# Patient Record
Sex: Male | Born: 1975 | Race: White | Marital: Married | Smoking: Never smoker
Health system: Southern US, Community
[De-identification: ages and names within clinical notes are randomized; demographics above are authoritative.]

## PROBLEM LIST (undated history)

## (undated) DIAGNOSIS — G4730 Sleep apnea, unspecified: Secondary | ICD-10-CM

## (undated) DIAGNOSIS — F458 Other somatoform disorders: Secondary | ICD-10-CM

## (undated) DIAGNOSIS — G4733 Obstructive sleep apnea (adult) (pediatric): Secondary | ICD-10-CM

## (undated) DIAGNOSIS — I10 Essential (primary) hypertension: Secondary | ICD-10-CM

## (undated) DIAGNOSIS — E291 Testicular hypofunction: Secondary | ICD-10-CM

## (undated) DIAGNOSIS — Z87442 Personal history of urinary calculi: Secondary | ICD-10-CM

## (undated) DIAGNOSIS — M722 Plantar fascial fibromatosis: Secondary | ICD-10-CM

## (undated) DIAGNOSIS — K5090 Crohn's disease, unspecified, without complications: Secondary | ICD-10-CM

## (undated) DIAGNOSIS — Z973 Presence of spectacles and contact lenses: Secondary | ICD-10-CM

## (undated) DIAGNOSIS — N201 Calculus of ureter: Secondary | ICD-10-CM

## (undated) DIAGNOSIS — Z8709 Personal history of other diseases of the respiratory system: Secondary | ICD-10-CM

## (undated) DIAGNOSIS — N200 Calculus of kidney: Secondary | ICD-10-CM

## (undated) DIAGNOSIS — J309 Allergic rhinitis, unspecified: Secondary | ICD-10-CM

## (undated) HISTORY — DX: Plantar fascial fibromatosis: M72.2

## (undated) HISTORY — PX: HAND SURGERY: SHX662

## (undated) HISTORY — DX: Other somatoform disorders: F45.8

## (undated) HISTORY — DX: Personal history of urinary calculi: Z87.442

## (undated) HISTORY — DX: Crohn's disease, unspecified, without complications: K50.90

## (undated) HISTORY — PX: TYMPANOSTOMY TUBE PLACEMENT: SHX32

## (undated) HISTORY — PX: NASAL SINUS SURGERY: SHX719

## (undated) HISTORY — DX: Essential (primary) hypertension: I10

## (undated) HISTORY — DX: Sleep apnea, unspecified: G47.30

## (undated) HISTORY — PX: ADENOIDECTOMY: SUR15

---

## 1999-08-28 ENCOUNTER — Encounter: Payer: Self-pay | Admitting: Emergency Medicine

## 1999-08-28 ENCOUNTER — Emergency Department (HOSPITAL_COMMUNITY): Admission: EM | Admit: 1999-08-28 | Discharge: 1999-08-28 | Payer: Self-pay | Admitting: Emergency Medicine

## 1999-09-18 ENCOUNTER — Ambulatory Visit (HOSPITAL_COMMUNITY): Admission: RE | Admit: 1999-09-18 | Discharge: 1999-09-18 | Payer: Self-pay | Admitting: Orthopedic Surgery

## 1999-09-19 ENCOUNTER — Encounter: Payer: Self-pay | Admitting: Orthopedic Surgery

## 1999-09-23 ENCOUNTER — Ambulatory Visit (HOSPITAL_COMMUNITY): Admission: RE | Admit: 1999-09-23 | Discharge: 1999-09-23 | Payer: Self-pay | Admitting: Orthopedic Surgery

## 1999-09-23 ENCOUNTER — Encounter: Payer: Self-pay | Admitting: Orthopedic Surgery

## 2000-06-23 ENCOUNTER — Emergency Department (HOSPITAL_COMMUNITY): Admission: EM | Admit: 2000-06-23 | Discharge: 2000-06-23 | Payer: Self-pay | Admitting: Emergency Medicine

## 2000-06-23 ENCOUNTER — Encounter: Payer: Self-pay | Admitting: Emergency Medicine

## 2001-10-04 ENCOUNTER — Emergency Department (HOSPITAL_COMMUNITY): Admission: EM | Admit: 2001-10-04 | Discharge: 2001-10-04 | Payer: Self-pay | Admitting: Emergency Medicine

## 2001-10-04 ENCOUNTER — Encounter: Payer: Self-pay | Admitting: Emergency Medicine

## 2006-06-04 ENCOUNTER — Ambulatory Visit: Payer: Self-pay | Admitting: Internal Medicine

## 2006-06-04 LAB — CONVERTED CEMR LAB
ALT: 29 units/L (ref 0–40)
AST: 25 units/L (ref 0–37)
Albumin: 3.6 g/dL (ref 3.5–5.2)
Alkaline Phosphatase: 85 units/L (ref 39–117)
BUN: 10 mg/dL (ref 6–23)
Basophils Absolute: 0 10*3/uL (ref 0.0–0.1)
Basophils Relative: 0.3 % (ref 0.0–1.0)
Bilirubin Urine: NEGATIVE
Bilirubin, Direct: 0.1 mg/dL (ref 0.0–0.3)
CO2: 30 meq/L (ref 19–32)
Calcium: 9.3 mg/dL (ref 8.4–10.5)
Chloride: 108 meq/L (ref 96–112)
Cholesterol: 261 mg/dL (ref 0–200)
Creatinine, Ser: 1 mg/dL (ref 0.4–1.5)
Direct LDL: 207.7 mg/dL
Eosinophils Absolute: 0.2 10*3/uL (ref 0.0–0.6)
Eosinophils Relative: 2.5 % (ref 0.0–5.0)
GFR calc Af Amer: 111 mL/min
GFR calc non Af Amer: 91 mL/min
Glucose, Bld: 98 mg/dL (ref 70–99)
HCT: 43.9 % (ref 39.0–52.0)
HDL: 37.7 mg/dL — ABNORMAL LOW (ref >39.0)
Hemoglobin, Urine: NEGATIVE
Hemoglobin: 15.5 g/dL (ref 13.0–17.0)
Ketones, ur: NEGATIVE mg/dL
Leukocytes, UA: NEGATIVE
Lymphocytes Relative: 36.1 % (ref 12.0–46.0)
MCHC: 35.2 g/dL (ref 30.0–36.0)
MCV: 82.3 fL (ref 78.0–100.0)
Monocytes Absolute: 0.3 10*3/uL (ref 0.2–0.7)
Monocytes Relative: 4.6 % (ref 3.0–11.0)
Neutro Abs: 3.7 10*3/uL (ref 1.4–7.7)
Neutrophils Relative %: 56.5 % (ref 43.0–77.0)
Nitrite: NEGATIVE
Platelets: 383 10*3/uL (ref 150–400)
Potassium: 4.5 meq/L (ref 3.5–5.1)
RBC: 5.33 M/uL (ref 4.22–5.81)
RDW: 12.1 % (ref 11.5–14.6)
Sodium: 141 meq/L (ref 135–145)
Specific Gravity, Urine: 1.025 (ref 1.000–1.03)
TSH: 1.53 microintl units/mL (ref 0.35–5.50)
Total Bilirubin: 0.8 mg/dL (ref 0.3–1.2)
Total CHOL/HDL Ratio: 6.9
Total Protein, Urine: NEGATIVE mg/dL
Total Protein: 7.5 g/dL (ref 6.0–8.3)
Triglycerides: 101 mg/dL (ref 0–149)
Urine Glucose: NEGATIVE mg/dL
Urobilinogen, UA: 0.2 (ref 0.0–1.0)
VLDL: 20 mg/dL (ref 0–40)
WBC: 6.6 10*3/uL (ref 4.5–10.5)
pH: 6.5 (ref 5.0–8.0)

## 2006-09-03 ENCOUNTER — Ambulatory Visit: Payer: Self-pay | Admitting: Internal Medicine

## 2007-01-07 ENCOUNTER — Ambulatory Visit: Payer: Self-pay | Admitting: Internal Medicine

## 2007-08-13 ENCOUNTER — Emergency Department (HOSPITAL_COMMUNITY): Admission: EM | Admit: 2007-08-13 | Discharge: 2007-08-14 | Payer: Self-pay | Admitting: Emergency Medicine

## 2007-10-26 ENCOUNTER — Encounter: Payer: Self-pay | Admitting: Internal Medicine

## 2007-10-26 ENCOUNTER — Telehealth: Payer: Self-pay | Admitting: Internal Medicine

## 2007-10-26 DIAGNOSIS — J309 Allergic rhinitis, unspecified: Secondary | ICD-10-CM | POA: Insufficient documentation

## 2007-10-26 DIAGNOSIS — I10 Essential (primary) hypertension: Secondary | ICD-10-CM | POA: Insufficient documentation

## 2007-12-08 ENCOUNTER — Ambulatory Visit: Payer: Self-pay | Admitting: Internal Medicine

## 2007-12-08 DIAGNOSIS — R51 Headache: Secondary | ICD-10-CM | POA: Insufficient documentation

## 2007-12-08 DIAGNOSIS — R519 Headache, unspecified: Secondary | ICD-10-CM | POA: Insufficient documentation

## 2007-12-08 DIAGNOSIS — D485 Neoplasm of uncertain behavior of skin: Secondary | ICD-10-CM | POA: Insufficient documentation

## 2007-12-10 LAB — CONVERTED CEMR LAB
ALT: 25 units/L (ref 0–53)
AST: 26 units/L (ref 0–37)
Albumin: 3.9 g/dL (ref 3.5–5.2)
Alkaline Phosphatase: 88 units/L (ref 39–117)
BUN: 14 mg/dL (ref 6–23)
Basophils Absolute: 0 10*3/uL (ref 0.0–0.1)
Basophils Relative: 0.7 % (ref 0.0–3.0)
Bilirubin Urine: NEGATIVE
Bilirubin, Direct: 0.1 mg/dL (ref 0.0–0.3)
CO2: 28 meq/L (ref 19–32)
Calcium: 9.4 mg/dL (ref 8.4–10.5)
Chloride: 107 meq/L (ref 96–112)
Cholesterol: 244 mg/dL (ref 0–200)
Creatinine, Ser: 1.1 mg/dL (ref 0.4–1.5)
Direct LDL: 196 mg/dL
Eosinophils Absolute: 0.1 10*3/uL (ref 0.0–0.7)
Eosinophils Relative: 1.8 % (ref 0.0–5.0)
GFR calc Af Amer: 99 mL/min
GFR calc non Af Amer: 81 mL/min
Glucose, Bld: 105 mg/dL — ABNORMAL HIGH (ref 70–99)
HCT: 42.6 % (ref 39.0–52.0)
HDL: 31.8 mg/dL — ABNORMAL LOW (ref >39.0)
Hemoglobin, Urine: NEGATIVE
Hemoglobin: 14.7 g/dL (ref 13.0–17.0)
Ketones, ur: NEGATIVE mg/dL
Leukocytes, UA: NEGATIVE
Lymphocytes Relative: 36.4 % (ref 12.0–46.0)
MCHC: 34.5 g/dL (ref 30.0–36.0)
MCV: 82.6 fL (ref 78.0–100.0)
Monocytes Absolute: 0.3 10*3/uL (ref 0.1–1.0)
Monocytes Relative: 5.2 % (ref 3.0–12.0)
Neutro Abs: 3.6 10*3/uL (ref 1.4–7.7)
Neutrophils Relative %: 55.9 % (ref 43.0–77.0)
Nitrite: NEGATIVE
Platelets: 387 10*3/uL (ref 150–400)
Potassium: 4 meq/L (ref 3.5–5.1)
RBC: 5.16 M/uL (ref 4.22–5.81)
RDW: 12.2 % (ref 11.5–14.6)
Sodium: 143 meq/L (ref 135–145)
Specific Gravity, Urine: >=1.03 (ref 1.000–1.03)
TSH: 1.57 microintl units/mL (ref 0.35–5.50)
Total Bilirubin: 0.9 mg/dL (ref 0.3–1.2)
Total CHOL/HDL Ratio: 7.7
Total Protein, Urine: NEGATIVE mg/dL
Total Protein: 7.9 g/dL (ref 6.0–8.3)
Triglycerides: 90 mg/dL (ref 0–149)
Urine Glucose: NEGATIVE mg/dL
Urobilinogen, UA: 0.2 (ref 0.0–1.0)
VLDL: 18 mg/dL (ref 0–40)
WBC: 6.3 10*3/uL (ref 4.5–10.5)
pH: 5 (ref 5.0–8.0)

## 2008-01-25 ENCOUNTER — Telehealth: Payer: Self-pay | Admitting: Internal Medicine

## 2008-04-29 DIAGNOSIS — M722 Plantar fascial fibromatosis: Secondary | ICD-10-CM

## 2008-04-29 HISTORY — DX: Plantar fascial fibromatosis: M72.2

## 2008-05-30 ENCOUNTER — Ambulatory Visit: Payer: Self-pay | Admitting: Internal Medicine

## 2008-08-16 ENCOUNTER — Encounter: Payer: Self-pay | Admitting: Internal Medicine

## 2008-12-06 ENCOUNTER — Ambulatory Visit: Payer: Self-pay | Admitting: Internal Medicine

## 2008-12-06 DIAGNOSIS — M722 Plantar fascial fibromatosis: Secondary | ICD-10-CM | POA: Insufficient documentation

## 2008-12-06 DIAGNOSIS — H679 Otitis media in diseases classified elsewhere, unspecified ear: Secondary | ICD-10-CM | POA: Insufficient documentation

## 2008-12-08 ENCOUNTER — Telehealth: Payer: Self-pay | Admitting: Family Medicine

## 2008-12-08 ENCOUNTER — Telehealth: Payer: Self-pay | Admitting: Internal Medicine

## 2008-12-19 ENCOUNTER — Telehealth: Payer: Self-pay | Admitting: Internal Medicine

## 2008-12-20 ENCOUNTER — Telehealth: Payer: Self-pay | Admitting: Internal Medicine

## 2009-01-12 ENCOUNTER — Telehealth: Payer: Self-pay | Admitting: Internal Medicine

## 2009-06-05 ENCOUNTER — Telehealth: Payer: Self-pay | Admitting: Internal Medicine

## 2009-07-17 ENCOUNTER — Ambulatory Visit (HOSPITAL_BASED_OUTPATIENT_CLINIC_OR_DEPARTMENT_OTHER): Admission: RE | Admit: 2009-07-17 | Discharge: 2009-07-17 | Payer: Self-pay | Admitting: Otolaryngology

## 2009-07-17 HISTORY — PX: ETHMOIDECTOMY: SHX5197

## 2009-09-04 ENCOUNTER — Ambulatory Visit: Payer: Self-pay | Admitting: Internal Medicine

## 2009-09-04 DIAGNOSIS — B079 Viral wart, unspecified: Secondary | ICD-10-CM | POA: Insufficient documentation

## 2009-09-04 DIAGNOSIS — R5383 Other fatigue: Secondary | ICD-10-CM | POA: Insufficient documentation

## 2009-09-04 DIAGNOSIS — R635 Abnormal weight gain: Secondary | ICD-10-CM | POA: Insufficient documentation

## 2009-09-04 DIAGNOSIS — R002 Palpitations: Secondary | ICD-10-CM | POA: Insufficient documentation

## 2009-09-04 DIAGNOSIS — R5381 Other malaise: Secondary | ICD-10-CM | POA: Insufficient documentation

## 2009-09-04 DIAGNOSIS — A630 Anogenital (venereal) warts: Secondary | ICD-10-CM | POA: Insufficient documentation

## 2009-09-04 LAB — CONVERTED CEMR LAB
ALT: 25 units/L (ref 0–53)
AST: 22 units/L (ref 0–37)
Albumin: 3.7 g/dL (ref 3.5–5.2)
Alkaline Phosphatase: 77 units/L (ref 39–117)
BUN: 16 mg/dL (ref 6–23)
Basophils Absolute: 0 10*3/uL (ref 0.0–0.1)
Basophils Relative: 0.6 % (ref 0.0–3.0)
Chloride: 107 meq/L (ref 96–112)
Eosinophils Relative: 3.1 % (ref 0.0–5.0)
GFR calc non Af Amer: 132.76 mL/min (ref >60)
HCT: 42.4 % (ref 39.0–52.0)
Hemoglobin, Urine: NEGATIVE
Hemoglobin: 14.5 g/dL (ref 13.0–17.0)
Ketones, ur: NEGATIVE mg/dL
Lymphocytes Relative: 34.2 % (ref 12.0–46.0)
Lymphs Abs: 2.4 10*3/uL (ref 0.7–4.0)
Monocytes Relative: 7.1 % (ref 3.0–12.0)
Neutro Abs: 3.8 10*3/uL (ref 1.4–7.7)
Potassium: 4.3 meq/L (ref 3.5–5.1)
RBC: 5.05 M/uL (ref 4.22–5.81)
RDW: 13.4 % (ref 11.5–14.6)
Sed Rate: 10 mm/hr (ref 0–22)
Sodium: 142 meq/L (ref 135–145)
Total Protein, Urine: NEGATIVE mg/dL
Total Protein: 7.3 g/dL (ref 6.0–8.3)
Urine Glucose: NEGATIVE mg/dL
Urobilinogen, UA: 0.2 (ref 0.0–1.0)
Vitamin B-12: 443 pg/mL (ref 211–911)
WBC: 6.9 10*3/uL (ref 4.5–10.5)

## 2009-09-05 LAB — CONVERTED CEMR LAB: Vit D, 25-Hydroxy: 21 ng/mL — ABNORMAL LOW (ref 30–89)

## 2009-09-06 ENCOUNTER — Ambulatory Visit: Payer: Self-pay | Admitting: Pulmonary Disease

## 2009-10-02 ENCOUNTER — Ambulatory Visit: Payer: Self-pay | Admitting: Psychology

## 2009-10-04 ENCOUNTER — Encounter: Payer: Self-pay | Admitting: Pulmonary Disease

## 2009-10-04 ENCOUNTER — Ambulatory Visit (HOSPITAL_BASED_OUTPATIENT_CLINIC_OR_DEPARTMENT_OTHER): Admission: RE | Admit: 2009-10-04 | Discharge: 2009-10-04 | Payer: Self-pay | Admitting: Pulmonary Disease

## 2009-10-05 ENCOUNTER — Ambulatory Visit: Payer: Self-pay | Admitting: Pulmonary Disease

## 2009-10-16 ENCOUNTER — Ambulatory Visit: Payer: Self-pay | Admitting: Pulmonary Disease

## 2009-10-16 DIAGNOSIS — G4733 Obstructive sleep apnea (adult) (pediatric): Secondary | ICD-10-CM | POA: Insufficient documentation

## 2009-10-16 DIAGNOSIS — F458 Other somatoform disorders: Secondary | ICD-10-CM | POA: Insufficient documentation

## 2009-11-13 ENCOUNTER — Ambulatory Visit: Payer: Self-pay | Admitting: Internal Medicine

## 2009-11-13 DIAGNOSIS — L219 Seborrheic dermatitis, unspecified: Secondary | ICD-10-CM | POA: Insufficient documentation

## 2009-12-05 ENCOUNTER — Encounter: Payer: Self-pay | Admitting: Pulmonary Disease

## 2009-12-26 ENCOUNTER — Telehealth: Payer: Self-pay | Admitting: Pulmonary Disease

## 2010-01-09 ENCOUNTER — Telehealth: Payer: Self-pay | Admitting: Internal Medicine

## 2010-01-11 ENCOUNTER — Encounter: Payer: Self-pay | Admitting: Pulmonary Disease

## 2010-01-16 ENCOUNTER — Encounter (INDEPENDENT_AMBULATORY_CARE_PROVIDER_SITE_OTHER): Payer: Self-pay | Admitting: *Deleted

## 2010-01-16 ENCOUNTER — Telehealth (INDEPENDENT_AMBULATORY_CARE_PROVIDER_SITE_OTHER): Payer: Self-pay | Admitting: *Deleted

## 2010-02-13 ENCOUNTER — Telehealth: Payer: Self-pay | Admitting: Pulmonary Disease

## 2010-02-20 ENCOUNTER — Telehealth: Payer: Self-pay | Admitting: Internal Medicine

## 2010-02-21 ENCOUNTER — Ambulatory Visit: Payer: Self-pay | Admitting: Psychology

## 2010-02-22 ENCOUNTER — Telehealth: Payer: Self-pay | Admitting: Pulmonary Disease

## 2010-03-05 ENCOUNTER — Ambulatory Visit: Payer: Self-pay | Admitting: Psychology

## 2010-04-10 ENCOUNTER — Telehealth: Payer: Self-pay | Admitting: Internal Medicine

## 2010-04-16 ENCOUNTER — Encounter: Payer: Self-pay | Admitting: Sports Medicine

## 2010-04-16 ENCOUNTER — Ambulatory Visit: Payer: Self-pay | Admitting: Internal Medicine

## 2010-04-20 ENCOUNTER — Encounter (INDEPENDENT_AMBULATORY_CARE_PROVIDER_SITE_OTHER): Payer: Self-pay | Admitting: *Deleted

## 2010-04-29 HISTORY — PX: COLONOSCOPY: SHX174

## 2010-05-08 ENCOUNTER — Ambulatory Visit: Admit: 2010-05-08 | Payer: Self-pay | Admitting: Sports Medicine

## 2010-05-29 NOTE — Progress Notes (Signed)
----   Converted from flag ---- ---- 01/16/2010 7:38 AM, Evie Lacks Plotnikov MD wrote: ok Thx  ---- 01/12/2010 11:01 AM, Ophelia Charter wrote: Dr Wilhemina Bonito is scheduling into nov, can pt wait until then?  ---- 01/11/2010 12:44 PM, Evie Lacks Plotnikov MD wrote: The following orders have been entered for this patient and placed on Admin Hold:  Type:     Referral       Code:   Derma Description:   Dermatology Referral Order Date:   01/09/2010   Authorized By:   Cassandria Anger MD Order #:   [id number] Clinical Notes:   Dr Wilhemina Bonito Dx rash ------------------------------

## 2010-05-29 NOTE — Progress Notes (Signed)
Summary: REFERRAL  Phone Note Other Incoming   Caller: Pt Summary of Call: Pt is requesting ref to a dermatologist, Called pt to see what he needs ref for. lmovm for pt to call back Initial call taken by: Ami Bullins CMA,  January 09, 2010 8:54 AM  Follow-up for Phone Call        Left vm for pt to call office back w/details of referral Follow-up by: Charlsie Quest, CMA,  January 09, 2010 6:18 PM  Additional Follow-up for Phone Call Additional follow up Details #1::        Patient is requesting a referal to the "Best dermatologist in [place]" He has been to a dermatologist many years ago for frequent "skin issues" Last office visit he was given rx for cream for dermatitis but wants dermatologist. Please advise.  Additional Follow-up by: Charlsie Quest, CMA,  January 10, 2010 6:55 PM    Additional Follow-up for Phone Call Additional follow up Details #2::    ok Follow-up by: Cassandria Anger MD,  January 11, 2010 12:43 PM

## 2010-05-29 NOTE — Progress Notes (Signed)
Summary: nos appt  Phone Note Call from Patient   Caller: [email or web address]  Call For: sood Summary of Call: Rsc nos from 8/29 to 10/17 @ 3:45p. Initial call taken by: Netta Neat,  December 26, 2009 10:24 AM

## 2010-05-29 NOTE — Progress Notes (Signed)
Summary: nos appt  Phone Note Call from Patient   Caller: [email or web address]  Call For: sood Summary of Call: Rsc nos from 10/17 to 10/26. Initial call taken by: Netta Neat,  February 13, 2010 9:57 AM

## 2010-05-29 NOTE — Progress Notes (Signed)
Summary: MICARDIS  Phone Note Call from Patient Call back at Home Phone [phone]   Caller: Patient Call For: Cassandria Anger MD Summary of Call: Pt states he went to his pharmacy [facility] on [address] does NOT have Micardis ready, pt is requesting this be re-sent as it is not ready. Initial call taken by: Denice Paradise,  June 05, 2009 1:06 PM  Follow-up for Phone Call        Called pt and pharm, pharm will fill rx Follow-up by: Charlsie Quest, [place],  June 05, 2009 2:10 PM

## 2010-05-29 NOTE — Assessment & Plan Note (Signed)
Summary: WAKING UP AT NIGHT-HEADACHE FATIGUE-STC   Vital Signs:  Patient profile:   35 year old male Height:      73 inches Weight:      276.50 pounds BMI:     36.61 O2 Sat:      98 % on Room air Temp:     97.6 degrees F oral Pulse rate:   77 / minute BP sitting:   138 / 92  (right arm) Cuff size:   large  Vitals Entered By: Ernestene Mention (Sep 04, 2009 8:16 AM)  O2 Flow:  Room air CC: C/O waking up 8-10x per night. Pt states he will awaken with HA and later go back to sleep./kb Is Patient Diabetic? No Pain Assessment Patient in pain? no        Primary Care Provider:  Evie Lacks Plotnikov MD  CC:  C/O waking up 8-10x per night. Pt states he will awaken with HA and later go back to sleep./kb.  History of Present Illness: C/o fatigued, snoring F had PTSD - [facility] C/o penile wart  Current Medications (verified): 1)  Micardis Hct 40-12.5 Mg Tabs (Telmisartan-Hctz) .[name].. 1 By Mouth Qd 2)  Condylox 0.5 % Gel (Podofilox) .... Apply To Skin Lesion Once Daily For 14 Days 3)  Augmentin 500-125 Mg Tabs (Amoxicillin-Pot Clavulanate) .... One By Mouth Three Times A Day For 10 Days 4)  Auralgan  Soln (Antipyrine-Benzocaine-Polycos) .... 2 Drops Into Left Ear Three Times A Day As Needed For Pain 5)  Lomotil 2.5-0.025 Mg Tabs (Diphenoxylate-Atropine) .[name].. 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    Physical Exam  General:  alert,  well-developed, well-nourished, well-hydrated, and good hygiene.   Nose:  nasal dischargemucosal pallor.   Mouth:  Oral mucosa and oropharynx without lesions or exudates.  Teeth in good repair. Neck:  No deformities, masses, or tenderness noted. Lungs:  Normal respiratory effort, chest expands symmetrically. Lungs are clear to auscultation, no crackles or wheezes. Heart:  Normal rate and regular rhythm. S1 and S2 normal without gallop, murmur, click, rub or other extra sounds. Abdomen:  Bowel sounds positive,abdomen soft and non-tender without masses, organomegaly or hernias noted. Genitalia:  penile wart Msk:  No deformity or scoliosis noted of thoracic or lumbar spine.   Extremities:  No clubbing, cyanosis, edema, or deformity noted with normal full range of motion of all joints.   Skin:  he has mild patches of sclaing and hypopigmentation on his face. Psych:  Cognition and judgment appear intact. Alert and cooperative with normal attention span and concentration. No apparent delusions, illusions, hallucinations   Impression & Recommendations:  Problem # 1:  FATIGUE (ICD-780.79) Assessment New R/o OSA Orders: Sleep Disorder Referral (Sleep Disorder)  Problem # 2:  SNORING (ICD-786.09) Assessment: Deteriorated  The following medications were removed from the medication list:    Micardis Hct 40-12.5 Mg Tabs (Telmisartan-hctz) .[name]... 1 by mouth qd His updated medication list for this problem includes:    Losartan Potassium-hctz 100-25 Mg Tabs (Losartan potassium-hctz) .Marland KitchenMarland KitchenMarland KitchenMarland Kitchen  1 by mouth once daily for blood pressure  Orders: TLB-BMP (Basic Metabolic Panel-BMET) (83818-MCRFVOH) TLB-B12, Serum-Total ONLY (60677-C34) TLB-CBC Platelet - w/Differential (85025-CBCD) TLB-Hepatic/Liver Function Pnl (80076-HEPATIC) TLB-Sedimentation Rate (ESR) (85652-ESR) TLB-TSH (Thyroid Stimulating Hormone) (84443-TSH) T-Vitamin D (25-Hydroxy) (03524-81859) TLB-Udip ONLY (81003-UDIP) Sleep Disorder  Referral (Sleep Disorder)  Problem # 3:  PALPITATIONS (ICD-785.1) Assessment: Comment Only  Orders: TLB-BMP (Basic Metabolic Panel-BMET) (09311-[id number]) TLB-B12, Serum-Total ONLY (95072-U57) TLB-CBC Platelet - w/Differential (85025-CBCD) TLB-Hepatic/Liver Function Pnl (80076-HEPATIC) TLB-Sedimentation Rate (ESR) (85652-ESR) TLB-TSH (Thyroid Stimulating Hormone) (84443-TSH) T-Vitamin D (25-Hydroxy) (50518-33582) TLB-Udip ONLY (81003-UDIP) EKG w/ Interpretation (93000)  Problem # 4:  WEIGHT GAIN, ABNORMAL (ICD-783.1) Assessment: Deteriorated See "Patient Instructions".  Orders: TLB-BMP (Basic Metabolic Panel-BMET) ([id number]) TLB-B12, Serum-Total ONLY (81188-Q77) TLB-CBC Platelet - w/Differential (85025-CBCD) TLB-Hepatic/Liver Function Pnl (80076-HEPATIC) TLB-Sedimentation Rate (ESR) (85652-ESR) TLB-TSH (Thyroid Stimulating Hormone) (84443-TSH) T-Vitamin D (25-Hydroxy) (37366-81594) TLB-Udip ONLY (81003-UDIP)  Problem # 5:  WART, VIRAL (ICD-078.10) penile Assessment: New  Orders: Wart Destruct <14 (17110) Procedure: cryo Indication: wart(s) x 1 6x4 mm on distal R penile shaft skin Risks incl. scar(s), incomplete removal, ect.  and benefits discussed     1  lesion(s) on the above area was/were treated with liqid N2 in usual fasion.  Tolerated well. Compl. none. Wound care instructions given.  Problem # 6:  STRESS DISORDER (ICD-V62.89) Assessment: Comment Only  Orders: Psychology Referral (Psychology) [facility]  Complete Medication List: 1)  Losartan Potassium-hctz 100-25 Mg Tabs (Losartan potassium-hctz) .[name].. 1 by mouth once daily for blood pressure 2)  Vitamin D 1000 Unit Tabs (Cholecalciferol) .[name].. 1 by mouth qd 3)  Ketoconazole 2 % Crea (Ketoconazole) .... Use bid  Patient Instructions: 1)  Please schedule a follow-up appointment in 2 months. 2)  Try to eat more raw plant food, fresh and dry fruit, raw almonds, leafy vegetables, whole foods and less red  meat, less animal fat. Poultry and fish is better for you than pork and beef. Avoid processed foods (canned soups, hot dogs, sausage, bacon , frozen dinners). Avoid corn syrup, high fructose syrup or aspartam  Make your own  dressing with olive oil, wine vinegar, lemon juce, garlic etc. for your salads.  3)  Loose 10 lbs Prescriptions: KETOCONAZOLE 2 % CREA (KETOCONAZOLE) use bid  #90 g x 3   Entered and Authorized by:   Cassandria Anger MD   Signed by:   Cassandria Anger MD on 09/04/2009   Method used:   Print then Give to Patient   RxID:   [id number] VITAMIN D (ERGOCALCIFEROL) 50000 UNIT CAPS (ERGOCALCIFEROL) 1 by mouth q 1 week x 6 weeks then start Vit D 1000 international units qd  #6 x 0   Entered and Authorized by:   Cassandria Anger MD   Signed by:   Cassandria Anger MD on 09/04/2009   Method used:   Print then Give to Patient   RxID:   [id number] LOSARTAN POTASSIUM-HCTZ 100-25 MG TABS (LOSARTAN POTASSIUM-HCTZ) 1 by mouth once daily for blood pressure  #90 x 3   Entered and Authorized by:   Cassandria Anger MD   Signed by:   Cassandria Anger MD on 09/04/2009   Method used:   Print then Give to Patient   RxID:   [id number]

## 2010-05-29 NOTE — Progress Notes (Signed)
Summary: RX?  Phone Note Call from Patient Call back at [facility] Phone [phone]   Summary of Call: Pt feels that he has acid reflux and wants suggestion from MD Initial call taken by: Charlsie Quest, [place],  February 20, 2010 2:12 PM  Follow-up for Phone Call        OTC Prilosec 1 a day Avoid greasy food OV if problems Follow-up by: Cassandria Anger MD,  February 20, 2010 5:53 PM  Additional Follow-up for Phone Call Additional follow up Details #1::        Pt informed  Additional Follow-up by: Charlsie Quest, [place],  February 21, 2010 11:08 AM

## 2010-05-29 NOTE — Assessment & Plan Note (Signed)
Summary: 2 MO ROV/ NWS   Vital Signs:  Patient profile:   35 year old male Height:      73 inches (185.42 cm) Weight:      273 pounds (124.09 kg) BMI:     36.15 O2 Sat:      97 % on Room air Temp:     98.2 degrees F (36.78 degrees C) oral Pulse rate:   96 / minute Pulse rhythm:   regular Resp:     16 per minute BP sitting:   122 / 82  (left arm) Cuff size:   large  Vitals Entered By: Jonathon Resides, CMA(AAMA) (November 13, 2009 10:43 AM)  O2 Flow:  Room air CC: 2 mo f/u Is Patient Diabetic? No   Primary Care Provider:  Cassandria Anger MD  CC:  2 mo f/u.  History of Present Illness: F/u OSA, penile wart, HTN C/o scaly rash on face off and on x years  Current Medications (verified): 1)  Losartan Potassium-Hctz 100-25 Mg Tabs (Losartan Potassium-Hctz) .[name].. 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    Physical Exam  General:  alert, well-developed, well-nourished, well-hydrated, and good hygiene.   Ears:  R ear normal, L TM erythema, and L TM bulging.   Mouth:  Oral mucosa and oropharynx without lesions or exudates.  Teeth in good repair. Neck:  No deformities, masses, or tenderness noted. Lungs:  Normal respiratory effort, chest expands symmetrically. Lungs are clear to auscultation, no crackles or wheezes. Heart:  Normal rate and regular rhythm. S1 and S2 normal without gallop, murmur, click, rub or other extra sounds. Abdomen:  Bowel sounds positive,abdomen soft and non-tender without  masses, organomegaly or hernias noted. Msk:  No deformity or scoliosis noted of thoracic or lumbar spine.   Extremities:  No clubbing, cyanosis, edema, or deformity noted with normal full range of motion of all joints.   Skin:  he has mild patches of sclaing and hypopigmentation on his face. Psych:  Cognition and judgment appear intact. Alert and cooperative with normal attention span and concentration. No apparent delusions, illusions, hallucinations   Impression & Recommendations:  Problem # 1:  SEBORRHEIC DERMATITIS (ICD-690.10) Assessment New Lotrisone cream  Problem # 2:  FATIGUE (ICD-780.79) Assessment: Improved  Problem # 3:  HYPERTENSION (ICD-401.9) Assessment: New  The following medications were removed from the medication list:    Losartan Potassium-hctz 100-25 Mg Tabs (Losartan potassium-hctz) .[name]... 1 by mouth once daily for blood pressure His updated medication list for this problem includes:    Cozaar 100 Mg Tabs (Losartan potassium) .[name]... 1 by mouth qd  Problem # 4:  OBSTRUCTIVE SLEEP APNEA (ICD-327.23) Assessment: Improved CPAP  Problem # 5:  WART, VIRAL (ICD-078.10) Assessment: Improved  Complete Medication List: 1)  Cozaar 100 Mg Tabs (Losartan potassium) .[name].. 1 by mouth qd 2)  Vitamin D (ergocalciferol) 50000 Unit Caps (Ergocalciferol) .[name].. 1 by mouth q 1 week x 6 weeks then start vit d 1000 international units qd 3)  Vitamin D 1000 Unit Tabs (Cholecalciferol) .[name].. 1 by mouth  qd 4)  Cialis 20 Mg Tabs (Tadalafil) .... 1/2 or 1 by mouth q 1-3 d prn 5)  Lotrisone 1-0.05 % Crea (Clotrimazole-betamethasone) .... Use bid  Patient Instructions: 1)  Please schedule a follow-up appointment in 4 months. Prescriptions: LOTRISONE 1-0.05 % CREA (CLOTRIMAZOLE-BETAMETHASONE) use bid  #90 g x 1   Entered and Authorized by:   Cassandria Anger MD   Signed by:   Cassandria Anger MD on 11/13/2009   Method used:   Print then Give to Patient   RxID:    [id number] COZAAR 100 MG TABS (LOSARTAN POTASSIUM) 1 by mouth qd  #90 x 3   Entered and Authorized by:   Cassandria Anger MD   Signed by:   Cassandria Anger MD on 11/13/2009   Method used:   Print then Give to Patient   RxID:   [id number] CIALIS 20 MG TABS (TADALAFIL) 1/2 or 1 by mouth q 1-3 d prn  #6 x 12   Entered and Authorized by:   Cassandria Anger MD   Signed by:   Cassandria Anger MD on 11/13/2009   Method used:   Print then Give to Patient   RxID:   [id number] VITAMIN D (ERGOCALCIFEROL) 50000 UNIT CAPS (ERGOCALCIFEROL) 1 by mouth q 1 week x 6 weeks then start Vit D 1000 international units qd  #6 x 0   Entered and Authorized by:   Cassandria Anger MD   Signed by:   Cassandria Anger MD on 11/13/2009   Method used:   Print then Give to Patient   RxID:   [id number]

## 2010-05-29 NOTE — Assessment & Plan Note (Signed)
Summary: SNORING ///kp   Primary Provider/Referring Provider:  Cassandria Anger MD  CC:  Sleep consult, pt states his wife says he snores extremely bad and he stops breathing during the night, and pt state he wakes up with severe heacdaches in the AM.  History of Present Illness: 35 yo male for sleep evaluation  He has always had problems with his sleep.  His wife has told him he snores, and stops breathing while asleep.  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  He goes to bed at 11pm and wakes up at 630 am.  He wakes up frequently with a start, and has heard himself snore.  He feels tired in the morning, and gets headaches in the morning.  He falls asleep easily when sitting quiet.    He has tried using hydrocodone to help him sleep.  He does not drink much alcohol, and does not smoke.  He drinks lots of soda during the day, and uses energy drinks also.  He has gained 25 lbs over the past year.  He denies sleep talking, sleep walking, or bruxism.  There is no history of restless legs.  He denies sleep hallucinations, sleep paralysis, or cataplexy.  His father had PTSD, and apparently there was some conflict in Mr. Zupko's relation with his father.  He gets frequent nigthmares about these interactions.  He has been scheduled to see a psychologist.   Epworth score is 20 out of 24.  Current Medications (verified): 1)  Losartan Potassium-Hctz 100-25 Mg Tabs (Losartan Potassium-Hctz) .[name].. 1 By Mouth Once Daily For Blood Pressure 2)  Vitamin D 1000 Unit Tabs (Cholecalciferol) .[name].. 1 By Mouth Qd 3)  Ketoconazole 2 % Crea (Ketoconazole) .... Use Bid  Allergies (verified): No Known Drug Allergies  Past History:  Past Medical History: Reviewed history from 10/26/2007 and no changes required. Allergic rhinitis Hypertension  Past Surgical History: Bilateral ethmoidectomy, Rt nasal polypectomy, Lt maxillary antrostomy  March 2011, Dr. Izora Gala Adenoidectomy  Family History: Reviewed history from 09/04/2009 and no changes required. Allergies Seasonal--Mother, Son, Daughter Breast Cancer--Mother Lung Cancer--father Asthma--Son  Social History: Reviewed history from 09/04/2009 and no changes required. 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  The patient denies shortness of breath with activity, shortness of breath at rest, productive cough, non-productive cough, coughing up blood, acid heartburn, indigestion, loss of appetite, abdominal pain, difficulty swallowing, sore throat, tooth/dental problems, nasal congestion/difficulty breathing through nose, sneezing, itching, ear ache, anxiety, depression, hand/feet swelling, joint stiffness or pain, rash, change in color of mucus, and fever.    Vital Signs:  Patient profile:   35 year old male Height:      73 inches (185.42 cm) Weight:      277 pounds (125.91 kg) BMI:     36.68 O2 Sat:      96 % on Room air Temp:     97.7 degrees F (36.50 degrees C) oral Pulse rate:   77 / minute BP sitting:   118 / 80  (right arm) Cuff size:   large  Vitals Entered By: Francesca Jewett CMA (Sep 06, 2009 9:05 AM)  O2 Flow:  Room air CC: Sleep consult, pt states his wife says he snores extremely  bad and he stops breathing during the night, pt state he wakes up with severe heacdaches in the AM Comments Meds and allergies updated [place]  Sep 06, 2009 9:05 AM    Physical Exam  General:  normal appearance, healthy appearing, and obese.   Eyes:  PERRLA and EOMI.   Nose:  no deformity, discharge, inflammation, or lesions Mouth:  MP 3, no exudate Neck:  no JVD.   Chest Wall:  no deformities noted Lungs:  clear bilaterally to auscultation and  percussion Heart:  regular rate and rhythm, S1, S2 without murmurs, rubs, gallops, or clicks Abdomen:  bowel sounds positive; abdomen soft and non-tender without masses, or organomegaly Msk:  no deformity or scoliosis noted with normal posture Pulses:  pulses normal Extremities:  no clubbing, cyanosis, edema, or deformity noted Neurologic:  CN II-XII grossly intact with normal reflexes, coordination, muscle strength and tone Cervical Nodes:  no significant adenopathy Psych:  depressed affect.     Impression & Recommendations:  Problem # 1:  SNORING (ICD-786.09) He has symptoms as well as physical findings suggestive of sleep apnea.  I explained the diagnosis of sleep apnea, and how this can affect his health.  Driving precautions were reviewed.  Discussed the importance of weight reduction.  Will schedule sleep study to further evaluate.  Problem # 2:  STRESS DISORDER (ICD-V62.89) I have advised him to keep his appointment with behavioral health.  Problem # 3:  PALPITATIONS (ICD-785.1) I have advised him to f/u with primary care to determine if he needs further cardiac evaluation.  Complete Medication List: 1)  Losartan Potassium-hctz 100-25 Mg Tabs (Losartan potassium-hctz) .[name].. 1 by mouth once daily for blood pressure 2)  Vitamin D 1000 Unit Tabs (Cholecalciferol) .[name].. 1 by mouth qd 3)  Ketoconazole 2 % Crea (Ketoconazole) .... Use bid  Other Orders: Consultation Level IV (49702) Sleep Disorder Referral (Sleep Disorder)  Patient Instructions: 1)  Will schedule sleep test 2)  Will call to schedule follow up after sleep test reviewed

## 2010-05-29 NOTE — Assessment & Plan Note (Signed)
Summary: discuss sleep results/LC   Visit Type:  Follow-up Primary Provider/Referring Provider:  Cassandria Anger MD  CC:  Patient here to follow-up on sleep results..  History of Present Illness: 35 yo male to review sleep test.  Sleep test from October 04, 2009: AHI 0.6, RDI 6.  REM effect.  Several episodes of bruxism.  He continues to have trouble with his sleep, and feeling sleepy during the day.    Current Medications (verified): 1)  Losartan Potassium-Hctz 100-25 Mg Tabs (Losartan Potassium-Hctz) .[name].. 1 By Mouth Once Daily For Blood Pressure  Allergies (verified): No Known Drug Allergies  Past History:  Past Medical History: Allergic rhinitis Hypertension Mild OSA      - RDI 6 from [facility] 10/04/09 Bruxism  Past Surgical History: Reviewed history from 09/06/2009 and no changes required. Bilateral ethmoidectomy, Rt nasal polypectomy, Lt maxillary antrostomy March 2011, Dr. Izora Gala Adenoidectomy  Vital Signs:  Patient profile:   35 year old male Height:      73 inches (185.42 cm) Weight:      275 pounds (125.00 kg) BMI:     36.41 O2 Sat:      93 % on Room air Temp:     98.6 degrees F (37.00 degrees C) oral Pulse rate:   89 / minute BP sitting:   114 / 70  (left arm) Cuff size:   large  Vitals Entered By: Francesca Jewett CMA (October 16, 2009 11:06 AM)  O2 Sat at Rest %:  93 O2 Flow:  Room air CC: Patient here to follow-up on sleep results. Comments Medications reviewed. Daytime phone verified. Francesca Jewett CMA  October 16, 2009 11:15 AM   Physical Exam  General:  normal appearance, healthy appearing, and obese.   Nose:  no deformity, discharge, inflammation, or lesions Mouth:  MP 3, no exudate Neck:  no JVD.   Lungs:  clear bilaterally to auscultation and percussion Heart:  regular rate and rhythm, S1, S2 without murmurs, rubs, gallops, or clicks Extremities:  no clubbing, cyanosis, edema, or deformity noted Cervical Nodes:  no significant  adenopathy   Impression & Recommendations:  Problem # 1:  OBSTRUCTIVE SLEEP APNEA (ICD-327.23) He has mild sleep apnea.  He has a history of hypertension.  I have discussed his results.  Explained how this can affect his health.  Driving precautions were reviewed.  Discussed treatment options.  Will arrange for auto CPAP at home.  If this is unsuccessful, then he will need an in-lab titration.  He is to check with his insurance company about whether he has coverage for an oral appliance.  I have also advised him to try positional therapy.  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  Problem # 3:  STRESS DISORDER (ICD-V62.89) He is to follow up with behavioral health for further evaluation for possible PTSD and anxiety.  Problem # 4:  WEIGHT GAIN, ABNORMAL (ICD-783.1) Explained to him how his weight is affecting his sleep and his health.  Complete Medication List: 1)  Losartan Potassium-hctz 100-25 Mg Tabs (Losartan potassium-hctz) .[name].. 1 by mouth once daily for blood pressure  Other Orders: Est. Patient Level III (93570) DME Referral (DME)  Patient Instructions: 1)  You have mild sleep apnea and bruxism 2)  Will set up CPAP machine at home 3)  You can check with your insurance company about whether they will cover oral appliance for sleep apnea 4)  Let your dentist know that you have bruxism (grinding your teeth while asleep) 5)  Follow up in 6 to 8 weeks

## 2010-05-29 NOTE — Letter (Signed)
Summary: [facility] Consult Scheduled Letter  [facility]   [place], [place] [address]   Phone: [phone]  Fax: [phone]      01/16/2010 MRN: [id number]  DUSTY RACZKOWSKI [address] [place], [place]  [address]    Dear Mr. POSADAS,      We have scheduled an appointment for you.  At the recommendation of Dr.Plotnikov, we have scheduled you a consult with Dr Wilhemina Bonito on 03/26/10 at 9:00am.  Their phone number is [phone].  If this appointment day and time is not convenient for you, please feel free to call the office of the doctor you are being referred to at the number listed above and reschedule the appointment.    [facility] [address], [place] [address]    Thank you,  Patient Care Coordinator [facility]

## 2010-05-29 NOTE — Progress Notes (Signed)
Summary: nos appt  Phone Note Call from Patient   Caller: [email or web address]  Call For: Kaitlynne Wenz Summary of Call: LMTCB x2 to rsc nos from 10/26. Initial call taken by: Netta Neat,  February 22, 2010 3:33 PM

## 2010-05-29 NOTE — Miscellaneous (Signed)
Summary: Polysomnogram report  Clinical Lists Changes AHI 0.6, RDI 6.  REM effect.  Several episodes of bruxism.  Will have my nurse call to schedule next available ROV to discuss results.  Appended Document: Polysomnogram report Patient is sch to see Dr. Halford Chessman on Monday, 10/16/2009 @ 9am to discuss sleep study results.

## 2010-05-31 NOTE — Letter (Signed)
Summary: [facility] Consult Scheduled Letter  [facility]   [place], [place] [address]   Phone: [phone]  Fax: [phone]      04/20/2010 MRN: [id number]  Casey Kelly [address] [place], [place]  [address]    Dear [name],      We have scheduled an appointment for you.  At the recommendation of Dr.Plotnikov, we have scheduled you a consult with Dr Oneida Alar on 05/08/10 at 10:30am.  Their phone number is [phone].  If this appointment day and time is not convenient for you, please feel free to call the office of the doctor you are being referred to at the number listed above and reschedule the appointment.    [facility] [place], [place] [address]   Thank you,  Patient Care Coordinator  [facility]

## 2010-05-31 NOTE — Assessment & Plan Note (Signed)
Summary: FU PER TRIAGE/NWS   #   Vital Signs:  Patient profile:   35 year old male Height:      73 inches Weight:      282 pounds BMI:     37.34 Temp:     98.2 degrees F oral Pulse rate:   84 / minute Pulse rhythm:   regular Resp:     16 per minute BP sitting:   146 / 84  (left arm) Cuff size:   large  Vitals Entered By: Jonathon Resides, Gabrielle Dare) (April 16, 2010 8:33 AM) CC: f/u Is Patient Diabetic? No   CC:  f/u.  Current Medications (verified): 1)  Cozaar 100 Mg Tabs (Losartan Potassium) .[name].. 1 By Mouth Qd 2)  Vitamin D (Ergocalciferol) 50000 Unit Caps (Ergocalciferol) .[name].. 1 By Mouth Q 1 Week X 6 Weeks Then Start Vit D 1000 International Units Qd 3)  Vitamin D 1000 Unit Tabs (Cholecalciferol) .[name].. 1 By Mouth Qd 4)  Cialis 20 Mg Tabs (Tadalafil) .... 1/2 or 1 By Mouth Q 1-3 D Prn  Allergies (verified): 1)  Hydrochlorothiazide  Past History:  Past Medical History: Allergic rhinitis Hypertension Mild OSA      - RDI 6 from PSG 10/04/09 Bruxism Plantar fasciitis 2010  Physical Exam  General:  alert, well-developed, well-nourished, well-hydrated, and good hygiene.   Nose:  nasal dischargemucosal pallor.   Mouth:  Oral mucosa and oropharynx without lesions or exudates.  Teeth in good repair. Neck:  No deformities, masses, or tenderness noted. Lungs:  Normal respiratory effort, chest expands symmetrically. Lungs are clear to auscultation, no crackles or wheezes. Heart:  Normal rate and regular rhythm. S1 and S2 normal without gallop, murmur, click, rub or other extra sounds. Abdomen:  Bowel sounds positive,abdomen soft and non-tender without masses, organomegaly or hernias noted. Genitalia:  A cluster of 3 penile warts on R shaft Msk:  No deformity or scoliosis noted of thoracic or lumbar spine.  B heels are tender Extremities:  No clubbing, cyanosis, edema, or deformity noted with normal full range of motion of all joints.   Skin:  he has mild patches of sclaing  and hypopigmentation on his face. scaly patches on B feet    Impression & Recommendations:  Problem # 1:  WEIGHT GAIN, ABNORMAL (ICD-783.1) Assessment New  Problem # 2:  PLANTAR FASCIITIS (ICD-728.71) Assessment: Unchanged  Sports med clinic consult Dr Oneida Alar His updated medication list for this problem includes:    Ibuprofen 600 Mg Tabs (Ibuprofen) .[name]... 1 by mouth bid  pc x 1 wk then as needed for  pain  Orders: Sports Medicine (Sports Med)  Problem # 3:  HYPERTENSION (ICD-401.9)  His updated medication list for this problem includes:    Cozaar 100 Mg Tabs (Losartan potassium) .[name]... 1 by mouth qd    Amlodipine Besylate 5 Mg Tabs (Amlodipine besylate) .[name]... 1 by mouth qd  Problem # 4:  WART, VIRAL (ICD-078.10) Assessment: New  Procedure: cryo Indication: gen wart(s) x 3 Risks incl. scar(s), incomplete removal, ect.  and benefits discussed    3  lesion(s) on R penile shaft was/were treated with liqid N2 in usual fasion.  Tolerated well. Compl. none. Wound care instructions given.   Orders: Wart Destruct <14 ([address]110)  Complete Medication List: 1)  Cozaar 100 Mg Tabs (Losartan potassium) .[name].. 1 by mouth qd 2)  Vitamin D (ergocalciferol) 50000 Unit Caps (Ergocalciferol) .[name].. 1 by mouth q 1 week x 6 weeks then start vit d 1000 international  units qd 3)  Vitamin D 1000 Unit Tabs (Cholecalciferol) .[name].. 1 by mouth qd 4)  Cialis 20 Mg Tabs (Tadalafil) .... 1/2 or 1 by mouth q 1-3 d prn 5)  Amlodipine Besylate 5 Mg Tabs (Amlodipine besylate) .[name].. 1 by mouth qd 6)  Ibuprofen 600 Mg Tabs (Ibuprofen) .[name].. 1 by mouth bid  pc x 1 wk then as needed for  pain 7)  Tramadol Hcl 50 Mg Tabs (Tramadol hcl) .[name].. 1-2 tabs by mouth two times a day as needed pain 8)  Lotrisone 1-0.05 % Crea (Clotrimazole-betamethasone) .... Use bid 9)  Phentermine Hcl 37.5 Mg Tabs (Phentermine hcl) .[name].. 1 by mouth qam  Patient Instructions: 1)  Normal BP 130/85 2)  Please schedule a follow-up appointment in  3 months. 3)  Cut back on sodas and juces - drink water! Prescriptions: PHENTERMINE HCL 37.5 MG TABS (PHENTERMINE HCL) 1 by mouth qam  #30 x 2   Entered and Authorized by:   Cassandria Anger MD   Signed by:   Cassandria Anger MD on 04/16/2010   Method used:   Print then Give to Patient   RxID:   [id number] LOTRISONE 1-0.05 % CREA (CLOTRIMAZOLE-BETAMETHASONE) use bid  #90 g x 1   Entered and Authorized by:   Cassandria Anger MD   Signed by:   Cassandria Anger MD on 04/16/2010   Method used:   Print then Give to Patient   RxID:   [id number] TRAMADOL HCL 50 MG TABS (TRAMADOL HCL) 1-2 tabs by mouth two times a day as needed pain  #100 x 1   Entered and Authorized by:   Cassandria Anger MD   Signed by:   Cassandria Anger MD on 04/16/2010   Method used:   Print then Give to Patient   RxID:   [id number] IBUPROFEN 600 MG TABS (IBUPROFEN) 1 by mouth bid  pc x 1 wk then as needed for  pain  #60 x 3   Entered and Authorized by:   Cassandria Anger MD   Signed by:   Cassandria Anger MD on 04/16/2010   Method used:   Print then Give to Patient   RxID:   [id number] AMLODIPINE BESYLATE 5 MG TABS (AMLODIPINE BESYLATE) 1 by mouth qd  #90 x 3   Entered and Authorized by:   Cassandria Anger MD   Signed by:   Cassandria Anger MD on 04/16/2010   Method used:   Print then Give to Patient   RxID:   [id number] AMLODIPINE BESYLATE 5 MG TABS (AMLODIPINE BESYLATE) 1 by mouth qd  #30 x 12   Entered and Authorized by:   Cassandria Anger MD   Signed by:   Cassandria Anger MD on 04/16/2010   Method used:   Print then Give to Patient   RxID:   [id number]    Orders Added: 1)  Sports Medicine [Sports Med] 2)  Est. Patient Level IV [35597] 3)  Wart Destruct <14 [[address]110]  Appended Document: FU PER TRIAGE/NWS   # Wt gain. Phentermine short term to help plantar fasciitis. Risks vs benefits and controversies of a long/short term wt loss meds  use were discussed.

## 2010-05-31 NOTE — Progress Notes (Signed)
Summary: RF  Phone Note Call from Patient Call back at Home Phone [phone]   Summary of Call: Patient is requesting a call back regarding BP med, needs refill.  Initial call taken by: Charlsie Quest, [place],  April 10, 2010 11:31 AM  Follow-up for Phone Call        Pt informed, also advised he is due for f/u w/Plot, transferred to schedule office visit  Follow-up by: Charlsie Quest, CMA,  April 10, 2010 3:38 PM    Prescriptions: COZAAR 100 MG TABS (LOSARTAN POTASSIUM) 1 by mouth qd  #90 x 0   Entered by:   Charlsie Quest, CMA   Authorized by:   Cassandria Anger MD   Signed by:   Charlsie Quest, CMA on 04/10/2010   Method used:   Electronically to        [facility] (retail)       [place], [place]  [id number]       Ph: [phone]       Fax: [phone]   RxID:   [id number]

## 2010-05-31 NOTE — Letter (Signed)
Summary: [facility]  referral  [facility]  referral   Imported By: Tobin Chad 04/16/2010 11:11:28  _____________________________________________________________________  External Attachment:    Type:   Image     Comment:   External Document

## 2010-06-01 ENCOUNTER — Telehealth: Payer: Self-pay | Admitting: Internal Medicine

## 2010-06-03 ENCOUNTER — Emergency Department (HOSPITAL_COMMUNITY)
Admission: EM | Admit: 2010-06-03 | Discharge: 2010-06-03 | Disposition: A | Discharge status: Home / Self Care | Payer: 59 | Attending: Emergency Medicine | Admitting: Emergency Medicine

## 2010-06-03 DIAGNOSIS — H9209 Otalgia, unspecified ear: Secondary | ICD-10-CM | POA: Insufficient documentation

## 2010-06-03 DIAGNOSIS — I10 Essential (primary) hypertension: Secondary | ICD-10-CM | POA: Insufficient documentation

## 2010-06-03 DIAGNOSIS — H60399 Other infective otitis externa, unspecified ear: Secondary | ICD-10-CM | POA: Insufficient documentation

## 2010-06-06 NOTE — Progress Notes (Signed)
Summary: REQ FOR RX   Phone Note Call from Patient   Summary of Call: Patient is requesting ammox rx for ear infection - c/o ear pain and pressure.  Initial call taken by: Charlsie Quest, [place],  June 01, 2010 9:35 AM  Follow-up for Phone Call        needs ov - it could be something else Follow-up by: Cassandria Anger MD,  June 01, 2010 9:50 AM  Additional Follow-up for Phone Call Additional follow up Details #1::        Spoke w/pt - he is unable to come in today due to wk . Call was cut off - returned call to pt and left vm to call back and schedule w/Sat clinic. Additional Follow-up by: Charlsie Quest, [place],  June 01, 2010 11:24 AM

## 2010-07-09 ENCOUNTER — Encounter: Payer: Self-pay | Admitting: Internal Medicine

## 2010-07-09 ENCOUNTER — Ambulatory Visit (INDEPENDENT_AMBULATORY_CARE_PROVIDER_SITE_OTHER): Payer: 59 | Admitting: Internal Medicine

## 2010-07-09 DIAGNOSIS — N529 Male erectile dysfunction, unspecified: Secondary | ICD-10-CM

## 2010-07-09 DIAGNOSIS — B079 Viral wart, unspecified: Secondary | ICD-10-CM

## 2010-07-09 DIAGNOSIS — I10 Essential (primary) hypertension: Secondary | ICD-10-CM

## 2010-07-17 NOTE — Assessment & Plan Note (Signed)
Summary: 3 MO FU  /CD  / NWS #   Vital Signs:  Patient profile:   35 year old male Height:      73 inches (185.42 cm) Weight:      280.25 pounds (127.39 kg) BMI:     37.11 O2 Sat:      96 % on Room air Temp:     98.3 degrees F (36.83 degrees C) oral Pulse rate:   87 / minute Resp:     16 per minute BP sitting:   138 / 82  (left arm) Cuff size:   large  Vitals Entered By: Mackey Birchwood CMA(AAMA) (July 09, 2010 2:50 PM)  O2 Flow:  Room air CC: 20-monthF/U.Pt wants to discuss alternative BP medicines/sls,cma Is Patient Diabetic? No Comments Pt states he never started using Phentermine.   Primary Care Provider:  ACassandria AngerMD  CC:  478-month/U.Pt wants to discuss alternative BP medicines/sls and cma.  History of Present Illness: F/u HTN C/o ED C/o warts  Current Medications (verified): 1)  Cozaar 100 Mg Tabs (Losartan Potassium) ...[name] 1 By Mouth Qd 2)  Vitamin D (Ergocalciferol) 50000 Unit Caps (Ergocalciferol) ...[name] 1 By Mouth Q 1 Week X 6 Weeks Then Start Vit D 1000 International Units Qd 3)  Vitamin D 1000 Unit Tabs (Cholecalciferol) ...[name] 1 By Mouth Qd 4)  Cialis 20 Mg Tabs (Tadalafil) .... 1/2 or 1 By Mouth Q 1-3 D Prn 5)  Amlodipine Besylate 5 Mg Tabs (Amlodipine Besylate) ...[name] 1 By Mouth Qd 6)  Ibuprofen 600 Mg Tabs (Ibuprofen) ...[name] 1 By Mouth Bid  Pc X 1 Wk Then As Needed For  Pain 7)  Tramadol Hcl 50 Mg Tabs (Tramadol Hcl) ...[name] 1-2 Tabs By Mouth Two Times A Day As Needed Pain 8)  Phentermine Hcl 37.5 Mg Tabs (Phentermine Hcl) ...[name] 1 By Mouth Qam  Allergies (verified): 1)  Hydrochlorothiazide  Past History:  Past Medical History: Allergic rhinitis Hypertension Mild OSA      - RDI 6 from [facility] 10/04/09 Bruxism Plantar fasciitis 2010Gen warts  Past Surgical History: Reviewed history from 09/06/2009 and no changes required. Bilateral ethmoidectomy, Rt nasal polypectomy, Lt maxillary antrostomy March 2011, Dr. 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         ED stressed out  Physical Exam  General:  alert, well-developed, well-nourished, well-hydrated, and good hygiene.   Nose:  nasal dischargemucosal pallor.   Mouth:  Oral mucosa and oropharynx without lesions or exudates.  Teeth in good repair. Neck:  No deformities, masses, or tenderness noted. Lungs:  Normal respiratory effort, chest expands symmetrically. Lungs are clear to auscultation, no crackles or wheezes. Heart:  Normal rate and regular rhythm. S1 and S2 normal without gallop, murmur, click, rub or other extra sounds. Abdomen:  Bowel sounds positive,abdomen soft and non-tender without masses, organomegaly or hernias noted. Genitalia:  A cluster of 2 penile warts on R shaft - smaller Msk:  No deformity or scoliosis noted of thoracic or lumbar spine.  B heels are tender Skin:  he has mild patches of sclaing and hypopigmentation on his face - better.   Psych:  Cognition and judgment appear intact. Alert and cooperative with normal attention span and concentration. No apparent  delusions, illusions, hallucinations   Impression & Recommendations:  Problem # 1:  HYPERTENSION (ICD-401.9) Assessment Unchanged  His updated medication list for this problem includes:    Cozaar 100 Mg Tabs (Losartan potassium) .[name]... 1 by mouth qd    Amlodipine Besylate 5 Mg Tabs (Amlodipine besylate) .[name]... 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 The following medications were removed from the medication list:    Cialis 20 Mg Tabs (Tadalafil) .[name]... 1/2 or 1 by mouth q 1-3 d prn His updated medication list for this problem includes:    Staxyn 10 Mg  Tbdp (Vardenafil hcl) .[name]... 1 by mouth once daily prn Testost is ordered  Problem # 3:  SEBORRHEIC DERMATITIS (ICD-690.10) Assessment: Improved  Problem # 4:  STRESS DISORDER (ICD-V62.89) Assessment: Unchanged reduce your hrs!  Problem # 5:  WART, VIRAL (ICD-078.10) Assessment: Unchanged  Orders: Wart Destruct <14 (71696) Procedure: cryo Indication: warts(s) R penile shaft skin Risks incl. scar(s), incomplete removal, ect.  and benefits discussed     2  lesion(s) on his  penis was/were treated with liqid N2 in usual fasion.  Tolerated well. Compl. none. Wound care instructions given.  Complete Medication List: 1)  Cozaar 100 Mg Tabs (Losartan potassium) .[name].. 1 by mouth qd 2)  Vitamin D (ergocalciferol) 50000 Unit Caps (Ergocalciferol) .[name].. 1 by mouth q 1 week x 6 weeks then start vit d 1000 international units qd 3)  Vitamin D 1000 Unit Tabs (Cholecalciferol) .[name].. 1 by mouth qd 4)  Amlodipine Besylate 5 Mg Tabs (Amlodipine besylate) .[name].. 1 by mouth qd 5)  Ibuprofen 600 Mg Tabs (Ibuprofen) .[name].. 1 by mouth bid  pc x 1 wk then as needed for  pain 6)  Tramadol Hcl 50 Mg Tabs (Tramadol hcl) .[name].. 1-2 tabs by mouth two times a day as needed pain 7)  Phentermine Hcl 37.5 Mg Tabs (Phentermine hcl) .[name].. 1 by mouth qam 8)  Staxyn 10 Mg Tbdp (Vardenafil hcl) .[name].. 1 by mouth once daily prn  Patient Instructions: 1)  Hold Amlodipine x 1 or 2 wks to see if better 2)  Please schedule a follow-up appointment in 3 months. 3)  BMP prior to visit, ICD-9: 4)  TSH prior to visit, ICD-9: 5)  Testosterone 401.1  995.20 Prescriptions: STAXYN 10 MG TBDP (VARDENAFIL HCL) 1 by mouth once daily prn  #12 x 12   Entered and Authorized by:   Cassandria Anger MD   Signed by:   Cassandria Anger MD on 07/09/2010   Method used:   Print then Give to Patient   RxID:   [id number] AMLODIPINE BESYLATE 5 MG TABS (AMLODIPINE BESYLATE) 1 by mouth qd  #90 x 3   Entered and Authorized by:   Cassandria Anger MD   Signed by:   Cassandria Anger MD on 07/09/2010   Method used:   Electronically to        [facility] (retail)       [place], [place]  [id number]       Ph: [phone]       Fax: [phone]   RxID:   [id number] COZAAR 100 MG TABS (LOSARTAN POTASSIUM) 1 by mouth qd  #90 x 0   Entered and Authorized by:   Cassandria Anger MD   Signed by:   Cassandria Anger MD on 07/09/2010   Method  used:   Electronically to        [facility] (retail)       [place], [place]  [id number]       Ph: [phone]       Fax: [phone]   RxID:   [id number]    Orders Added: 1)  Est. Patient Level IV [49494] 2)  Wart Destruct <14 [47395]

## 2010-09-14 NOTE — Assessment & Plan Note (Signed)
[facility]                           PRIMARY CARE OFFICE NOTE   CARLYLE, MCELRATH                       MRN:          [id number]  DATE:06/04/2006                            DOB:          [birth year]    The patient is a 35 year old male, new patient, who presents to  establish primary with me and to have a wellness exam.  He also has a  number of concerns including loud snoring, elevated blood pressure,  previously he was noted to have systolic in the range of 810 to 175 and  diastolic in the 10C.  He also has a problem with dry skin, frequent  bowel movements, a lot of gas, and an occasional rash.   PAST MEDICAL HISTORY:  Unremarkable.  Injury to the right hand.   ALLERGIES:  None.   MEDICATIONS:  None.   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  He is married  with 2 kids.  Does smoke with drink.  Tries to exercise.   FAMILY HISTORY:  Both parents have hypertension.  No diabetes.  Family  history was also positive for breast cancer.  Grandfather with colon  cancer.   REVIEW OF SYSTEMS:  No chest pain.  No shortness of breath.  No syncope.  No neurologic complaints.  No blood in the stool.  The rest is negative.   PHYSICAL:  Looks well.  Blood pressure 163/90.  Pulse 77.  Temperature  98.4.  Weight 265 pounds.  HEENT:  Moist mucosa.  Swollen nasal mucosa.  No enlarged tonsils or  adenoids noted.  NECK:  Supple.  No thyromegaly or bruits.  LUNGS:  Clear to auscultation and percussion.  No wheezes or rales.  HEART:  S1, S2.  No murmur.  No gallop.  ABDOMEN:  Soft, nontender.  No organomegaly or mass felt.  Lower extremities without edema.  He is alert and cooperative. Denies being depressed.  Genital self-exam normal.  SKIN:  Dry with slight patches of erythema.   ASSESSMENT AND PLAN:  1. Normal wellness examination.  Age/health-related issues discussed.      Healthy lifestyle discussed.  Obtain laboratory appropriate for     age.  [place] diet.  Exercise, weight loss.  2. Elevated blood pressure, likely hypertension, start giving advice      on diet and exercise if continues to have elevated blood pressure.      Given prescription for Norvasc 5 mg to start with 1/2 daily for a      week and then 1 daily.  I will see him back in 3 months.  3. Snoring.  Likely aggravated by allergic rhinitis.  Given Flonase.      Two sprays each nostril 1 daily.  4. Allegra 180 mg daily.  Can take a decongestant p.r.n. if needed.  5. Frequent bowel movements with flatulence.  Given Flagyl 500 p.o.      t.i.d. for 10 days, pro-biotics for 1 month.  No alcohol while on      Flagyl.  6. Rash/dry skin.  Given triamcinolone 0.5% cream to use  b.i.d. p.r.n.      He will use lotion prior to shower.     Evie Lacks. Plotnikov, MD  Electronically Signed    AVP/MedQ  DD: 06/05/2006  DT: 06/05/2006  Job #: [id number]

## 2010-10-03 ENCOUNTER — Other Ambulatory Visit: Payer: Self-pay | Admitting: Internal Medicine

## 2010-10-03 DIAGNOSIS — T887XXA Unspecified adverse effect of drug or medicament, initial encounter: Secondary | ICD-10-CM

## 2010-10-03 DIAGNOSIS — I10 Essential (primary) hypertension: Secondary | ICD-10-CM

## 2010-10-10 ENCOUNTER — Encounter: Payer: Self-pay | Admitting: Internal Medicine

## 2010-10-10 ENCOUNTER — Other Ambulatory Visit (INDEPENDENT_AMBULATORY_CARE_PROVIDER_SITE_OTHER): Payer: 59

## 2010-10-10 ENCOUNTER — Other Ambulatory Visit: Payer: 59

## 2010-10-10 ENCOUNTER — Ambulatory Visit (INDEPENDENT_AMBULATORY_CARE_PROVIDER_SITE_OTHER): Payer: 59 | Admitting: Internal Medicine

## 2010-10-10 ENCOUNTER — Encounter: Payer: Self-pay | Admitting: Gastroenterology

## 2010-10-10 DIAGNOSIS — K921 Melena: Secondary | ICD-10-CM

## 2010-10-10 DIAGNOSIS — T887XXA Unspecified adverse effect of drug or medicament, initial encounter: Secondary | ICD-10-CM

## 2010-10-10 DIAGNOSIS — I10 Essential (primary) hypertension: Secondary | ICD-10-CM

## 2010-10-10 DIAGNOSIS — R109 Unspecified abdominal pain: Secondary | ICD-10-CM

## 2010-10-10 LAB — CBC WITH DIFFERENTIAL/PLATELET
Basophils Absolute: 0.1 10*3/uL (ref 0.0–0.1)
HCT: 43.5 % (ref 39.0–52.0)
Hemoglobin: 14.8 g/dL (ref 13.0–17.0)
Lymphs Abs: 2.3 10*3/uL (ref 0.7–4.0)
MCHC: 34.1 g/dL (ref 30.0–36.0)
MCV: 83.5 fl (ref 78.0–100.0)
Monocytes Absolute: 0.5 10*3/uL (ref 0.1–1.0)
Neutro Abs: 3.5 10*3/uL (ref 1.4–7.7)
Platelets: 362 10*3/uL (ref 150.0–400.0)
RDW: 13.1 % (ref 11.5–14.6)

## 2010-10-10 LAB — BASIC METABOLIC PANEL
BUN: 11 mg/dL (ref 6–23)
Creatinine, Ser: 0.9 mg/dL (ref 0.4–1.5)
GFR: 115.9 mL/min (ref >60.00)
Potassium: 4 mEq/L (ref 3.5–5.1)

## 2010-10-10 LAB — TSH: TSH: 1.59 u[IU]/mL (ref 0.35–5.50)

## 2010-10-10 LAB — TESTOSTERONE: Testosterone: 321.83 ng/dL — ABNORMAL LOW (ref 350.00–890.00)

## 2010-10-10 LAB — PROTIME-INR: Prothrombin Time: 10.5 s (ref 10.2–12.4)

## 2010-10-10 MED ORDER — HYDROCORTISONE ACETATE 25 MG RE SUPP: 25.0000 mg | Freq: Two times a day (BID) | RECTAL | Status: DC

## 2010-10-10 MED ORDER — AMLODIPINE BESYLATE 5 MG PO TABS: 5.0000 mg | ORAL_TABLET | Freq: Every day | ORAL | Status: DC

## 2010-10-10 MED ORDER — CIPROFLOXACIN HCL 500 MG PO TABS: 500.0000 mg | ORAL_TABLET | Freq: Two times a day (BID) | ORAL | Status: DC

## 2010-10-10 MED ORDER — LOSARTAN POTASSIUM 100 MG PO TABS: 100.0000 mg | ORAL_TABLET | Freq: Every day | ORAL | Status: DC

## 2010-10-10 NOTE — Assessment & Plan Note (Signed)
Cont Rx

## 2010-10-10 NOTE — Patient Instructions (Signed)
[email or web address]

## 2010-10-10 NOTE — Assessment & Plan Note (Signed)
Poss colitis Cipro

## 2010-10-10 NOTE — Assessment & Plan Note (Signed)
Colitis vs other Cipro Anusol hc supp GI cons Dr Ardis Hughs Pt refused anoscopy

## 2010-10-10 NOTE — Progress Notes (Signed)
  Subjective:    Patient ID: Casey Kelly, male    DOB: 1975-08-10, 35 y.o.   MRN: [id number]  HPI The patient presents for a follow-up of  chronic hypertension, ED controlled with medicines C/o blood in stool x 4-5 d on top of BM. No rectal pain. C/o lower abd pain    Review of Systems  Constitutional: Negative for appetite change, fatigue and unexpected weight change.  HENT: Negative for nosebleeds, congestion, sore throat, sneezing, trouble swallowing and neck pain.   Eyes: Negative for itching and visual disturbance.  Respiratory: Negative for cough.   Cardiovascular: Negative for chest pain, palpitations and leg swelling.  Gastrointestinal: Negative for nausea, diarrhea, blood in stool and abdominal distention.  Genitourinary: Negative for frequency and hematuria.  Musculoskeletal: Negative for back pain, joint swelling and gait problem.  Skin: Negative for rash.  Neurological: Negative for dizziness, tremors, speech difficulty and weakness.  Psychiatric/Behavioral: Negative for sleep disturbance, dysphoric mood and agitation. The patient is not nervous/anxious.        Objective:   Physical Exam  Constitutional: He is oriented to person, place, and time. He appears well-developed.  HENT:  Mouth/Throat: Oropharynx is clear and moist.  Eyes: Conjunctivae are normal. Pupils are equal, round, and reactive to light.  Neck: Normal range of motion. No JVD present. No thyromegaly present.  Cardiovascular: Normal rate, regular rhythm, normal heart sounds and intact distal pulses.  Exam reveals no gallop and no friction rub.   No murmur heard. Pulmonary/Chest: Effort normal and breath sounds normal. No respiratory distress. He has no wheezes. He has no rales. He exhibits no tenderness.  Abdominal: Soft. Bowel sounds are normal. He exhibits no distension and no mass. There is no tenderness. There is no rebound and no guarding.  Genitourinary: Rectum normal and prostate normal. Guaiac  negative stool.  Musculoskeletal: Normal range of motion. He exhibits no edema and no tenderness.  Lymphadenopathy:    He has no cervical adenopathy.  Neurological: He is alert and oriented to person, place, and time. He has normal reflexes. No cranial nerve deficit. He exhibits normal muscle tone. Coordination normal.  Skin: Skin is warm and dry. No rash noted.  Psychiatric: He has a normal mood and affect. His behavior is normal. Judgment and thought content normal.          Assessment & Plan:

## 2010-10-11 ENCOUNTER — Telehealth: Payer: Self-pay | Admitting: *Deleted

## 2010-10-11 NOTE — Telephone Encounter (Signed)
Patient requesting results of labs.

## 2010-10-12 NOTE — Telephone Encounter (Signed)
Decreased Testosterone. Loose 10 lbs. RTC 6 wks with testost, LH, FSH prior Thx

## 2010-10-12 NOTE — Telephone Encounter (Signed)
Pt informed. He will call back Monday to sched 6 wk appt.

## 2010-11-21 ENCOUNTER — Ambulatory Visit: Payer: 59 | Admitting: Gastroenterology

## 2010-12-05 ENCOUNTER — Encounter: Payer: Self-pay | Admitting: Internal Medicine

## 2010-12-05 ENCOUNTER — Ambulatory Visit (INDEPENDENT_AMBULATORY_CARE_PROVIDER_SITE_OTHER): Payer: 59 | Admitting: Internal Medicine

## 2010-12-05 DIAGNOSIS — R739 Hyperglycemia, unspecified: Secondary | ICD-10-CM | POA: Insufficient documentation

## 2010-12-05 DIAGNOSIS — E291 Testicular hypofunction: Secondary | ICD-10-CM | POA: Insufficient documentation

## 2010-12-05 DIAGNOSIS — R635 Abnormal weight gain: Secondary | ICD-10-CM

## 2010-12-05 DIAGNOSIS — I10 Essential (primary) hypertension: Secondary | ICD-10-CM

## 2010-12-05 DIAGNOSIS — K921 Melena: Secondary | ICD-10-CM

## 2010-12-05 DIAGNOSIS — R7309 Other abnormal glucose: Secondary | ICD-10-CM

## 2010-12-05 MED ORDER — LOSARTAN POTASSIUM 100 MG PO TABS: 100.0000 mg | ORAL_TABLET | Freq: Every day | ORAL | Status: DC

## 2010-12-05 MED ORDER — AMLODIPINE BESYLATE 5 MG PO TABS: 5.0000 mg | ORAL_TABLET | Freq: Every day | ORAL | Status: DC

## 2010-12-05 NOTE — Assessment & Plan Note (Signed)
Better -- GI appt is pending

## 2010-12-05 NOTE — Progress Notes (Signed)
  Subjective:    Patient ID: Casey Kelly, male    DOB: 04/10/1976, 35 y.o.   MRN: [id number]  HPI  F/u colitis and HTN. Better 70 %. F/u labs  Review of Systems  Gastrointestinal: Positive for blood in stool. Negative for abdominal pain and diarrhea.   BP Readings from Last 3 Encounters:  12/05/10 130/80  10/10/10 150/92  07/09/10 138/82   Wt Readings from Last 3 Encounters:  12/05/10 268 lb (121.564 kg)  10/10/10 276 lb (125.193 kg)  07/09/10 280 lb 4 oz (127.121 kg)        Objective:   Physical Exam  Constitutional: He is oriented to person, place, and time. He appears well-developed.  HENT:  Mouth/Throat: Oropharynx is clear and moist.  Eyes: Conjunctivae are normal. Pupils are equal, round, and reactive to light.  Neck: Normal range of motion. No JVD present. No thyromegaly present.  Cardiovascular: Normal rate, regular rhythm, normal heart sounds and intact distal pulses.  Exam reveals no gallop and no friction rub.   No murmur heard. Pulmonary/Chest: Effort normal and breath sounds normal. No respiratory distress. He has no wheezes. He has no rales. He exhibits no tenderness.  Abdominal: Soft. Bowel sounds are normal. He exhibits no distension and no mass. There is no tenderness. There is no rebound and no guarding.  Musculoskeletal: Normal range of motion. He exhibits no edema and no tenderness.  Lymphadenopathy:    He has no cervical adenopathy.  Neurological: He is alert and oriented to person, place, and time. He has normal reflexes. No cranial nerve deficit. He exhibits normal muscle tone. Coordination normal.  Skin: Skin is warm and dry. No rash noted.  Psychiatric: He has a normal mood and affect. His behavior is normal. Judgment and thought content normal.          Assessment & Plan:

## 2010-12-05 NOTE — Assessment & Plan Note (Signed)
Better. Cont Rx

## 2010-12-05 NOTE — Assessment & Plan Note (Signed)
Loose wt Recheck labs in 3 mo

## 2010-12-05 NOTE — Assessment & Plan Note (Signed)
Loose wt Exercise

## 2010-12-05 NOTE — Assessment & Plan Note (Signed)
Better w/diet change

## 2010-12-05 NOTE — Patient Instructions (Signed)
Wt Readings from Last 3 Encounters:  12/05/10 268 lb (121.564 kg)  10/10/10 276 lb (125.193 kg)  07/09/10 280 lb 4 oz (127.121 kg)

## 2010-12-21 ENCOUNTER — Encounter: Payer: Self-pay | Admitting: Gastroenterology

## 2010-12-21 ENCOUNTER — Ambulatory Visit (INDEPENDENT_AMBULATORY_CARE_PROVIDER_SITE_OTHER): Payer: 59 | Admitting: Gastroenterology

## 2010-12-21 VITALS — BP 132/80 | HR 74 | Ht 75.0 in | Wt 274.0 lb

## 2010-12-21 DIAGNOSIS — R634 Abnormal weight loss: Secondary | ICD-10-CM

## 2010-12-21 DIAGNOSIS — K625 Hemorrhage of anus and rectum: Secondary | ICD-10-CM

## 2010-12-21 MED ORDER — PEG-KCL-NACL-NASULF-NA ASC-C 100 G PO SOLR: 1.0000 | ORAL | Status: DC

## 2010-12-21 NOTE — Patient Instructions (Signed)
You will be set up for a colonoscopy at [facility] for minor rectal bleeding. A copy of this information will be made available to Dr. Alain Marion.

## 2010-12-21 NOTE — Progress Notes (Signed)
HPI: This is a  very pleasant 35 year old man  Noticed B RB per rectum, mucousy.  EVery time with BM.  Can be small amount on tissue paper.  No anal pains.  He used to having anal itching.   Very rarely constipation. Has 4-5 bms a day, soft, not watery.  Over has lost 10-15 pounds lately.  No colon cancer in family.  Not anemic on recent CBC.    Also interittent right flank pain.  Not a big bother to him.  Reminds him of previous kidney stone.  Never had colonoscopy.  Clinical lactose intolerance.    Review of systems: Pertinent positive and negative review of systems were noted in the above HPI section.  All other review of systems was otherwise negative.   Past Medical History  Diagnosis Date  . Allergic rhinitis   . Hypertension     mild  . OSA (obstructive sleep apnea)   . Bruxism   . Plantar fasciitis 2010  . History of kidney stones     Past Surgical History  Procedure Date  . Ethmoidectomy     bilateral  . Nasal polyp surgery     Right  . Maxillary antrostomy 06/2009    Left  . Adenoidectomy      reports that he has never smoked. He has never used smokeless tobacco. He reports that he does not drink alcohol or use illicit drugs.  family history includes Allergic rhinitis in his daughter, mother, and son; Asthma in his son; Breast cancer in his mother; Colon polyps in his father; Lung cancer in his father; and Prostate cancer in his father.  There is no history of Colon cancer.    Current Medications, Allergies were all reviewed with the patient via [facility] record system.    Physical Exam: BP 132/80  Pulse 74  Ht 6' 3"  (1.905 m)  Wt 274 lb (124.286 kg)  BMI 34.25 kg/m2 Constitutional: generally well-appearing Psychiatric: alert and oriented x3 Eyes: extraocular movements intact Mouth: oral pharynx moist, no lesions Neck: supple no lymphadenopathy Cardiovascular: heart regular rate and rhythm Lungs: clear to auscultation  bilaterally Abdomen: soft, nontender, nondistended, no obvious ascites, no peritoneal signs, normal bowel sounds Extremities: no lower extremity edema bilaterally Skin: no lesions on visible extremities    Assessment and plan: 35 y.o. male with intermittent, mild rectal bleeding, recent weight loss intentionally  He is not anemic or microcytic. He has lost some weight and has seen some minor rectal bleeding. This sounds anorectal in origin, likely hemorrhoid or small anal fissure. A rectal exam was deferred for upcoming colonoscopy. We will schedule that at his soonest convenience.

## 2011-01-07 ENCOUNTER — Ambulatory Visit: Payer: 59 | Admitting: Gastroenterology

## 2011-01-09 ENCOUNTER — Ambulatory Visit (AMBULATORY_SURGERY_CENTER): Payer: 59 | Admitting: Gastroenterology

## 2011-01-09 ENCOUNTER — Encounter: Payer: Self-pay | Admitting: Gastroenterology

## 2011-01-09 DIAGNOSIS — K625 Hemorrhage of anus and rectum: Secondary | ICD-10-CM

## 2011-01-09 DIAGNOSIS — R634 Abnormal weight loss: Secondary | ICD-10-CM

## 2011-01-09 DIAGNOSIS — R933 Abnormal findings on diagnostic imaging of other parts of digestive tract: Secondary | ICD-10-CM

## 2011-01-09 DIAGNOSIS — K5289 Other specified noninfective gastroenteritis and colitis: Secondary | ICD-10-CM

## 2011-01-09 MED ORDER — SODIUM CHLORIDE 0.9 % IV SOLN: 500.0000 mL | INTRAVENOUS | Status: DC

## 2011-01-10 ENCOUNTER — Telehealth: Payer: Self-pay | Admitting: Gastroenterology

## 2011-01-10 ENCOUNTER — Telehealth: Payer: Self-pay | Admitting: *Deleted

## 2011-01-10 NOTE — Telephone Encounter (Signed)
Pt asking what was found if anything yest with his colonoscopy. I asked pt did he have his discharge papers and report from yesterday, he said they were in his car and he hasnt looked at them and his wife couldn't remember what was told to her. Told pt that he had an area of abnormal mucosa that was biopsied and no polyps or cancers seen. Encouraged pt to get his report and review the pictures and instructed him that Dr Ardis Hughs findings are listed at the end of this report as well. Told he should also have discharge papers from recovery yest with the picture report.  Pt states that he feels better and will get all of the papers and review them. Encouraged pt to call with further questions. Pt verbalized understanding of all instructions given. EWM

## 2011-01-10 NOTE — Telephone Encounter (Signed)
Called pt back to answer questions he has and there was no answer.

## 2011-01-10 NOTE — Telephone Encounter (Signed)
Tried to call pt again and still no answer.

## 2011-01-10 NOTE — Telephone Encounter (Signed)
Cell and home number the same and no message, just automated message. No message left due to no ID. [email or web address]

## 2011-01-17 ENCOUNTER — Telehealth: Payer: Self-pay | Admitting: Gastroenterology

## 2011-01-17 DIAGNOSIS — K5090 Crohn's disease, unspecified, without complications: Secondary | ICD-10-CM

## 2011-01-17 MED ORDER — PREDNISONE 10 MG PO TABS: 10.0000 mg | ORAL_TABLET | Freq: Two times a day (BID) | ORAL | Status: AC

## 2011-01-17 NOTE — Telephone Encounter (Signed)
SBFT 01/21/11 9 am 845 arrival [facility] radiology nothing to eat or drink after midnight Prometheus test in [facility]   Pt needs to sign waiver pt aware and will sign waiver before the test. ROV scheduled 02/06/11 1015 am  Prednisone sent to the pharmacy   Pt aware of all instructions and will call with any questions

## 2011-01-17 NOTE — Telephone Encounter (Signed)
Patty, Please call him. The biopsies suggest he has chronic inflammation in colon and small bowel (Crohn's disease).  I would like to get him started on prednisone 49m pills, take 2 pills, twice daily.  Disp 120 with 2 refills.  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  Mesalamine is another option but it would have no effect for small bowel inflammation.  HE also needs a SBFT test done, to check for further small bowel disease.  Also ROV with me in 3-4 weeks, double book if needed.  Natalie, no result note is needed.

## 2011-01-21 ENCOUNTER — Ambulatory Visit (HOSPITAL_COMMUNITY)
Admission: RE | Admit: 2011-01-21 | Discharge: 2011-01-21 | Disposition: A | Discharge status: Home / Self Care | Payer: 59 | Source: Ambulatory Visit | Attending: Gastroenterology | Admitting: Gastroenterology

## 2011-01-21 ENCOUNTER — Other Ambulatory Visit: Payer: 59

## 2011-01-21 DIAGNOSIS — K5090 Crohn's disease, unspecified, without complications: Secondary | ICD-10-CM | POA: Insufficient documentation

## 2011-01-21 DIAGNOSIS — R109 Unspecified abdominal pain: Secondary | ICD-10-CM | POA: Insufficient documentation

## 2011-02-03 ENCOUNTER — Telehealth: Payer: Self-pay | Admitting: Gastroenterology

## 2011-02-03 NOTE — Telephone Encounter (Signed)
Prometheus testing scanned in.  Normal tpmt activity.  Will discuss at rov this week

## 2011-02-05 ENCOUNTER — Encounter: Payer: Self-pay | Admitting: Gastroenterology

## 2011-02-06 ENCOUNTER — Telehealth: Payer: Self-pay | Admitting: *Deleted

## 2011-02-06 ENCOUNTER — Ambulatory Visit: Payer: 59 | Admitting: Gastroenterology

## 2011-02-06 NOTE — Telephone Encounter (Signed)
I do not have any recent tests Thx

## 2011-02-06 NOTE — Telephone Encounter (Signed)
Pt left vm requesting recent lab results. Please advise

## 2011-02-07 NOTE — Telephone Encounter (Signed)
Pt informed

## 2011-02-08 ENCOUNTER — Telehealth: Payer: Self-pay | Admitting: Gastroenterology

## 2011-02-08 NOTE — Telephone Encounter (Signed)
Message copied by Oliva Bustard on Fri Feb 08, 2011  3:49 PM ------      Message from: Barron Alvine      Created: Wed Feb 06, 2011 10:40 AM       Do not bill

## 2011-02-11 ENCOUNTER — Encounter: Payer: Self-pay | Admitting: Gastroenterology

## 2011-02-13 ENCOUNTER — Encounter: Payer: Self-pay | Admitting: Gastroenterology

## 2011-02-13 ENCOUNTER — Ambulatory Visit (INDEPENDENT_AMBULATORY_CARE_PROVIDER_SITE_OTHER): Payer: 59 | Admitting: Gastroenterology

## 2011-02-13 VITALS — BP 128/90 | HR 68 | Ht 75.5 in | Wt 274.0 lb

## 2011-02-13 DIAGNOSIS — K5090 Crohn's disease, unspecified, without complications: Secondary | ICD-10-CM

## 2011-02-13 MED ORDER — PREDNISONE 10 MG PO TABS: 10.0000 mg | ORAL_TABLET | Freq: Two times a day (BID) | ORAL | Status: AC

## 2011-02-13 MED ORDER — AZATHIOPRINE 100 MG PO TABS
200.0000 mg | ORAL_TABLET | Freq: Every day | ORAL | Status: AC
Start: 2011-02-13 — End: 2012-02-13

## 2011-02-13 NOTE — Patient Instructions (Addendum)
Get back on prednisone 52m twice daily, do not change dose. Start azathriaprine 2029monce daily. CBC, cmet in 2 weeks.  On or about 02/27/11 [place] basement lab 730 am -5 pm Return to see Dr. JaArdis Hughsn 6-8 weeks.

## 2011-02-13 NOTE — Progress Notes (Signed)
Review of pertinent gastrointestinal problems: 1. Crohn's disease, diagnosed September 2012 when he presented with intermittent rectal bleeding, loose stools. Colonoscopy September 2012 showed 20 cm segment in his left colon with chronic, also active colitis on biopsy.  Terminal ileum found active ileitis as well on biopsy. TPMT enzyme level was normal. Small bowel follow-through was also normal.  HPI: This is a  very pleasant 35 year old man whom I saw last at his colonoscopy. See those results summarized above.  He took prednisone 53m bid, for about 4 weeks.  He noticed that the bleeding completely stopped, within 1-2 days of starting the steroids.  Was having intermittent loose stools (felt it was diet related usually). 4 days out of the week he will have urgency.  The urgency improved on steroids.      Past Medical History  Diagnosis Date  . Allergic rhinitis   . Hypertension     mild  . OSA (obstructive sleep apnea)   . Bruxism   . Plantar fasciitis 2010  . History of kidney stones     Past Surgical History  Procedure Date  . Ethmoidectomy     bilateral  . Nasal polyp surgery     Right  . Maxillary antrostomy 06/2009    Left  . Adenoidectomy   . Hand surgery     Current Outpatient Prescriptions  Medication Sig Dispense Refill  . amLODipine (NORVASC) 5 MG tablet Take 1 tablet (5 mg total) by mouth daily.  90 tablet  3  . losartan (COZAAR) 100 MG tablet Take 1 tablet (100 mg total) by mouth daily.  90 tablet  3  . ciprofloxacin (CIPRO) 500 MG tablet Take 1 tablet (500 mg total) by mouth 2 (two) times daily.  14 tablet  0  . hydrocortisone (ANUSOL-HC) 25 MG suppository Place 1 suppository (25 mg total) rectally 2 (two) times daily.  20 suppository  1    Allergies as of 02/13/2011 - Review Complete 02/13/2011  Allergen Reaction Noted  . Hydrochlorothiazide  11/13/2009    Family History  Problem Relation Age of Onset  . Allergic rhinitis Mother   . Breast cancer  Mother   . Lung cancer Father   . Allergic rhinitis Daughter   . Allergic rhinitis Son   . Asthma Son   . Colon polyps Father   . Prostate cancer Father   . Colon cancer Neg Hx     History   Social History  . Marital Status: Married    Spouse Name: N/A    Number of Children: 3  . Years of Education: N/A   Occupational History  . BARBER   . [employer] Other   Social History Main Topics  . Smoking status: Never Smoker   . Smokeless tobacco: Never Used  . Alcohol Use: No  . Drug Use: No  . Sexually Active: Not on file   Other Topics Concern  . Not on file   Social History Narrative   Regular exercise- No1 Caffeine drink daily       Physical Exam: BP 128/90  Pulse 68  Ht 6' 3.5" (1.918 m)  Wt 274 lb (124.286 kg)  BMI 33.80 kg/m2 Constitutional: generally well-appearing Psychiatric: alert and oriented x3 Abdomen: soft, nontender, nondistended, no obvious ascites, no peritoneal signs, normal bowel sounds     Assessment and plan: 35y.o. male with Crohn's ileocolitis  His symptoms are not severe (but certainly very bothersome ) however he does have inflammation in his ileum and his  colon. I am going to start him on azathioprine 200 mg once daily. He will restart prednisone 10 mg twice daily. We will check his labs in 2 weeks and he'll return to see me in 6-8 weeks.

## 2011-02-25 ENCOUNTER — Ambulatory Visit: Payer: 59 | Admitting: Internal Medicine

## 2011-02-28 ENCOUNTER — Telehealth: Payer: Self-pay

## 2011-02-28 NOTE — Telephone Encounter (Signed)
Pt called and reminded to have labs

## 2011-02-28 NOTE — Telephone Encounter (Signed)
Message copied by Barron Alvine on Thu Feb 28, 2011  8:56 AM ------      Message from: Barron Alvine      Created: Wed Feb 13, 2011 10:18 AM       Pt to get labs

## 2011-03-12 ENCOUNTER — Telehealth: Payer: Self-pay | Admitting: *Deleted

## 2011-03-12 DIAGNOSIS — E291 Testicular hypofunction: Secondary | ICD-10-CM

## 2011-03-12 MED ORDER — TESTOSTERONE CYPIONATE 200 MG/ML IM SOLN: 400.0000 mg | INTRAMUSCULAR | Status: DC

## 2011-03-12 NOTE — Telephone Encounter (Signed)
Pt called left vm stating he is ready to start testosterone injection. Please advise.

## 2011-03-12 NOTE — Telephone Encounter (Signed)
OK. Repeat testosterone, LH, FSH. Fill Rx for inj testosterone. Come for an office visit to receive his first inj and instructions  Rx printed Thx

## 2011-03-13 ENCOUNTER — Telehealth: Payer: Self-pay | Admitting: *Deleted

## 2011-03-13 MED ORDER — TESTOSTERONE CYPIONATE 200 MG/ML IM SOLN: 400.0000 mg | INTRAMUSCULAR | Status: DC

## 2011-03-13 NOTE — Telephone Encounter (Signed)
Pt informed/ labs ordered. OV scheduled 03-25-11.

## 2011-03-13 NOTE — Telephone Encounter (Signed)
Testosterone Rx faxed to pharmacy.

## 2011-03-25 ENCOUNTER — Encounter: Payer: Self-pay | Admitting: Internal Medicine

## 2011-03-25 ENCOUNTER — Ambulatory Visit (INDEPENDENT_AMBULATORY_CARE_PROVIDER_SITE_OTHER): Payer: 59 | Admitting: Internal Medicine

## 2011-03-25 VITALS — BP 128/84 | HR 80 | Temp 98.1°F | Resp 16 | Wt 280.0 lb

## 2011-03-25 DIAGNOSIS — E291 Testicular hypofunction: Secondary | ICD-10-CM

## 2011-03-25 DIAGNOSIS — N529 Male erectile dysfunction, unspecified: Secondary | ICD-10-CM

## 2011-03-25 MED ORDER — "SYRINGE/NEEDLE (DISP) 23G X 1-1/2"" 3 ML MISC": 1.0000 [IU] | Status: DC

## 2011-03-25 MED ORDER — "NEEDLE (DISP) 18G X 1"" MISC": 1.0000 [IU] | Status: DC

## 2011-03-25 NOTE — Assessment & Plan Note (Signed)
Continue with current prescription therapy as reflected on the Med list.  

## 2011-03-25 NOTE — Assessment & Plan Note (Signed)
We will start shots - Demo (he hasn't started shots yet) RTC 3 mo

## 2011-03-25 NOTE — Progress Notes (Signed)
  Subjective:    Patient ID: Casey Kelly, male    DOB: 1975/09/09, 35 y.o.   MRN: [id number]  HPI The patient presents for a follow-up of  chronic ED and hypogonadism    Review of Systems  Constitutional: Positive for fatigue. Negative for appetite change and unexpected weight change.  HENT: Negative for nosebleeds, congestion, sore throat, sneezing, trouble swallowing and neck pain.   Eyes: Negative for itching and visual disturbance.  Respiratory: Negative for cough.   Cardiovascular: Negative for chest pain, palpitations and leg swelling.  Gastrointestinal: Negative for nausea, diarrhea, blood in stool and abdominal distention.  Genitourinary: Negative for frequency and hematuria.  Musculoskeletal: Negative for back pain, joint swelling and gait problem.  Skin: Negative for rash.  Neurological: Negative for dizziness, tremors, speech difficulty and weakness.  Psychiatric/Behavioral: Negative for sleep disturbance, dysphoric mood and agitation. The patient is not nervous/anxious.        Objective:   Physical Exam  Constitutional: He is oriented to person, place, and time. He appears well-developed.  HENT:  Mouth/Throat: Oropharynx is clear and moist.  Eyes: Conjunctivae are normal. Pupils are equal, round, and reactive to light.  Neck: Normal range of motion. No JVD present. No thyromegaly present.  Cardiovascular: Normal rate, regular rhythm, normal heart sounds and intact distal pulses.  Exam reveals no gallop and no friction rub.   No murmur heard. Pulmonary/Chest: Effort normal and breath sounds normal. No respiratory distress. He has no wheezes. He has no rales. He exhibits no tenderness.  Abdominal: Soft. Bowel sounds are normal. He exhibits no distension and no mass. There is no tenderness. There is no rebound and no guarding.  Musculoskeletal: Normal range of motion. He exhibits no edema and no tenderness.  Lymphadenopathy:    He has no cervical adenopathy.    Neurological: He is alert and oriented to person, place, and time. He has normal reflexes. No cranial nerve deficit. He exhibits normal muscle tone. Coordination normal.  Skin: Skin is warm and dry. No rash noted.  Psychiatric: He has a normal mood and affect. His behavior is normal. Judgment and thought content normal.     Labs reviewed     Assessment & Plan:

## 2011-03-26 ENCOUNTER — Telehealth: Payer: Self-pay | Admitting: *Deleted

## 2011-03-26 NOTE — Telephone Encounter (Signed)
Pt is req jury duty excuse letter due to his crohn's disease. Please advise.

## 2011-03-26 NOTE — Telephone Encounter (Signed)
Sorry, I can't do it. He can contact GI. Thx

## 2011-03-27 ENCOUNTER — Telehealth: Payer: Self-pay

## 2011-03-27 ENCOUNTER — Telehealth: Payer: Self-pay | Admitting: Gastroenterology

## 2011-03-27 MED ORDER — TESTOSTERONE CYPIONATE 200 MG/ML IM SOLN
400.0000 mg | Freq: Once | INTRAMUSCULAR | Status: AC
  Administered 2011-03-27: 400 mg via INTRAMUSCULAR

## 2011-03-27 NOTE — Progress Notes (Signed)
Addended by: Cresenciano Lick on: 03/27/2011 03:35 PM   Modules accepted: Orders

## 2011-03-27 NOTE — Telephone Encounter (Signed)
Pt will be in today for labs.

## 2011-03-27 NOTE — Telephone Encounter (Signed)
That is ok, but only AFTER he does the labs we've wanted him to do (cbc, cmet).

## 2011-03-27 NOTE — Telephone Encounter (Signed)
Pt would like a letter stating that he unable to be on jury duty because of his crohns is this ok?

## 2011-03-27 NOTE — Telephone Encounter (Signed)
Pt advised to take his documents down to the basement and the form will be filled out

## 2011-03-27 NOTE — Telephone Encounter (Signed)
Left message on machine to call back  

## 2011-03-27 NOTE — Telephone Encounter (Signed)
Pt has contacted GI regarding this request.

## 2011-04-07 ENCOUNTER — Encounter (HOSPITAL_COMMUNITY): Payer: Self-pay | Admitting: *Deleted

## 2011-04-07 ENCOUNTER — Emergency Department (INDEPENDENT_AMBULATORY_CARE_PROVIDER_SITE_OTHER)
Admission: EM | Admit: 2011-04-07 | Discharge: 2011-04-07 | Disposition: A | Discharge status: Home / Self Care | Payer: 59 | Source: Home / Self Care

## 2011-04-07 DIAGNOSIS — J039 Acute tonsillitis, unspecified: Secondary | ICD-10-CM

## 2011-04-07 MED ORDER — PENICILLIN V POTASSIUM 500 MG PO TABS: 500.0000 mg | ORAL_TABLET | Freq: Three times a day (TID) | ORAL | Status: AC

## 2011-04-07 MED ORDER — IBUPROFEN 800 MG PO TABS
800.0000 mg | ORAL_TABLET | Freq: Once | ORAL | Status: AC
  Administered 2011-04-07: 800 mg via ORAL

## 2011-04-07 MED ORDER — IBUPROFEN 800 MG PO TABS
ORAL_TABLET | ORAL | Status: AC
  Filled 2011-04-07: qty 1

## 2011-04-07 NOTE — ED Provider Notes (Signed)
History     CSN: [id number] Arrival date & time: 04/07/2011  7:04 PM   None     Chief Complaint  Patient presents with  . Fever    onset of symptoms thursday night  . Generalized Body Aches  . Chills  . Sore Throat    (Consider location/radiation/quality/duration/timing/severity/associated sxs/prior treatment) Patient is a 35 y.o. male presenting with fever and pharyngitis. The history is provided by the patient.  Fever Primary symptoms of the febrile illness include fever and myalgias. Primary symptoms do not include cough or wheezing. The current episode started 2 days ago. This is a new problem. The problem has been gradually worsening.  The fever began 2 days ago. The fever has been gradually worsening since its onset. The maximum temperature recorded prior to his arrival was unknown.  Myalgias began 2 days ago. The myalgias have been gradually worsening since their onset. The myalgias are generalized. The discomfort from the myalgias is mild.  Sore Throat This is a new problem. The current episode started 2 days ago. The problem occurs constantly. The problem has been gradually worsening. The symptoms are aggravated by swallowing. The symptoms are relieved by nothing. He has tried nothing for the symptoms.    Past Medical History  Diagnosis Date  . Allergic rhinitis   . Hypertension     mild  . OSA (obstructive sleep apnea)   . Bruxism   . Plantar fasciitis 2010  . History of kidney stones   . Crohn's disease     Past Surgical History  Procedure Date  . Ethmoidectomy     bilateral  . Nasal polyp surgery     Right  . Maxillary antrostomy 06/2009    Left  . Adenoidectomy   . Hand surgery     Family History  Problem Relation Age of Onset  . Allergic rhinitis Mother   . Breast cancer Mother   . Lung cancer Father   . Allergic rhinitis Daughter   . Allergic rhinitis Son   . Asthma Son   . Colon polyps Father   . Prostate cancer Father   . Colon cancer Neg  Hx     History  Substance Use Topics  . Smoking status: Never Smoker   . Smokeless tobacco: Never Used  . Alcohol Use: No      Review of Systems  Constitutional: Positive for fever and chills.  HENT: Positive for sore throat. Negative for ear pain, congestion, rhinorrhea, postnasal drip and sinus pressure.   Respiratory: Negative for cough and wheezing.   Musculoskeletal: Positive for myalgias.    Allergies  Hydrochlorothiazide  Home Medications   Current Outpatient Rx  Name Route Sig Dispense Refill  . AMLODIPINE BESYLATE 5 MG PO TABS Oral Take 1 tablet (5 mg total) by mouth daily. 90 tablet 3  . TYLENOL COLD MULTI-SYMPTOM DAY PO Oral Take by mouth.      [name] LOSARTAN POTASSIUM 100 MG PO TABS Oral Take 1 tablet (100 mg total) by mouth daily. 90 tablet 3  . TESTOSTERONE CYPIONATE 200 MG/ML IM OIL Intramuscular Inject 2 mLs (400 mg total) into the muscle every 14 (fourteen) days. 10 mL 5  . AZATHIOPRINE 100 MG PO TABS Oral Take 2 tablets (200 mg total) by mouth daily. 60 tablet 3  . CIPROFLOXACIN HCL 500 MG PO TABS Oral Take 1 tablet (500 mg total) by mouth 2 (two) times daily. 14 tablet 0  . HYDROCORTISONE ACETATE 25 MG RE SUPP Rectal Place 1  suppository (25 mg total) rectally 2 (two) times daily. 20 suppository 1  . NEEDLE (DISP) 18G X 1" MISC Does not apply 1 Units by Does not apply route every 14 (fourteen) days. To draw med out of the vial 25 each 3  . PENICILLIN V POTASSIUM 500 MG PO TABS Oral Take 1 tablet (500 mg total) by mouth 3 (three) times daily. 30 tablet 0  . SYRINGE/NEEDLE (DISP) 23G X 1-1/2" 3 ML MISC Does not apply 1 Units by Does not apply route every 14 (fourteen) days. fot testoterone inj IM q 14 d 100 each 3    BP 150/91  Pulse 112  Temp(Src) 101.6 F (38.7 C) (Oral)  Resp 18  SpO2 96%  Physical Exam  Constitutional: He appears well-developed and well-nourished. He appears ill.  HENT:  Right Ear: Tympanic membrane, external ear and ear canal normal.    Left Ear: Tympanic membrane, external ear and ear canal normal.  Nose: No mucosal edema or rhinorrhea.  Mouth/Throat: Mucous membranes are not dry. Oropharyngeal exudate, posterior oropharyngeal edema and posterior oropharyngeal erythema present.  Cardiovascular: Normal rate and regular rhythm.   Pulmonary/Chest: Effort normal and breath sounds normal.  Lymphadenopathy:       Head (right side): Submandibular adenopathy present.       Head (left side): Submandibular adenopathy present.    He has no cervical adenopathy.    ED Course  Procedures (including critical care time)   Labs Reviewed  POCT RAPID STREP A ([place])   No results found.   1. Tonsillitis       MDM          Carvel Getting, NP 04/07/11 2119

## 2011-04-07 NOTE — ED Notes (Signed)
Pt with c/o fever/bodyaches/chills/sorethroat onset Thursday night

## 2011-04-08 ENCOUNTER — Other Ambulatory Visit (INDEPENDENT_AMBULATORY_CARE_PROVIDER_SITE_OTHER): Payer: 59

## 2011-04-08 ENCOUNTER — Telehealth: Payer: Self-pay | Admitting: Internal Medicine

## 2011-04-08 ENCOUNTER — Ambulatory Visit (INDEPENDENT_AMBULATORY_CARE_PROVIDER_SITE_OTHER): Payer: 59 | Admitting: Internal Medicine

## 2011-04-08 ENCOUNTER — Encounter: Payer: Self-pay | Admitting: Internal Medicine

## 2011-04-08 VITALS — BP 130/88 | HR 84 | Temp 99.7°F | Resp 16 | Wt 274.0 lb

## 2011-04-08 DIAGNOSIS — J339 Nasal polyp, unspecified: Secondary | ICD-10-CM | POA: Insufficient documentation

## 2011-04-08 DIAGNOSIS — J069 Acute upper respiratory infection, unspecified: Secondary | ICD-10-CM

## 2011-04-08 LAB — CBC WITH DIFFERENTIAL/PLATELET
Basophils Absolute: 0 10*3/uL (ref 0.0–0.1)
Lymphocytes Relative: 14.3 % (ref 12.0–46.0)
Monocytes Relative: 16 % — ABNORMAL HIGH (ref 3.0–12.0)
Neutrophils Relative %: 68.3 % (ref 43.0–77.0)
Platelets: 345 10*3/uL (ref 150.0–400.0)
RDW: 13.4 % (ref 11.5–14.6)

## 2011-04-08 LAB — HEPATIC FUNCTION PANEL
AST: 16 U/L (ref 0–37)
Albumin: 3.9 g/dL (ref 3.5–5.2)
Alkaline Phosphatase: 81 U/L (ref 39–117)
Total Protein: 8.3 g/dL (ref 6.0–8.3)

## 2011-04-08 LAB — BASIC METABOLIC PANEL
CO2: 29 mEq/L (ref 19–32)
Calcium: 10 mg/dL (ref 8.4–10.5)
Chloride: 103 mEq/L (ref 96–112)
Sodium: 141 mEq/L (ref 135–145)

## 2011-04-08 MED ORDER — AZITHROMYCIN 250 MG PO TABS: ORAL_TABLET | ORAL | Status: AC

## 2011-04-08 NOTE — ED Provider Notes (Signed)
Medical screening examination/treatment/procedure(s) were performed by non-physician practitioner and as supervising physician I was immediately available for consultation/collaboration.  Burnett Kanaris, MD 04/08/11 1125

## 2011-04-08 NOTE — Progress Notes (Signed)
  Subjective:    Patient ID: Casey Kelly, male    DOB: [birth year], 35 y.o.   MRN: [id number]  HPI   HPI  C/o URI sx's x   days. C/o ST, chills, weakness. Not better with OTC medicines. Actually, the patient is getting worse. The patient did not sleep last night due to cough.  Review of Systems  Constitutional: Positive for fever, chills and fatigue.  HENT: Positive for congestion, rhinorrhea, sneezing and postnasal drip.   Eyes: Positive for photophobia and pain. Negative for discharge and visual disturbance.  Respiratory: Neg for cough and wheezing.  Neg for chest pain.  Gastrointestinal: Negative for vomiting, abdominal pain, diarrhea and abdominal distention.  Genitourinary: Negative for dysuria and difficulty urinating.  Skin: Negative for rash.  Neurological: Positive for dizziness, weakness and light-headedness.      Review of Systems     Objective:   Physical Exam  Constitutional: He is oriented to person, place, and time. He appears well-developed.  HENT:  Mouth/Throat: Oropharyngeal exudate present.       Swollen B tonsils  Eyes: Conjunctivae are normal. Pupils are equal, round, and reactive to light.  Neck: Normal range of motion. No JVD present. No thyromegaly present.  Cardiovascular: Normal rate, regular rhythm, normal heart sounds and intact distal pulses.  Exam reveals no gallop and no friction rub.   No murmur heard. Pulmonary/Chest: Effort normal and breath sounds normal. No respiratory distress. He has no wheezes. He has no rales. He exhibits no tenderness.  Abdominal: Soft. Bowel sounds are normal. He exhibits no distension and no mass. There is no tenderness. There is no rebound and no guarding.  Musculoskeletal: Normal range of motion. He exhibits no edema and no tenderness.  Lymphadenopathy:    He has no cervical adenopathy.  Neurological: He is alert and oriented to person, place, and time. He has normal reflexes. No cranial nerve deficit. He exhibits  normal muscle tone. Coordination normal.  Skin: Skin is warm. No rash noted. He is diaphoretic.  Psychiatric: He has a normal mood and affect. His behavior is normal. Judgment and thought content normal.          Assessment & Plan:

## 2011-04-08 NOTE — Telephone Encounter (Signed)
Casey Kelly, please, inform patient that all labs are ok - mno test was neg Thx

## 2011-04-08 NOTE — Assessment & Plan Note (Signed)
Cont PCN Zpac to add if worse Labs incl mono

## 2011-04-08 NOTE — Patient Instructions (Signed)
Use over-the-counter  "cold" medicines  such as "Tylenol cold" , "Advil cold",  "Mucinex" or" Mucinex D"  for cough and congestion. Avoid decongestants if you have high blood pressure and use "Afrin" nasal spray for nasal congestion as directed instead. Use" Delsym" or" Robitussin" cough syrup varietis for cough.  You can use plain "Tylenol" or "Advi"l for fever, chills and achyness.   "Common cold" symptoms are usually triggered by a virus. Please, make an appointment if you are not better or if you're worse.

## 2011-04-09 NOTE — Telephone Encounter (Signed)
Pt informed

## 2011-04-22 ENCOUNTER — Telehealth: Payer: Self-pay

## 2011-04-22 NOTE — Telephone Encounter (Signed)
[facility] Triage Call Report Triage Record Num: [id number] Operator: Nancie Neas Patient Name: Casey Kelly Call Date & Time: 04/19/2011 11:34:02PM Patient Phone: [phone] PCP: Walker Kehr Patient Gender: Male PCP Fax : [phone] Patient DOB: [birth year] Practice Name: [facility] Reason for Call: Caller: Kentrel/Patient; PCP: Walker Kehr; CB#: [phone]; Call Reason: ; Sx Onset: ; Sx Notes: ; Afebrile; Wt: ; Guideline Used: ; Disp:; Appt Scheduled?: Pt takes Testerone injections q 14 days and was due 04/18/2011. [facility] / [place] states the syringe/ needle that he uses are on back order and not be available until 04/24/2011. Pt wants to know if he can resuse his needle from 2 weeks ago. RN adv do not resuse syringe and to check to see if another [employer] or another pharmacy chain has the syringe/ needle in stock and he agreed. Protocol(s) Used: Office Note Recommended Outcome per Protocol: Information Noted and Sent to Office Reason for Outcome: Caller information to office Care Advice: ~ 04/19/2011 11:40:38PM Page 1 of 1 [email or web address]

## 2011-06-24 ENCOUNTER — Other Ambulatory Visit (INDEPENDENT_AMBULATORY_CARE_PROVIDER_SITE_OTHER): Payer: 59

## 2011-06-24 ENCOUNTER — Encounter: Payer: Self-pay | Admitting: Internal Medicine

## 2011-06-24 ENCOUNTER — Ambulatory Visit (INDEPENDENT_AMBULATORY_CARE_PROVIDER_SITE_OTHER): Payer: 59 | Admitting: Internal Medicine

## 2011-06-24 VITALS — BP 150/110 | HR 76 | Temp 98.1°F | Resp 16 | Wt 285.0 lb

## 2011-06-24 DIAGNOSIS — K5090 Crohn's disease, unspecified, without complications: Secondary | ICD-10-CM

## 2011-06-24 DIAGNOSIS — I10 Essential (primary) hypertension: Secondary | ICD-10-CM

## 2011-06-24 DIAGNOSIS — G4733 Obstructive sleep apnea (adult) (pediatric): Secondary | ICD-10-CM

## 2011-06-24 DIAGNOSIS — E291 Testicular hypofunction: Secondary | ICD-10-CM

## 2011-06-24 DIAGNOSIS — N529 Male erectile dysfunction, unspecified: Secondary | ICD-10-CM

## 2011-06-24 LAB — HEPATIC FUNCTION PANEL
Alkaline Phosphatase: 70 U/L (ref 39–117)
Bilirubin, Direct: 0.1 mg/dL (ref 0.0–0.3)
Total Protein: 7.3 g/dL (ref 6.0–8.3)

## 2011-06-24 LAB — BASIC METABOLIC PANEL
CO2: 30 mEq/L (ref 19–32)
GFR: 95.31 mL/min (ref >60.00)
Glucose, Bld: 89 mg/dL (ref 70–99)
Potassium: 4.3 mEq/L (ref 3.5–5.1)
Sodium: 140 mEq/L (ref 135–145)

## 2011-06-24 LAB — CBC WITH DIFFERENTIAL/PLATELET
Basophils Absolute: 0.1 10*3/uL (ref 0.0–0.1)
Eosinophils Absolute: 0.2 10*3/uL (ref 0.0–0.7)
Hemoglobin: 16.7 g/dL (ref 13.0–17.0)
Lymphocytes Relative: 33 % (ref 12.0–46.0)
Lymphs Abs: 2.6 10*3/uL (ref 0.7–4.0)
MCHC: 32.6 g/dL (ref 30.0–36.0)
MCV: 84.1 fl (ref 78.0–100.0)
Monocytes Absolute: 0.6 10*3/uL (ref 0.1–1.0)
Neutro Abs: 4.5 10*3/uL (ref 1.4–7.7)
RDW: 14.4 % (ref 11.5–14.6)

## 2011-06-24 NOTE — Assessment & Plan Note (Signed)
He thinks it stopped working - c/o tiredness, wt gain

## 2011-06-24 NOTE — Progress Notes (Signed)
Patient ID: Casey Kelly, male   DOB: [birth year], 36 y.o.   MRN: [id number]  Subjective:    Patient ID: Casey Kelly, male    DOB: 09/15/1975, 36 y.o.   MRN: [id number]  HPI The patient presents for a follow-up of  chronic ED and hypogonadism, HTN C/o fatigue and wt gain  Wt Readings from Last 3 Encounters:  06/24/11 285 lb (129.275 kg)  04/08/11 274 lb (124.286 kg)  03/25/11 280 lb (127.007 kg)    BP Readings from Last 3 Encounters:  06/24/11 150/110  04/08/11 130/88  04/07/11 150/91        Review of Systems  Constitutional: Positive for fatigue. Negative for appetite change and unexpected weight change.  HENT: Negative for nosebleeds, congestion, sore throat, sneezing, trouble swallowing and neck pain.   Eyes: Negative for itching and visual disturbance.  Respiratory: Negative for cough.   Cardiovascular: Negative for chest pain, palpitations and leg swelling.  Gastrointestinal: Negative for nausea, diarrhea, blood in stool and abdominal distention.  Genitourinary: Negative for frequency and hematuria.  Musculoskeletal: Negative for back pain, joint swelling and gait problem.  Skin: Negative for rash.  Neurological: Negative for dizziness, tremors, speech difficulty and weakness.  Psychiatric/Behavioral: Negative for sleep disturbance, dysphoric mood and agitation. The patient is not nervous/anxious.        Objective:   Physical Exam  Constitutional: He is oriented to person, place, and time. He appears well-developed.  HENT:  Mouth/Throat: Oropharynx is clear and moist.  Eyes: Conjunctivae are normal. Pupils are equal, round, and reactive to light.  Neck: Normal range of motion. No JVD present. No thyromegaly present.  Cardiovascular: Normal rate, regular rhythm, normal heart sounds and intact distal pulses.  Exam reveals no gallop and no friction rub.   No murmur heard. Pulmonary/Chest: Effort normal and breath sounds normal. No respiratory distress. He has no  wheezes. He has no rales. He exhibits no tenderness.  Abdominal: Soft. Bowel sounds are normal. He exhibits no distension and no mass. There is no tenderness. There is no rebound and no guarding.  Musculoskeletal: Normal range of motion. He exhibits no edema and no tenderness.  Lymphadenopathy:    He has no cervical adenopathy.  Neurological: He is alert and oriented to person, place, and time. He has normal reflexes. No cranial nerve deficit. He exhibits normal muscle tone. Coordination normal.  Skin: Skin is warm and dry. No rash noted.  Psychiatric: He has a normal mood and affect. His behavior is normal. Judgment and thought content normal.     Labs reviewed Lab Results  Component Value Date   WBC 8.8 04/08/2011   HGB 15.4 04/08/2011   HCT 45.7 04/08/2011   PLT 345.0 04/08/2011   GLUCOSE 84 04/08/2011   CHOL 244* 12/08/2007   TRIG 90 12/08/2007   HDL 31.8* 12/08/2007   LDLDIRECT 196.0 12/08/2007   ALT 19 04/08/2011   AST 16 04/08/2011   NA 141 04/08/2011   K 4.8 04/08/2011   CL 103 04/08/2011   CREATININE 1.4 04/08/2011   BUN 12 04/08/2011   CO2 29 04/08/2011   TSH 1.59 10/10/2010   INR 0.9 10/10/2010       Assessment & Plan:

## 2011-06-24 NOTE — Assessment & Plan Note (Signed)
Continue with current prescription therapy as reflected on the Med list.  

## 2011-06-24 NOTE — Assessment & Plan Note (Signed)
Chronic on CPAP

## 2011-06-24 NOTE — Assessment & Plan Note (Signed)
BP nl at home Risks associated with treatment noncompliance were discussed. Compliance was encouraged.

## 2011-06-24 NOTE — Patient Instructions (Signed)
Contour pillow

## 2011-06-25 ENCOUNTER — Telehealth: Payer: Self-pay | Admitting: Internal Medicine

## 2011-06-25 DIAGNOSIS — E291 Testicular hypofunction: Secondary | ICD-10-CM

## 2011-06-25 NOTE — Telephone Encounter (Signed)
Casey Kelly, please, inform patient that all labs are normal except for elev testosterone. How is he using it? Thx

## 2011-06-26 NOTE — Telephone Encounter (Signed)
Pt informed. He states he has been using 2 ml q 14 days. Please advise.

## 2011-06-26 NOTE — Telephone Encounter (Signed)
Noted. Ok to cont ROV in 3 mo w/testost - check 2-4 d prior to next inj Thx

## 2011-07-01 NOTE — Telephone Encounter (Signed)
Pt informed

## 2011-09-29 ENCOUNTER — Other Ambulatory Visit: Payer: Self-pay | Admitting: Internal Medicine

## 2011-09-30 ENCOUNTER — Telehealth: Payer: Self-pay | Admitting: *Deleted

## 2011-09-30 MED ORDER — TESTOSTERONE CYPIONATE 200 MG/ML IM SOLN: 400.0000 mg | INTRAMUSCULAR | Status: DC

## 2011-09-30 NOTE — Telephone Encounter (Signed)
OK to fill this prescription with additional refills x0 Thank you!  

## 2011-09-30 NOTE — Telephone Encounter (Signed)
Done

## 2011-09-30 NOTE — Telephone Encounter (Signed)
Requested Medications     testosterone cypionate (DEPOTESTOTERONE CYPIONATE) 200 MG/ML injection [Pharmacy Med Name: TESTOSTERONE CYP 200 MG/ML]   INJECT 2 MLS (400 MGS TOTAL) INTO THE MUSCLE EVERY 14 DAYS   Disp: 10 mL R: 5 Start: 09/29/2011  Class: Normal   Requested on: 03/13/2011   Originally ordered on: 03/12/2011  Last refill: 06/06/2011

## 2011-10-23 ENCOUNTER — Ambulatory Visit: Payer: 59 | Admitting: Internal Medicine

## 2011-10-23 DIAGNOSIS — Z0289 Encounter for other administrative examinations: Secondary | ICD-10-CM

## 2011-12-23 ENCOUNTER — Other Ambulatory Visit: Payer: Self-pay | Admitting: Internal Medicine

## 2012-03-03 ENCOUNTER — Ambulatory Visit: Payer: 59 | Admitting: Gastroenterology

## 2012-04-21 ENCOUNTER — Encounter: Payer: 59 | Admitting: Internal Medicine

## 2012-04-21 DIAGNOSIS — Z0289 Encounter for other administrative examinations: Secondary | ICD-10-CM

## 2012-05-06 ENCOUNTER — Other Ambulatory Visit: Payer: Self-pay | Admitting: Otolaryngology

## 2012-05-06 DIAGNOSIS — J331 Polypoid sinus degeneration: Secondary | ICD-10-CM

## 2012-05-06 DIAGNOSIS — J329 Chronic sinusitis, unspecified: Secondary | ICD-10-CM

## 2012-05-11 ENCOUNTER — Ambulatory Visit
Admission: RE | Admit: 2012-05-11 | Discharge: 2012-05-11 | Disposition: A | Discharge status: Home / Self Care | Payer: 59 | Source: Ambulatory Visit | Attending: Otolaryngology | Admitting: Otolaryngology

## 2012-05-11 DIAGNOSIS — J329 Chronic sinusitis, unspecified: Secondary | ICD-10-CM

## 2012-05-11 DIAGNOSIS — J331 Polypoid sinus degeneration: Secondary | ICD-10-CM

## 2012-05-17 ENCOUNTER — Encounter (HOSPITAL_COMMUNITY): Payer: Self-pay | Admitting: Family Medicine

## 2012-05-17 ENCOUNTER — Emergency Department (HOSPITAL_COMMUNITY)
Admission: EM | Admit: 2012-05-17 | Discharge: 2012-05-17 | Disposition: A | Discharge status: Home / Self Care | Payer: 59 | Attending: Emergency Medicine | Admitting: Emergency Medicine

## 2012-05-17 ENCOUNTER — Emergency Department (HOSPITAL_COMMUNITY): Payer: 59

## 2012-05-17 DIAGNOSIS — J3489 Other specified disorders of nose and nasal sinuses: Secondary | ICD-10-CM | POA: Insufficient documentation

## 2012-05-17 DIAGNOSIS — Z8659 Personal history of other mental and behavioral disorders: Secondary | ICD-10-CM | POA: Insufficient documentation

## 2012-05-17 DIAGNOSIS — I10 Essential (primary) hypertension: Secondary | ICD-10-CM | POA: Insufficient documentation

## 2012-05-17 DIAGNOSIS — Z8669 Personal history of other diseases of the nervous system and sense organs: Secondary | ICD-10-CM | POA: Insufficient documentation

## 2012-05-17 DIAGNOSIS — Z87442 Personal history of urinary calculi: Secondary | ICD-10-CM | POA: Insufficient documentation

## 2012-05-17 DIAGNOSIS — Z8739 Personal history of other diseases of the musculoskeletal system and connective tissue: Secondary | ICD-10-CM | POA: Insufficient documentation

## 2012-05-17 DIAGNOSIS — R079 Chest pain, unspecified: Secondary | ICD-10-CM

## 2012-05-17 DIAGNOSIS — Z79899 Other long term (current) drug therapy: Secondary | ICD-10-CM | POA: Insufficient documentation

## 2012-05-17 DIAGNOSIS — Z8719 Personal history of other diseases of the digestive system: Secondary | ICD-10-CM | POA: Insufficient documentation

## 2012-05-17 LAB — CBC WITH DIFFERENTIAL/PLATELET
Basophils Relative: 0 % (ref 0–1)
Hemoglobin: 14.2 g/dL (ref 13.0–17.0)
Lymphocytes Relative: 28 % (ref 12–46)
Lymphs Abs: 2.5 10*3/uL (ref 0.7–4.0)
Monocytes Relative: 9 % (ref 3–12)
Neutro Abs: 5.5 10*3/uL (ref 1.7–7.7)
Neutrophils Relative %: 61 % (ref 43–77)
RBC: 4.97 MIL/uL (ref 4.22–5.81)
WBC: 9 10*3/uL (ref 4.0–10.5)

## 2012-05-17 LAB — COMPREHENSIVE METABOLIC PANEL
Albumin: 3.4 g/dL — ABNORMAL LOW (ref 3.5–5.2)
Alkaline Phosphatase: 90 U/L (ref 39–117)
BUN: 12 mg/dL (ref 6–23)
CO2: 24 mEq/L (ref 19–32)
Chloride: 103 mEq/L (ref 96–112)
Glucose, Bld: 106 mg/dL — ABNORMAL HIGH (ref 70–99)
Potassium: 3.9 mEq/L (ref 3.5–5.1)
Total Bilirubin: 0.5 mg/dL (ref 0.3–1.2)

## 2012-05-17 LAB — POCT I-STAT TROPONIN I: Troponin i, poc: 0 ng/mL (ref 0.00–0.08)

## 2012-05-17 NOTE — ED Provider Notes (Signed)
History     CSN: [id number]  Arrival date & time 05/17/12  0802   First MD Initiated Contact with Patient 05/17/12 0935      Chief Complaint  Patient presents with  . Chest Pain    (Consider location/radiation/quality/duration/timing/severity/associated sxs/prior treatment) HPI 37 year old male with a past medical history significant for chronic sinus congestion, hypertension, obstructive sleep apnea presents to emergency department with chief complaint of chest pain.  Patient states that he has had sharp intermittent chest pain on the left side worse with breathing for the past 2 days.  He denies any associated nausea vomiting or diaphoresis.  He did not have any shortness of breath.  Patient states that he is also began working out with his son this week including bench pressing.  Risk factors for acute coronary syndrome include obesity and hypertension.  Denies diabetes, smoking, family history of stroke or heart attack. Patient has no other complaints at this time to  Past Medical History  Diagnosis Date  . Allergic rhinitis   . Hypertension     mild  . OSA (obstructive sleep apnea)   . Bruxism   . Plantar fasciitis 2010  . History of kidney stones   . Crohn's disease     Past Surgical History  Procedure Date  . Ethmoidectomy     bilateral  . Nasal polyp surgery     Right  . Maxillary antrostomy 06/2009    Left  . Adenoidectomy   . Hand surgery     Family History  Problem Relation Age of Onset  . Allergic rhinitis Mother   . Breast cancer Mother   . Lung cancer Father   . Colon polyps Father   . Prostate cancer Father   . Cancer Father 40    lung ca  . Allergic rhinitis Daughter   . Allergic rhinitis Son   . Asthma Son   . Colon cancer Neg Hx     History  Substance Use Topics  . Smoking status: Never Smoker   . Smokeless tobacco: Never Used  . Alcohol Use: No      Review of Systems Ten systems reviewed and are negative for acute change, except as  noted in the HPI.   Allergies  Hydrochlorothiazide  Home Medications   Current Outpatient Rx  Name  Route  Sig  Dispense  Refill  . AMLODIPINE BESYLATE 5 MG PO TABS   Oral   Take 5 mg by mouth daily.         [name] LOSARTAN POTASSIUM 100 MG PO TABS   Oral   Take 100 mg by mouth daily.         Melissa Montane NASAL SPRAY NA   Nasal   Place 1-2 sprays into the nose 2 (two) times daily as needed. For nasal congestion         . NEEDLE (DISP) 18G X 1" MISC   Does not apply   1 Units by Does not apply route every 14 (fourteen) days. To draw med out of the vial   25 each   3   . SYRINGE/NEEDLE (DISP) 23G X 1-1/2" 3 ML MISC   Does not apply   1 Units by Does not apply route every 14 (fourteen) days. fot testoterone inj IM q 14 d   100 each   3     BP 164/101  Pulse 85  Temp 98.5 F (36.9 C)  Resp 22  SpO2 100%  Physical Exam Physical Exam  Nursing note and vitals reviewed. Constitutional: He appears well-developed and well-nourished. No distress.  HENT:  Head: Normocephalic and atraumatic.  Eyes: Conjunctivae normal are normal. No scleral icterus.  Neck: Normal range of motion. Neck supple.  Cardiovascular: Normal rate, regular rhythm and normal heart sounds.   Pulmonary/Chest: Effort normal and breath sounds normal. No respiratory distress.  Abdominal: Soft. There is no tenderness.  Musculoskeletal: He exhibits no edema.  Neurological: He is alert.  Skin: Skin is warm and dry. He is not diaphoretic.  Psychiatric: His behavior is normal.    ED Course  Procedures (including critical care time)  Labs Reviewed  COMPREHENSIVE METABOLIC PANEL - Abnormal; Notable for the following:    Glucose, Bld 106 (*)     Albumin 3.4 (*)     All other components within normal limits  CBC WITH DIFFERENTIAL  POCT I-STAT TROPONIN I   Dg Chest 2 View  05/17/2012  *RADIOLOGY REPORT*  Clinical Data: Left-sided chest pain  CHEST - 2 VIEW  Comparison: None.  Findings: The heart and  mediastinal contours are within normal limits. The lungs are clear. No airspace disease, pleural effusion, or pneumothorax is identified. The visualized bony thorax is unremarkable.  IMPRESSION: No acute cardiopulmonary disease   Original Report Authenticated By: Curlene Dolphin, M.D.      1. Chest pain       MDM  11:18 AM Patient with atypical chest pain.  His pain is not reproducible on exam.  First set of troponin negative. He has some hypertension and did not take his bp meds today. OSA and not wearing his cpap a home.   Date: 05/17/2012  Rate: 91  Rhythm: normal sinus rhythm  QRS Axis: normal  Intervals: normal  ST/T Wave abnormalities: Nonspecific T wave abnormality  Conduction Disutrbances: none  Narrative Interpretation:   Old EKG Reviewed:  No significant changes noted EKG is unchanged from previous.  We'll obtain second set of markers.    12:52 PM Patient is to be discharged with recommendation to follow up with PCP/ cardiology in regards to today's hospital visit. Chest pain is not likely of cardiac or pulmonary etiology d/t presentation, perc negative, VSS, no tracheal deviation, no JVD or new murmur, RRR, breath sounds equal bilaterally, EKG without acute abnormalities, negative troponin, and negative CXR. Pt has been advised start a PPI and return to the ED is CP becomes exertional, associated with diaphoresis or nausea, radiates to left jaw/arm, worsens or becomes concerning in any way. Pt appears reliable for follow up and is agreeable to discharge.       Margarita Mail, PA-C 05/19/12 1921

## 2012-05-17 NOTE — ED Notes (Signed)
Per pt sts sharp chest pain when he breathes for 2 days. sts he has been working out. sts also some pain in upper back. Denies N,V,D. Denies SOB. sts hurts when he yawns and certain positions causes pain.

## 2012-05-17 NOTE — ED Notes (Signed)
Discharge and follow up instructions reviewed. Pt verbalized understanding.  

## 2012-05-19 ENCOUNTER — Ambulatory Visit (INDEPENDENT_AMBULATORY_CARE_PROVIDER_SITE_OTHER): Payer: 59 | Admitting: Internal Medicine

## 2012-05-19 ENCOUNTER — Encounter: Payer: Self-pay | Admitting: Internal Medicine

## 2012-05-19 VITALS — BP 150/92 | HR 80 | Temp 98.0°F | Resp 16 | Wt 286.0 lb

## 2012-05-19 DIAGNOSIS — R071 Chest pain on breathing: Secondary | ICD-10-CM

## 2012-05-19 DIAGNOSIS — R0789 Other chest pain: Secondary | ICD-10-CM | POA: Insufficient documentation

## 2012-05-19 DIAGNOSIS — I10 Essential (primary) hypertension: Secondary | ICD-10-CM

## 2012-05-19 MED ORDER — VITAMIN D 1000 UNITS PO TABS: 1000.0000 [IU] | ORAL_TABLET | Freq: Every day | ORAL | Status: DC

## 2012-05-19 MED ORDER — IBUPROFEN 600 MG PO TABS: 600.0000 mg | ORAL_TABLET | Freq: Three times a day (TID) | ORAL | Status: DC | PRN

## 2012-05-19 NOTE — Assessment & Plan Note (Signed)
1/14 [facility] strain Rx: Ibuprofen  600 mg bid - tid prn ER records, labs, tests reviewed

## 2012-05-19 NOTE — Assessment & Plan Note (Signed)
Continue with current prescription therapy as reflected on the Med list.  

## 2012-05-19 NOTE — Progress Notes (Signed)
Subjective:    Chest Pain  This is a new problem. The current episode started in the past 7 days. The onset quality is sudden. The problem occurs intermittently. The pain is at a severity of 8/10. The pain is severe. The quality of the pain is described as stabbing. The pain radiates to the mid back, precordial region and left shoulder. Associated symptoms include back pain. Pertinent negatives include no cough, dizziness, nausea, palpitations or weakness. The pain is aggravated by breathing and movement.  Back Pain Associated symptoms include chest pain. Pertinent negatives include no weakness.   37 year old male with a past medical history significant for chronic sinus congestion, hypertension, obstructive sleep apnea presents to the office with chief complaint of chest pain. Patient states that he has had sharp intermittent chest pain on the left side worse with breathing for the past 2 days. He denies any associated nausea vomiting or diaphoresis. He did not have any shortness of breath. Patient states that he is also began working out with his son this week including bench pressing. Risk factors for acute coronary syndrome include obesity and hypertension. Denies diabetes, smoking, family history of stroke or heart attack.  Patient has no other complaints  The patient presents for a follow-up of  chronic ED and hypogonadism, HTN C/o fatigue and wt gain  Wt Readings from Last 3 Encounters:  05/19/12 286 lb (129.729 kg)  06/24/11 285 lb (129.275 kg)  04/08/11 274 lb (124.286 kg)    BP Readings from Last 3 Encounters:  05/19/12 150/92  05/17/12 134/87  06/24/11 150/110        Review of Systems  Constitutional: Positive for fatigue. Negative for appetite change and unexpected weight change.  HENT: Negative for nosebleeds, congestion, sore throat, sneezing, trouble swallowing and neck pain.   Eyes: Negative for itching and visual disturbance.  Respiratory: Negative for cough.     Cardiovascular: Positive for chest pain. Negative for palpitations and leg swelling.  Gastrointestinal: Negative for nausea, diarrhea, blood in stool and abdominal distention.  Genitourinary: Negative for frequency and hematuria.  Musculoskeletal: Positive for back pain. Negative for joint swelling and gait problem.  Skin: Negative for rash.  Neurological: Negative for dizziness, tremors, speech difficulty and weakness.  Psychiatric/Behavioral: Negative for sleep disturbance, dysphoric mood and agitation. The patient is not nervous/anxious.        Objective:   Physical Exam  Constitutional: He is oriented to person, place, and time. He appears well-developed.  HENT:  Mouth/Throat: Oropharynx is clear and moist.  Eyes: Conjunctivae normal are normal. Pupils are equal, round, and reactive to light.  Neck: Normal range of motion. No JVD present. No thyromegaly present.  Cardiovascular: Normal rate, regular rhythm, normal heart sounds and intact distal pulses.  Exam reveals no gallop and no friction rub.   No murmur heard. Pulmonary/Chest: Effort normal and breath sounds normal. No respiratory distress. He has no wheezes. He has no rales. He exhibits no tenderness.  Abdominal: Soft. Bowel sounds are normal. He exhibits no distension and no mass. There is no tenderness. There is no rebound and no guarding.  Musculoskeletal: Normal range of motion. He exhibits no edema and no tenderness.  Lymphadenopathy:    He has no cervical adenopathy.  Neurological: He is alert and oriented to person, place, and time. He has normal reflexes. No cranial nerve deficit. He exhibits normal muscle tone. Coordination normal.  Skin: Skin is warm and dry. No rash noted.  Psychiatric: He has a normal mood  and affect. His behavior is normal. Judgment and thought content normal.  Chest wall NT   Labs reviewed Lab Results  Component Value Date   WBC 9.0 05/17/2012   HGB 14.2 05/17/2012   HCT 42.6 05/17/2012    PLT 303 05/17/2012   GLUCOSE 106* 05/17/2012   CHOL 244* 12/08/2007   TRIG 90 12/08/2007   HDL 31.8* 12/08/2007   LDLDIRECT 196.0 12/08/2007   ALT 19 05/17/2012   AST 19 05/17/2012   NA 137 05/17/2012   K 3.9 05/17/2012   CL 103 05/17/2012   CREATININE 0.92 05/17/2012   BUN 12 05/17/2012   CO2 24 05/17/2012   TSH 1.59 10/10/2010   INR 0.9 10/10/2010       Assessment & Plan:

## 2012-05-20 NOTE — ED Provider Notes (Signed)
Medical screening examination/treatment/procedure(s) were performed by non-physician practitioner and as supervising physician I was immediately available for consultation/collaboration.   Delora Fuel, MD 27/80/04 4715

## 2012-05-22 ENCOUNTER — Telehealth: Payer: Self-pay | Admitting: Gastroenterology

## 2012-05-22 NOTE — Telephone Encounter (Signed)
Message copied by Oliva Bustard on Fri May 22, 2012 10:31 AM ------      Message from: Barron Alvine      Created: Tue Mar 03, 2012  9:01 AM       DO NOT BILL

## 2012-06-04 ENCOUNTER — Encounter: Payer: 59 | Admitting: Cardiology

## 2012-06-04 NOTE — Progress Notes (Signed)
HPI: 37 year old male for evaluation of chest pain. Seen in the emergency room on January 19 with complaints of chest pain. Chest x-ray negative. Troponin negative. Hemoglobin and renal function normal. Liver functions normal. Patient developed to have musculoskeletal pain by primary care and started on ibuprofen.  Current Outpatient Prescriptions  Medication Sig Dispense Refill  . amLODipine (NORVASC) 5 MG tablet Take 5 mg by mouth daily.      . cholecalciferol (VITAMIN D) 1000 UNITS tablet Take 1 tablet (1,000 Units total) by mouth daily.  100 tablet  3  . ibuprofen (ADVIL,MOTRIN) 600 MG tablet Take 1 tablet (600 mg total) by mouth every 8 (eight) hours as needed for pain.  60 tablet  0  . losartan (COZAAR) 100 MG tablet Take 100 mg by mouth daily.      [name] NEEDLE, DISP, 18 G (B-D DISP NEEDLE TW 18GX1") 18G X 1" MISC 1 Units by Does not apply route every 14 (fourteen) days. To draw med out of the vial  25 each  3  . Oxymetazoline HCl (AFRIN NASAL SPRAY NA) Place 1-2 sprays into the nose 2 (two) times daily as needed. For nasal congestion      . SYRINGE-NEEDLE, DISP, 3 ML (B-D 3CC LUER-LOK SYR 23GX1-1/2) 23G X 1-1/2" 3 ML MISC 1 Units by Does not apply route every 14 (fourteen) days. fot testoterone inj IM q 14 d  100 each  3    Allergies  Allergen Reactions  . Hydrochlorothiazide Other (See Comments)    unknown    Past Medical History  Diagnosis Date  . Allergic rhinitis   . Hypertension     mild  . OSA (obstructive sleep apnea)   . Bruxism   . Plantar fasciitis 2010  . History of kidney stones   . Crohn's disease     Past Surgical History  Procedure Date  . Ethmoidectomy     bilateral  . Nasal polyp surgery     Right  . Maxillary antrostomy 06/2009    Left  . Adenoidectomy   . Hand surgery     History   Social History  . Marital Status: Married    Spouse Name: N/A    Number of Children: 3  . Years of Education: N/A   Occupational History  . BARBER   . [employer] Other   Social History Main Topics  . Smoking status: Never Smoker   . Smokeless tobacco: Never Used  . Alcohol Use: No  . Drug Use: No  . Sexually Active: Yes   Other Topics Concern  . Not on file   Social History Narrative   Regular exercise- No1 Caffeine drink daily     Family History  Problem Relation Age of Onset  . Allergic rhinitis Mother   . Breast cancer Mother   . Lung cancer Father   . Colon polyps Father   . Prostate cancer Father   . Cancer Father 41    lung ca  . Allergic rhinitis Daughter   . Allergic rhinitis Son   . Asthma Son   . Colon cancer Neg Hx     ROS: no fevers or chills, productive cough, hemoptysis, dysphasia, odynophagia, melena, hematochezia, dysuria, hematuria, rash, seizure activity, orthopnea, PND, pedal edema, claudication. Remaining systems are negative.  Physical Exam:   There were no vitals taken for this visit.  General:  Well developed/well nourished in NAD Skin warm/dry Patient not depressed No peripheral clubbing Back-normal HEENT-normal/normal eyelids Neck supple/normal carotid  upstroke bilaterally; no bruits; no JVD; no thyromegaly chest - CTA/ normal expansion CV - RRR/normal S1 and S2; no murmurs, rubs or gallops;  PMI nondisplaced Abdomen -NT/ND, no HSM, no mass, + bowel sounds, no bruit 2+ femoral pulses, no bruits Ext-no edema, chords, 2+ DP Neuro-grossly nonfocal  ECG 05/17/2012-sinus rhythm with nonspecific ST changes.   This encounter was created in error - please disregard. This encounter was created in error - please disregard.

## 2012-07-20 ENCOUNTER — Encounter (HOSPITAL_BASED_OUTPATIENT_CLINIC_OR_DEPARTMENT_OTHER): Payer: Self-pay | Admitting: *Deleted

## 2012-07-20 NOTE — Pre-Procedure Instructions (Signed)
To come for BMET

## 2012-07-20 NOTE — Progress Notes (Signed)
This pt. has screened at an elevated risk for obstructive sleep apnea using the STOP-Bang tool during a pre-surgical visit.  A score of 4 or greater is an elevated risk.  He already has a CPAP machine at home, but rarely uses it, states maybe twice a month.  07/20/12 1657  OBSTRUCTIVE SLEEP APNEA  Have you ever been diagnosed with sleep apnea through a sleep study? Yes  If yes, do you have and use a CPAP or BPAP machine every night? 0 (only uses a few times/month)  Do you snore loudly (loud enough to be heard through closed doors)?  1  Do you often feel tired, fatigued, or sleepy during the daytime? 1  Has anyone observed you stop breathing during your sleep? 1  Do you have, or are you being treated for high blood pressure? 1  Age over 73 years old? 0  Gender: 1  Obstructive Sleep Apnea Score 5  Score 4 or greater  Results sent to PCP

## 2012-07-20 NOTE — Progress Notes (Deleted)
This pt. has screened at an elevated risk for obstructive sleep apnea using the STOP-Bang tool during a pre-surgical visit.  A score of 4 or greater is an elevated risk.  He already has a CPAP machine at home, but rarely uses it, states maybe twice a month.

## 2012-07-21 NOTE — Pre-Procedure Instructions (Signed)
Hx. of sleep apnea and non-compliant CPAP use reviewed with Dr. Al Corpus; pt. may come for surgery, but will need to stay overnight.

## 2012-07-21 NOTE — Pre-Procedure Instructions (Signed)
Pt. notified that he will need to stay overnight; to bring CPAP machine, meds. and overnight bag.  Ivin Booty at Dr. Janeice Robinson office also notified that pt. will need to stay overnight (voicemail message left).

## 2012-07-22 NOTE — H&P (Signed)
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  Esophageal reflux (530.81) (K21.9). Orders  Fluticasone Propionate 50 MCG/ACT Nasal Suspension;USE 2 SPRAYS IN EACH NOSTRIL ONCE DAILY; Qty1; R12; Rx. Discussed  He's doing much better since completing the medication and getting off of the decongestant spray. He's using some exudate or rub which really helped a lot. He's much clearer now. He does have some mucus buildup in his throat he wakes up in the morning and some heartburn. He drinks coke and [facility] with some regularity. On exam, the left nasal cavity looks all clear. There is some polypoid swelling posteriorly on the right but no exudate and he does have an open airway is now. Recommend work on limiting the caffeine consumption and eventually quitting. Recommend we get him on a steroid nasal spray to see if we can get additional improvement of the polypoid changes. If he stopped getting better or gets any worse, followup for additional treatment. Reason For Visit  Sinus. Allergies  No Known Drug Allergies. Current Meds  Unknown;blood pressure medicine; RPT AmLODIPine Besylate 5 MG Oral Tablet;; RPT; Status: TEMPORARY DEFERRAL  Losartan Potassium 100 MG Oral Tablet;; RPT; Status: TEMPORARY DEFERRAL. 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 PSH  Oral Surgery Tooth Extraction. Family Hx  Family history of hypertension (V17.49) (Z83.49) Family history of lung cancer: Father (V16.1) (Z80.1) Family history of malignant neoplasm of breast: Mother (V16.3) (Z80.3). Personal Hx  Never smoker No alcohol use No caffeine use Non-smoker (V49.89) (Z78.9). Signature  Electronically signed by : Izora Gala  M.D.; 05/19/2012 10:12 AM EST.

## 2012-08-14 ENCOUNTER — Encounter: Payer: Self-pay | Admitting: Cardiology

## 2012-08-17 ENCOUNTER — Encounter: Payer: Self-pay | Admitting: *Deleted

## 2012-08-17 ENCOUNTER — Encounter (HOSPITAL_BASED_OUTPATIENT_CLINIC_OR_DEPARTMENT_OTHER): Payer: Self-pay | Admitting: *Deleted

## 2012-08-17 ENCOUNTER — Encounter: Payer: Self-pay | Admitting: Internal Medicine

## 2012-08-17 ENCOUNTER — Ambulatory Visit (INDEPENDENT_AMBULATORY_CARE_PROVIDER_SITE_OTHER): Payer: 59 | Admitting: Internal Medicine

## 2012-08-17 ENCOUNTER — Other Ambulatory Visit (INDEPENDENT_AMBULATORY_CARE_PROVIDER_SITE_OTHER): Payer: 59

## 2012-08-17 VITALS — BP 168/108 | HR 72 | Temp 99.0°F | Resp 16 | Wt 282.0 lb

## 2012-08-17 DIAGNOSIS — E291 Testicular hypofunction: Secondary | ICD-10-CM

## 2012-08-17 DIAGNOSIS — I10 Essential (primary) hypertension: Secondary | ICD-10-CM

## 2012-08-17 DIAGNOSIS — M545 Low back pain, unspecified: Secondary | ICD-10-CM | POA: Insufficient documentation

## 2012-08-17 LAB — BASIC METABOLIC PANEL
Calcium: 9.2 mg/dL (ref 8.4–10.5)
Creatinine, Ser: 1.1 mg/dL (ref 0.4–1.5)

## 2012-08-17 LAB — CBC WITH DIFFERENTIAL/PLATELET
Basophils Absolute: 0 10*3/uL (ref 0.0–0.1)
Eosinophils Absolute: 0.2 10*3/uL (ref 0.0–0.7)
HCT: 44.8 % (ref 39.0–52.0)
Hemoglobin: 14.9 g/dL (ref 13.0–17.0)
Lymphocytes Relative: 23.4 % (ref 12.0–46.0)
Lymphs Abs: 1.3 10*3/uL (ref 0.7–4.0)
MCHC: 33.2 g/dL (ref 30.0–36.0)
Neutro Abs: 3 10*3/uL (ref 1.4–7.7)
Platelets: 331 10*3/uL (ref 150.0–400.0)
RDW: 12.8 % (ref 11.5–14.6)

## 2012-08-17 LAB — SEDIMENTATION RATE: Sed Rate: 14 mm/hr (ref 0–22)

## 2012-08-17 LAB — URINALYSIS, ROUTINE W REFLEX MICROSCOPIC
Bilirubin Urine: NEGATIVE
Leukocytes, UA: NEGATIVE
Nitrite: NEGATIVE
Urobilinogen, UA: 0.2 (ref 0.0–1.0)

## 2012-08-17 LAB — TESTOSTERONE: Testosterone: 232.09 ng/dL — ABNORMAL LOW (ref 350.00–890.00)

## 2012-08-17 MED ORDER — CANDESARTAN CILEXETIL 32 MG PO TABS: 32.0000 mg | ORAL_TABLET | Freq: Every day | ORAL | Status: DC

## 2012-08-17 MED ORDER — VITAMIN D 1000 UNITS PO TABS: 1000.0000 [IU] | ORAL_TABLET | Freq: Every day | ORAL | Status: AC

## 2012-08-17 MED ORDER — AMLODIPINE BESYLATE 5 MG PO TABS: 5.0000 mg | ORAL_TABLET | Freq: Every day | ORAL | Status: DC

## 2012-08-17 NOTE — Assessment & Plan Note (Signed)
Change to Atacand from Cozaar

## 2012-08-17 NOTE — Assessment & Plan Note (Signed)
4/14 ? etiol ?[facility] UA, labs LS xray, abd [place] if not resolved

## 2012-08-17 NOTE — Assessment & Plan Note (Signed)
Off rx

## 2012-08-17 NOTE — Progress Notes (Signed)
Subjective:    Back Pain This is a new problem. The current episode started 1 to 4 weeks ago (3 wks). The problem occurs intermittently. The pain is present in the lumbar spine (R). The pain is at a severity of 6/10. The pain is moderate. The pain is worse during the day. The symptoms are aggravated by bending and twisting. Stiffness is present all day. Pertinent negatives include no abdominal pain, dysuria or weakness. Risk factors include obesity. He has tried analgesics and NSAIDs for the symptoms. The treatment provided mild relief.   37 year old male with a past medical history significant for chronic sinus congestion, hypertension, obstructive sleep apnea presents to the office with chief complaint of chest pain. Patient states that he has had sharp intermittent chest pain on the left side worse with breathing for the past 2 days. He denies any associated nausea vomiting or diaphoresis. He did not have any shortness of breath. Patient states that he is also began working out with his son this week including bench pressing. Risk factors for acute coronary syndrome include obesity and hypertension. Denies diabetes, smoking, family history of stroke or heart attack.  Patient has no other complaints  The patient presents for a follow-up of  chronic ED and hypogonadism, HTN C/o fatigue and wt gain  Wt Readings from Last 3 Encounters:  08/17/12 282 lb (127.914 kg)  07/20/12 280 lb (127.007 kg)  05/19/12 286 lb (129.729 kg)    BP Readings from Last 3 Encounters:  08/17/12 168/108  05/19/12 150/92  05/17/12 134/87        Review of Systems  Constitutional: Positive for fatigue. Negative for appetite change and unexpected weight change.  HENT: Negative for nosebleeds, congestion, sore throat, sneezing, trouble swallowing and neck pain.   Eyes: Negative for itching and visual disturbance.  Cardiovascular: Negative for leg swelling.  Gastrointestinal: Negative for abdominal pain,  diarrhea, blood in stool and abdominal distention.  Genitourinary: Negative for dysuria, frequency and hematuria.  Musculoskeletal: Positive for back pain. Negative for joint swelling and gait problem.  Skin: Negative for rash.  Neurological: Negative for tremors, speech difficulty and weakness.  Psychiatric/Behavioral: Negative for sleep disturbance, dysphoric mood and agitation. The patient is not nervous/anxious.        Objective:   Physical Exam  Constitutional: He is oriented to person, place, and time. He appears well-developed.  HENT:  Mouth/Throat: Oropharynx is clear and moist.  Eyes: Conjunctivae are normal. Pupils are equal, round, and reactive to light.  Neck: Normal range of motion. No JVD present. No thyromegaly present.  Cardiovascular: Normal rate, regular rhythm, normal heart sounds and intact distal pulses.  Exam reveals no gallop and no friction rub.   No murmur heard. Pulmonary/Chest: Effort normal and breath sounds normal. No respiratory distress. He has no wheezes. He has no rales. He exhibits no tenderness.  Abdominal: Soft. Bowel sounds are normal. He exhibits no distension and no mass. There is no tenderness. There is no rebound and no guarding.  Musculoskeletal: Normal range of motion. He exhibits no edema and no tenderness.  Lymphadenopathy:    He has no cervical adenopathy.  Neurological: He is alert and oriented to person, place, and time. He has normal reflexes. No cranial nerve deficit. He exhibits normal muscle tone. Coordination normal.  Skin: Skin is warm and dry. No rash noted.  Psychiatric: He has a normal mood and affect. His behavior is normal. Judgment and thought content normal.  Chest wall NT LS NT  Labs reviewed Lab Results  Component Value Date   WBC 9.0 05/17/2012   HGB 14.2 05/17/2012   HCT 42.6 05/17/2012   PLT 303 05/17/2012   GLUCOSE 106* 05/17/2012   CHOL 244* 12/08/2007   TRIG 90 12/08/2007   HDL 31.8* 12/08/2007   LDLDIRECT 196.0  12/08/2007   ALT 19 05/17/2012   AST 19 05/17/2012   NA 137 05/17/2012   K 3.9 05/17/2012   CL 103 05/17/2012   CREATININE 0.92 05/17/2012   BUN 12 05/17/2012   CO2 24 05/17/2012   TSH 1.59 10/10/2010   INR 0.9 10/10/2010       Assessment & Plan:

## 2012-08-17 NOTE — Patient Instructions (Addendum)
Wt Readings from Last 3 Encounters:  08/17/12 282 lb (127.914 kg)  07/20/12 280 lb (127.007 kg)  05/19/12 286 lb (129.729 kg)   [email or web address]

## 2012-08-18 ENCOUNTER — Other Ambulatory Visit: Payer: Self-pay | Admitting: *Deleted

## 2012-08-18 MED ORDER — TESTOSTERONE CYPIONATE 200 MG/ML IM SOLN: 400.0000 mg | INTRAMUSCULAR | Status: DC

## 2012-08-18 MED ORDER — AMLODIPINE BESYLATE 5 MG PO TABS: 5.0000 mg | ORAL_TABLET | Freq: Every day | ORAL | Status: DC

## 2012-08-18 MED ORDER — CANDESARTAN CILEXETIL 32 MG PO TABS: 32.0000 mg | ORAL_TABLET | Freq: Every day | ORAL | Status: DC

## 2012-08-18 NOTE — Telephone Encounter (Signed)
Message copied by Cresenciano Lick on Tue Aug 18, 2012 11:11 AM ------      Message from: Cassandria Anger      Created: Mon Aug 17, 2012  9:11 PM       Erline Levine, please, inform patient that all labs are normal except for a low testosterone. UA is ok. Call if not better pls      Thx       ------

## 2012-08-18 NOTE — Telephone Encounter (Signed)
Ok to re-start. Start with 200 mg dose first  Thx

## 2012-08-18 NOTE — Telephone Encounter (Signed)
Pt informed

## 2012-08-18 NOTE — Telephone Encounter (Signed)
Pt informed of below. He states he is feeling better.  He wants to re- start Testosterone injections. Please advise.

## 2012-08-19 NOTE — Progress Notes (Signed)
Called pt for preadmission history, he states that he is "sick" and would like to reschedule. Told pt to call Dr Janeice Robinson office to cancel and then to reschedule for a later date.

## 2012-08-24 ENCOUNTER — Ambulatory Visit (HOSPITAL_BASED_OUTPATIENT_CLINIC_OR_DEPARTMENT_OTHER): Admission: RE | Admit: 2012-08-24 | Payer: 59 | Source: Ambulatory Visit | Admitting: Otolaryngology

## 2012-08-24 SURGERY — SINUS SURGERY, ENDOSCOPIC
Anesthesia: General | Laterality: Right

## 2012-08-25 ENCOUNTER — Ambulatory Visit: Payer: 59 | Admitting: Internal Medicine

## 2012-09-07 ENCOUNTER — Telehealth: Payer: Self-pay | Admitting: Internal Medicine

## 2012-09-07 NOTE — Telephone Encounter (Signed)
[facility] Triage Call Report Triage Record Num: [id number] Operator: Gordy Clement Patient Name: Casey Kelly Call Date & Time: 09/05/2012 9:18:56AM Patient Phone: [phone] PCP: Walker Kehr Patient Gender: Male PCP Fax : [phone] Patient DOB: 03-Jan-1976 Practice Name[facility] Reason for Call: Caller: [name]/Patient; PCP: Walker Kehr; CB#: [phone]; Call regarding Patient Forgot Which Medication He Needs To Take for His BP; Patient says he can't remember if he was to stop the Cozaar. Reviewed emr, seen in office 4/21. Atacand 66m daily and Norvasc 5 mg daily ordered. Cozaar changed to Atacand. Patient advised scripts were sent to his pharmacy. Protocol(s) Used: Medication Questions - Adult Recommended Outcome per Protocol: Provided Health Information Reason for Outcome: Caller has medication question(s) that was answered with available resources

## 2012-10-01 MED ORDER — BACITRACIN ZINC 500 UNIT/GM EX OINT
TOPICAL_OINTMENT | CUTANEOUS | Status: AC
  Filled 2012-10-01: qty 15

## 2012-10-07 ENCOUNTER — Ambulatory Visit: Payer: 59 | Admitting: Gastroenterology

## 2012-10-14 ENCOUNTER — Telehealth: Payer: Self-pay | Admitting: *Deleted

## 2012-10-14 NOTE — Telephone Encounter (Signed)
Pt left vm stating his new BP med is causing leg edema. What should he do?

## 2012-10-15 NOTE — Telephone Encounter (Signed)
Take Amlodipine 1/2 tab a day. Call if not better in 2 wks Thx

## 2012-10-15 NOTE — Telephone Encounter (Signed)
Left mess for patient to call back.  

## 2012-10-21 NOTE — Telephone Encounter (Signed)
Patient notified per MD.

## 2012-11-16 ENCOUNTER — Ambulatory Visit: Payer: 59 | Admitting: Internal Medicine

## 2012-11-23 ENCOUNTER — Ambulatory Visit: Payer: 59 | Admitting: Gastroenterology

## 2013-01-13 ENCOUNTER — Telehealth: Payer: Self-pay | Admitting: Gastroenterology

## 2013-01-13 NOTE — Telephone Encounter (Signed)
Message copied by Oliva Bustard on Wed Jan 13, 2013  2:18 PM ------      Message from: Barron Alvine      Created: Mon Nov 23, 2012  2:31 PM       [facility] PT  ------

## 2013-01-19 ENCOUNTER — Other Ambulatory Visit: Payer: Self-pay | Admitting: Internal Medicine

## 2013-02-23 ENCOUNTER — Other Ambulatory Visit (INDEPENDENT_AMBULATORY_CARE_PROVIDER_SITE_OTHER): Payer: 59

## 2013-02-23 ENCOUNTER — Ambulatory Visit (INDEPENDENT_AMBULATORY_CARE_PROVIDER_SITE_OTHER): Payer: 59 | Admitting: Internal Medicine

## 2013-02-23 ENCOUNTER — Encounter: Payer: Self-pay | Admitting: Internal Medicine

## 2013-02-23 VITALS — BP 148/88 | HR 80 | Temp 97.9°F | Resp 16 | Wt 269.0 lb

## 2013-02-23 DIAGNOSIS — N50811 Right testicular pain: Secondary | ICD-10-CM | POA: Insufficient documentation

## 2013-02-23 DIAGNOSIS — M545 Low back pain, unspecified: Secondary | ICD-10-CM

## 2013-02-23 DIAGNOSIS — N509 Disorder of male genital organs, unspecified: Secondary | ICD-10-CM

## 2013-02-23 DIAGNOSIS — E291 Testicular hypofunction: Secondary | ICD-10-CM

## 2013-02-23 LAB — CBC WITH DIFFERENTIAL/PLATELET
Eosinophils Relative: 2.4 % (ref 0.0–5.0)
HCT: 45.8 % (ref 39.0–52.0)
Hemoglobin: 15.4 g/dL (ref 13.0–17.0)
Lymphs Abs: 2.7 10*3/uL (ref 0.7–4.0)
Monocytes Relative: 6.1 % (ref 3.0–12.0)
Neutro Abs: 4.6 10*3/uL (ref 1.4–7.7)
Platelets: 437 10*3/uL — ABNORMAL HIGH (ref 150.0–400.0)
WBC: 7.9 10*3/uL (ref 4.5–10.5)

## 2013-02-23 LAB — URINALYSIS, ROUTINE W REFLEX MICROSCOPIC
Bilirubin Urine: NEGATIVE
Leukocytes, UA: NEGATIVE
Specific Gravity, Urine: >=1.03 (ref 1.000–1.030)
Total Protein, Urine: NEGATIVE
pH: 6 (ref 5.0–8.0)

## 2013-02-23 LAB — BASIC METABOLIC PANEL
GFR: 105.35 mL/min (ref >60.00)
Potassium: 4.1 mEq/L (ref 3.5–5.1)
Sodium: 138 mEq/L (ref 135–145)

## 2013-02-23 NOTE — Assessment & Plan Note (Signed)
10/14 ?etiol Nl exam  Urol ref offered Support briefs Labs

## 2013-02-23 NOTE — Assessment & Plan Note (Signed)
See below

## 2013-02-23 NOTE — Assessment & Plan Note (Signed)
Better  

## 2013-02-23 NOTE — Progress Notes (Signed)
Subjective:    Back Pain This is a new problem. The current episode started 1 to 4 weeks ago (3 wks). The problem occurs intermittently. The pain is present in the lumbar spine (R). The pain is at a severity of 6/10. The pain is moderate. The pain is worse during the day. The symptoms are aggravated by bending and twisting. Stiffness is present all day. Pertinent negatives include no abdominal pain, dysuria or weakness. Risk factors include obesity. He has tried analgesics and NSAIDs for the symptoms. The treatment provided mild relief.  Testicle Pain The patient's primary symptoms include testicular pain. The patient's pertinent negatives include no genital injury or penile discharge. This is a recurrent problem. The current episode started 1 to 4 weeks ago. The problem occurs intermittently. The pain is medium. Pertinent negatives include no abdominal pain, diarrhea, dysuria, frequency, rash or sore throat. The testicular pain affects the right testicle. He has tried nothing for the symptoms. His past medical history is significant for cryptorchidism.   37 year old male with a past medical history significant for chronic sinus congestion, hypertension, obstructive sleep apnea presents to the office with chief complaint of chest pain. Patient states that he has had sharp intermittent chest pain on the left side worse with breathing for the past 2 days. He denies any associated nausea vomiting or diaphoresis. He did not have any shortness of breath. Patient states that he is also began working out with his son this week including bench pressing. Risk factors for acute coronary syndrome include obesity and hypertension. Denies diabetes, smoking, family history of stroke or heart attack.  Patient has no other complaints  The patient presents for a follow-up of  chronic ED and hypogonadism, HTN Less fatigue and - lost wt  Wt Readings from Last 3 Encounters:  02/23/13 269 lb (122.018 kg)  07/20/12  280 lb (127.007 kg)  08/17/12 282 lb (127.914 kg)    BP Readings from Last 3 Encounters:  02/23/13 148/88  08/17/12 168/108  05/19/12 150/92        Review of Systems  Constitutional: Positive for fatigue. Negative for appetite change and unexpected weight change.  HENT: Negative for congestion, nosebleeds, sneezing, sore throat and trouble swallowing.   Eyes: Negative for itching and visual disturbance.  Cardiovascular: Negative for leg swelling.  Gastrointestinal: Negative for abdominal pain, diarrhea, blood in stool and abdominal distention.  Genitourinary: Positive for testicular pain. Negative for dysuria, frequency, hematuria and discharge.  Musculoskeletal: Positive for back pain. Negative for gait problem, joint swelling and neck pain.  Skin: Negative for rash.  Neurological: Negative for tremors, speech difficulty and weakness.  Psychiatric/Behavioral: Negative for sleep disturbance, dysphoric mood and agitation. The patient is not nervous/anxious.        Objective:   Physical Exam  Constitutional: He is oriented to person, place, and time. He appears well-developed.  HENT:  Mouth/Throat: Oropharynx is clear and moist.  Eyes: Conjunctivae are normal. Pupils are equal, round, and reactive to light.  Neck: Normal range of motion. No JVD present. No thyromegaly present.  Cardiovascular: Normal rate, regular rhythm, normal heart sounds and intact distal pulses.  Exam reveals no gallop and no friction rub.   No murmur heard. Pulmonary/Chest: Effort normal and breath sounds normal. No respiratory distress. He has no wheezes. He has no rales. He exhibits no tenderness.  Abdominal: Soft. Bowel sounds are normal. He exhibits no distension and no mass. There is no tenderness. There is no rebound and no guarding.  Musculoskeletal: Normal range of motion. He exhibits no edema and no tenderness.  Lymphadenopathy:    He has no cervical adenopathy.  Neurological: He is alert and  oriented to person, place, and time. He has normal reflexes. No cranial nerve deficit. He exhibits normal muscle tone. Coordination normal.  Skin: Skin is warm and dry. No rash noted.  Psychiatric: He has a normal mood and affect. His behavior is normal. Judgment and thought content normal.  Chest wall NT LS NT B testes WNL, no mass. NT No hernias   Labs reviewed Lab Results  Component Value Date   WBC 5.5 08/17/2012   HGB 14.9 08/17/2012   HCT 44.8 08/17/2012   PLT 331.0 08/17/2012   GLUCOSE 82 08/17/2012   CHOL 244* 12/08/2007   TRIG 90 12/08/2007   HDL 31.8* 12/08/2007   LDLDIRECT 196.0 12/08/2007   ALT 19 05/17/2012   AST 19 05/17/2012   NA 139 08/17/2012   K 4.6 08/17/2012   CL 104 08/17/2012   CREATININE 1.1 08/17/2012   BUN 14 08/17/2012   CO2 28 08/17/2012   TSH 1.59 10/10/2010   INR 0.9 10/10/2010       Assessment & Plan:

## 2013-02-23 NOTE — Patient Instructions (Signed)
Use athletic support briefs  Wt Readings from Last 3 Encounters:  02/23/13 269 lb (122.018 kg)  07/20/12 280 lb (127.007 kg)  08/17/12 282 lb (127.914 kg)     There are natural ways to boost your testosterone:  1. Lose Weight If you're overweight, shedding the excess pounds may increase your testosterone levels, according to multiple research. Overweight men are more likely to have low testosterone levels to begin with, so this is an important trick to increase your body's testosterone production when you need it most.   2. Strength Training    Strength training is also known to boost testosterone levels, provided you are doing so intensely enough. When strength training to boost testosterone, you'll want to increase the weight and lower your number of reps, and then focus on exercises that work a large number of muscles.  3. Optimize Your Vitamin D Levels Vitamin D, a steroid hormone, is essential for the healthy development of the nucleus of the sperm cell, and helps maintain semen quality and sperm count. Vitamin D also increases levels of testosterone, which may boost libido. In one study, overweight men who were given vitamin D supplements had a significant increase in testosterone levels after one year.  4. Reduce Stress When you're under a lot of stress, your body releases high levels of the stress hormone cortisol. This hormone actually blocks the effects of testosterone, presumably because, from a biological standpoint, testosterone-associated behaviors (mating, competing, aggression) may have lowered your chances of survival in an emergency (hence, the "fight or flight" response is dominant, courtesy of cortisol).  5. Limit or Eliminate Sugar from Your Diet Testosterone levels decrease after you eat sugar, which is likely because the sugar leads to a high insulin level, another factor leading to low testosterone.  6. Eat Healthy Fats By healthy, this means not only mon- and  polyunsaturated fats, like that found in avocadoes and nuts, but also saturated, as these are essential for building testosterone. Research shows that a diet with less than 40 percent of energy as fat (and that mainly from animal sources, i.e. saturated) lead to a decrease in testosterone levels.  It's important to understand that your body requires saturated fats from animal and vegetable sources (such as meat, dairy, certain oils, and tropical plants like coconut) for optimal functioning, and if you neglect this important food group in favor of sugar, grains and other starchy carbs, your health and weight are almost guaranteed to suffer. Examples of healthy fats you can eat more of to give your testosterone levels a boost include:  Olives and Olive oil  Coconuts and coconut oil Butter made from organic milk  Raw nuts, such as, almonds or pecans Eggs Avocados   Meats Palm oil Unheated organic nut oils   7. "Testosterone boosters" containing Vitamin D-3, Niacin, Vitamin B-6, Vitamin B-12, Magnesium, Zinc, Selenium, D-Aspartic Acid, Fenugreed Seed Extract, Oystershell, Suma Extract, [place] Ginseng may be helpful as well.

## 2013-02-24 ENCOUNTER — Telehealth: Payer: Self-pay

## 2013-02-24 NOTE — Telephone Encounter (Signed)
Patient informed of results.  

## 2013-02-24 NOTE — Telephone Encounter (Signed)
Message copied by Hanley Seamen on Wed Feb 24, 2013 10:59 AM ------      Message from: Cassandria Anger      Created: Tue Feb 23, 2013 11:49 PM       Erline Levine, please, inform patient that all labs are OK      Thank you!       ------

## 2013-03-29 ENCOUNTER — Ambulatory Visit (INDEPENDENT_AMBULATORY_CARE_PROVIDER_SITE_OTHER): Payer: 59 | Admitting: Gastroenterology

## 2013-03-29 ENCOUNTER — Encounter: Payer: Self-pay | Admitting: Gastroenterology

## 2013-03-29 VITALS — BP 156/96 | HR 72 | Ht 75.0 in | Wt 268.0 lb

## 2013-03-29 DIAGNOSIS — Z9119 Patient's noncompliance with other medical treatment and regimen: Secondary | ICD-10-CM

## 2013-03-29 DIAGNOSIS — Z91199 Patient's noncompliance with other medical treatment and regimen due to unspecified reason: Secondary | ICD-10-CM

## 2013-03-29 DIAGNOSIS — K5090 Crohn's disease, unspecified, without complications: Secondary | ICD-10-CM

## 2013-03-29 MED ORDER — HYOSCYAMINE SULFATE ER 0.375 MG PO TB12: 0.3750 mg | ORAL_TABLET | Freq: Two times a day (BID) | ORAL | Status: DC

## 2013-03-29 NOTE — Progress Notes (Signed)
Review of pertinent gastrointestinal problems:  1. Crohn's disease, diagnosed September 2012 when he presented with intermittent rectal bleeding, loose stools. Colonoscopy September 2012 showed 20 cm segment in his left colon with chronic, also active colitis on biopsy. Terminal ileum found active ileitis as well on biopsy. TPMT enzyme level was normal. Small bowel follow-through was also normal.  01/2011 he was started on azathiaprine 255m per day, then no -showed for three appts from 2012 until 03/2013.   HPI: This is very pleasant 37year old man whom I last saw a little over 2 years ago.  he never tried the recommended azathiaprine.  He stopped prednisone shortly after that visit.  For two years he's been fine, no bleeding.  Alternates constipation, diarrhea. No rashes on skin, no eye infections.  Sometimes has solid stools, sometimes looser than others.  He has been having right sided back pain, low.  No urinary bleeding.  Having a BM can sometimes help the pain.    He also has crampy related abdominal pains are improved when he moves his bowels or passes gas.  He has some minor mucous discharge at times   Past Medical History  Diagnosis Date  . Bruxism   . Plantar fasciitis 2010    bilateral  . History of kidney stones   . Crohn's disease     no current med.  .Marland KitchenHeadache(784.0)     sinus  . Chronic sinusitis 06/2012  . Hypertension     under control with med., has been on med. x 4 yr.  . OSA (obstructive sleep apnea)     rarely uses CPAP    Past Surgical History  Procedure Laterality Date  . Ethmoidectomy Bilateral 07/17/2009    anterior endoscopic  . Nasal polyp surgery Right 07/17/2009  . Maxillary antrostomy Bilateral 07/17/2009    endoscopic  . Adenoidectomy      as a child  . Hand surgery Right     fingertip amputation  . Tympanostomy tube placement      as a child    Current Outpatient Prescriptions  Medication Sig Dispense Refill  . amLODipine (NORVASC) 5 MG  tablet Take 1 tablet (5 mg total) by mouth daily.  30 tablet  11  . losartan (COZAAR) 100 MG tablet Take 100 mg by mouth daily.      . candesartan (ATACAND) 32 MG tablet Take 1 tablet (32 mg total) by mouth daily.  30 tablet  11  . cholecalciferol (VITAMIN D) 1000 UNITS tablet Take 1 tablet (1,000 Units total) by mouth daily.  100 tablet  3  . ibuprofen (ADVIL,MOTRIN) 600 MG tablet Take 1 tablet (600 mg total) by mouth every 8 (eight) hours as needed for pain.  60 tablet  0   No current facility-administered medications for this visit.    Allergies as of 03/29/2013  . (No Known Allergies)    Family History  Problem Relation Age of Onset  . Allergic rhinitis Mother   . Breast cancer Mother   . Lung cancer Father   . Colon polyps Father   . Prostate cancer Father   . Cancer Father 668   lung ca  . Allergic rhinitis Daughter   . Allergic rhinitis Son   . Asthma Son     History   Social History  . Marital Status: Married    Spouse Name: N/A    Number of Children: 3  . Years of Education: N/A   Occupational History  . BARBER   .  [employer] Other   Social History Main Topics  . Smoking status: Never Smoker   . Smokeless tobacco: Never Used  . Alcohol Use: No  . Drug Use: No  . Sexual Activity: Yes   Other Topics Concern  . Not on file   Social History Narrative   Regular exercise- No   1 Caffeine drink daily       Physical Exam: BP 156/96  Pulse 72  Ht 6' 3"  (1.905 m)  Wt 268 lb (121.564 kg)  BMI 33.50 kg/m2 Constitutional: generally well-appearing Psychiatric: alert and oriented x3 Abdomen: soft, nontender, nondistended, no obvious ascites, no peritoneal signs, normal bowel sounds     Assessment and plan: 37 y.o. male with history of Crohn's ileocolitis  I have not seen him in 2 years. He no showed for 3 appointments since then. He is also completely off of any Crohn's medicines. That being said he has been doing very well for several months, 2  years. He does have intermittent rectal discharge, chronically frequent stools 4-5 per day. The pain he is describing now sounds like crampy, gas related pains. He is going to try twice daily antispasmodic for now. He will return to see me in 6-7 weeks. If he is not improve with simple measures consider restaging his Crohn's disease per. Perhaps just starting on mesalamine-type medicine.

## 2013-03-29 NOTE — Patient Instructions (Addendum)
Trial of twice daily antispasm meds. Please return to see Dr. Ardis Hughs in 6-7 weeks, sooner if needed.

## 2013-04-12 ENCOUNTER — Telehealth: Payer: Self-pay | Admitting: *Deleted

## 2013-04-12 NOTE — Telephone Encounter (Signed)
Pt called states Losartan is causing nasal congestion.  Pt is requesting medication to be changed.  Further states nasal congestion has been going on for approx a year.  Please advise

## 2013-04-12 NOTE — Telephone Encounter (Signed)
Stop Losartan x 3-4 days: if congestion is due to Losartan - it will go away. Pls call back w/a report Thx  BP Readings from Last 3 Encounters:  03/29/13 156/96  02/23/13 148/88  08/17/12 168/108

## 2013-04-13 NOTE — Telephone Encounter (Signed)
Spoke with pt advised of MDs message 

## 2013-04-30 ENCOUNTER — Other Ambulatory Visit: Payer: Self-pay | Admitting: *Deleted

## 2013-04-30 NOTE — Telephone Encounter (Signed)
Pt states he held his Losartan x 3 days. His nasal congestion did subside. He has since re started Losartan and would like it changed to something that will lessen his congestion. Please advise. Pt is aware MD is out of office until 05/03/13.

## 2013-05-02 NOTE — Telephone Encounter (Signed)
Erline Levine, please, inform patient that he can stop Losartan. Cont Amlodipine. Add Toprol ER. RTC 2 mo Thx

## 2013-05-03 MED ORDER — AMLODIPINE BESYLATE 5 MG PO TABS: 5.0000 mg | ORAL_TABLET | Freq: Every day | ORAL | Status: DC

## 2013-05-03 MED ORDER — METOPROLOL SUCCINATE ER 25 MG PO TB24: 25.0000 mg | ORAL_TABLET | Freq: Every day | ORAL | Status: DC

## 2013-05-03 NOTE — Telephone Encounter (Signed)
Pt informed

## 2013-05-26 ENCOUNTER — Ambulatory Visit (INDEPENDENT_AMBULATORY_CARE_PROVIDER_SITE_OTHER): Payer: 59 | Admitting: Internal Medicine

## 2013-05-26 ENCOUNTER — Encounter: Payer: Self-pay | Admitting: Internal Medicine

## 2013-05-26 VITALS — BP 150/100 | HR 76 | Temp 98.6°F | Resp 16 | Wt 279.0 lb

## 2013-05-26 DIAGNOSIS — J329 Chronic sinusitis, unspecified: Secondary | ICD-10-CM

## 2013-05-26 DIAGNOSIS — E291 Testicular hypofunction: Secondary | ICD-10-CM

## 2013-05-26 DIAGNOSIS — I10 Essential (primary) hypertension: Secondary | ICD-10-CM

## 2013-05-26 MED ORDER — AMOXICILLIN-POT CLAVULANATE 875-125 MG PO TABS: 1.0000 | ORAL_TABLET | Freq: Two times a day (BID) | ORAL | Status: DC

## 2013-05-26 MED ORDER — LORATADINE 10 MG PO TABS: 10.0000 mg | ORAL_TABLET | Freq: Every day | ORAL | Status: DC

## 2013-05-26 NOTE — Assessment & Plan Note (Signed)
Loose wt

## 2013-05-26 NOTE — Assessment & Plan Note (Signed)
See diet

## 2013-05-26 NOTE — Assessment & Plan Note (Signed)
Augmentin x 10 d

## 2013-05-26 NOTE — Patient Instructions (Addendum)
There are natural ways to boost your testosterone:  1. Lose Weight If you're overweight, shedding the excess pounds may increase your testosterone levels, according to multiple research. Overweight men are more likely to have low testosterone levels to begin with, so this is an important trick to increase your body's testosterone production when you need it most.   2. Strength Training    Strength training is also known to boost testosterone levels, provided you are doing so intensely enough. When strength training to boost testosterone, you'll want to increase the weight and lower your number of reps, and then focus on exercises that work a large number of muscles.  3. Optimize Your Vitamin D Levels Vitamin D, a steroid hormone, is essential for the healthy development of the nucleus of the sperm cell, and helps maintain semen quality and sperm count. Vitamin D also increases levels of testosterone, which may boost libido. In one study, overweight men who were given vitamin D supplements had a significant increase in testosterone levels after one year.  4. Reduce Stress When you're under a lot of stress, your body releases high levels of the stress hormone cortisol. This hormone actually blocks the effects of testosterone, presumably because, from a biological standpoint, testosterone-associated behaviors (mating, competing, aggression) may have lowered your chances of survival in an emergency (hence, the "fight or flight" response is dominant, courtesy of cortisol).  5. Limit or Eliminate Sugar from Your Diet Testosterone levels decrease after you eat sugar, which is likely because the sugar leads to a high insulin level, another factor leading to low testosterone.  6. Eat Healthy Fats By healthy, this means not only mon- and polyunsaturated fats, like that found in avocadoes and nuts, but also saturated, as these are essential for building testosterone. Research shows that a diet with less than  40 percent of energy as fat (and that mainly from animal sources, i.e. saturated) lead to a decrease in testosterone levels.  It's important to understand that your body requires saturated fats from animal and vegetable sources (such as meat, dairy, certain oils, and tropical plants like coconut) for optimal functioning, and if you neglect this important food group in favor of sugar, grains and other starchy carbs, your health and weight are almost guaranteed to suffer. Examples of healthy fats you can eat more of to give your testosterone levels a boost include:  Olives and Olive oil  Coconuts and coconut oil Butter made from organic milk  Raw nuts, such as, almonds or pecans Eggs Avocados   Meats Palm oil Unheated organic nut oils   7. "Testosterone boosters" containing Vitamin D-3, Niacin, Vitamin B-6, Vitamin B-12, Magnesium, Zinc, Selenium, D-Aspartic Acid, Fenugreed Seed Extract, Oystershell, [place] Extract, [place] Ginseng may be helpful as well.    Gluten free trial (no wheat products) for 4-6 weeks. OK to use gluten-free bread and gluten-free pasta.

## 2013-05-26 NOTE — Progress Notes (Signed)
Pre visit review using our clinic review tool, if applicable. No additional management support is needed unless otherwise documented below in the visit note. 

## 2013-05-31 ENCOUNTER — Telehealth: Payer: Self-pay | Admitting: Internal Medicine

## 2013-05-31 NOTE — Telephone Encounter (Signed)
Relevant patient education mailed to patient.  

## 2013-08-23 ENCOUNTER — Ambulatory Visit: Payer: 59 | Admitting: Internal Medicine

## 2013-08-23 DIAGNOSIS — Z0289 Encounter for other administrative examinations: Secondary | ICD-10-CM

## 2014-01-18 ENCOUNTER — Encounter: Payer: 59 | Admitting: Family Medicine

## 2014-01-18 NOTE — Progress Notes (Signed)
Error   This encounter was created in error - please disregard. 

## 2014-01-24 ENCOUNTER — Ambulatory Visit: Payer: 59 | Admitting: Internal Medicine

## 2014-01-31 ENCOUNTER — Ambulatory Visit: Payer: 59 | Admitting: Internal Medicine

## 2014-01-31 DIAGNOSIS — Z0289 Encounter for other administrative examinations: Secondary | ICD-10-CM

## 2014-04-11 DIAGNOSIS — J341 Cyst and mucocele of nose and nasal sinus: Secondary | ICD-10-CM | POA: Insufficient documentation

## 2014-04-11 DIAGNOSIS — T485X5A Adverse effect of other anti-common-cold drugs, initial encounter: Secondary | ICD-10-CM | POA: Insufficient documentation

## 2014-04-11 DIAGNOSIS — J310 Chronic rhinitis: Secondary | ICD-10-CM | POA: Insufficient documentation

## 2014-04-11 DIAGNOSIS — J3489 Other specified disorders of nose and nasal sinuses: Secondary | ICD-10-CM | POA: Insufficient documentation

## 2014-05-04 ENCOUNTER — Other Ambulatory Visit: Payer: Self-pay

## 2014-05-04 MED ORDER — AMLODIPINE BESYLATE 5 MG PO TABS: 5.0000 mg | ORAL_TABLET | Freq: Every day | ORAL | Status: DC

## 2014-05-10 ENCOUNTER — Telehealth: Payer: Self-pay | Admitting: Internal Medicine

## 2014-05-10 MED ORDER — METOPROLOL SUCCINATE ER 25 MG PO TB24: 25.0000 mg | ORAL_TABLET | Freq: Every day | ORAL | Status: DC

## 2014-05-10 NOTE — Telephone Encounter (Signed)
Needs metoprolol sent to [facility] on [address].

## 2014-05-10 NOTE — Telephone Encounter (Signed)
Notified pt will send in 30 day until cpx 05/30/14,,,.lmb

## 2014-05-30 ENCOUNTER — Encounter: Payer: Self-pay | Admitting: Internal Medicine

## 2014-06-03 ENCOUNTER — Telehealth: Payer: Self-pay | Admitting: Internal Medicine

## 2014-06-03 NOTE — Telephone Encounter (Signed)
Patient no showed for CPE on 2/1.  Please advise.

## 2014-06-08 ENCOUNTER — Other Ambulatory Visit: Payer: Self-pay | Admitting: *Deleted

## 2014-06-08 MED ORDER — METOPROLOL SUCCINATE ER 25 MG PO TB24: 25.0000 mg | ORAL_TABLET | Freq: Every day | ORAL | Status: DC

## 2014-06-08 NOTE — Telephone Encounter (Signed)
Left msg on triage stating he made appt for 06/13/14 for cpx, but he is out of his metoprolol. Wanting refill sent to [facility]. Called pt inform him will send 30 day supply until he see md.../lmb

## 2014-06-13 ENCOUNTER — Encounter: Payer: Self-pay | Admitting: Internal Medicine

## 2014-06-20 ENCOUNTER — Encounter: Payer: Self-pay | Admitting: Internal Medicine

## 2014-06-20 ENCOUNTER — Ambulatory Visit (INDEPENDENT_AMBULATORY_CARE_PROVIDER_SITE_OTHER): Payer: 59 | Admitting: Internal Medicine

## 2014-06-20 ENCOUNTER — Other Ambulatory Visit (INDEPENDENT_AMBULATORY_CARE_PROVIDER_SITE_OTHER): Payer: 59

## 2014-06-20 VITALS — BP 140/80 | Temp 98.3°F | Resp 75 | Ht 75.0 in | Wt 279.0 lb

## 2014-06-20 DIAGNOSIS — J324 Chronic pansinusitis: Secondary | ICD-10-CM

## 2014-06-20 DIAGNOSIS — Z0000 Encounter for general adult medical examination without abnormal findings: Secondary | ICD-10-CM

## 2014-06-20 DIAGNOSIS — R739 Hyperglycemia, unspecified: Secondary | ICD-10-CM

## 2014-06-20 DIAGNOSIS — I10 Essential (primary) hypertension: Secondary | ICD-10-CM

## 2014-06-20 LAB — URINALYSIS
BILIRUBIN URINE: NEGATIVE
KETONES UR: NEGATIVE
LEUKOCYTES UA: NEGATIVE
NITRITE: NEGATIVE
SPECIFIC GRAVITY, URINE: 1.025 (ref 1.000–1.030)
TOTAL PROTEIN, URINE-UPE24: NEGATIVE
URINE GLUCOSE: NEGATIVE
Urobilinogen, UA: 0.2 (ref 0.0–1.0)
pH: 6.5 (ref 5.0–8.0)

## 2014-06-20 LAB — HEPATIC FUNCTION PANEL
ALBUMIN: 3.8 g/dL (ref 3.5–5.2)
ALT: 21 U/L (ref 0–53)
AST: 16 U/L (ref 0–37)
Alkaline Phosphatase: 87 U/L (ref 39–117)
BILIRUBIN DIRECT: 0.1 mg/dL (ref 0.0–0.3)
BILIRUBIN TOTAL: 0.4 mg/dL (ref 0.2–1.2)
TOTAL PROTEIN: 7 g/dL (ref 6.0–8.3)

## 2014-06-20 LAB — LIPID PANEL
CHOL/HDL RATIO: 6
Cholesterol: 223 mg/dL — ABNORMAL HIGH (ref 0–200)
HDL: 37.8 mg/dL — ABNORMAL LOW (ref >39.00)
LDL CALC: 162 mg/dL — AB (ref 0–99)
NonHDL: 185.2
Triglycerides: 115 mg/dL (ref 0.0–149.0)
VLDL: 23 mg/dL (ref 0.0–40.0)

## 2014-06-20 LAB — TESTOSTERONE: TESTOSTERONE: 250.76 ng/dL — AB (ref 300.00–890.00)

## 2014-06-20 LAB — BASIC METABOLIC PANEL
BUN: 13 mg/dL (ref 6–23)
CHLORIDE: 107 meq/L (ref 96–112)
CO2: 26 mEq/L (ref 19–32)
Calcium: 9.1 mg/dL (ref 8.4–10.5)
Creatinine, Ser: 0.96 mg/dL (ref 0.40–1.50)
GFR: 110.97 mL/min (ref >60.00)
Glucose, Bld: 85 mg/dL (ref 70–99)
POTASSIUM: 4.4 meq/L (ref 3.5–5.1)
Sodium: 140 mEq/L (ref 135–145)

## 2014-06-20 LAB — CBC WITH DIFFERENTIAL/PLATELET
BASOS ABS: 0.1 10*3/uL (ref 0.0–0.1)
Basophils Relative: 1 % (ref 0.0–3.0)
EOS PCT: 2.8 % (ref 0.0–5.0)
Eosinophils Absolute: 0.2 10*3/uL (ref 0.0–0.7)
HCT: 42.9 % (ref 39.0–52.0)
HEMOGLOBIN: 14.5 g/dL (ref 13.0–17.0)
LYMPHS ABS: 1.8 10*3/uL (ref 0.7–4.0)
LYMPHS PCT: 32.8 % (ref 12.0–46.0)
MCHC: 33.8 g/dL (ref 30.0–36.0)
MCV: 81.5 fl (ref 78.0–100.0)
MONO ABS: 0.5 10*3/uL (ref 0.1–1.0)
Monocytes Relative: 9.4 % (ref 3.0–12.0)
NEUTROS ABS: 2.9 10*3/uL (ref 1.4–7.7)
Neutrophils Relative %: 54 % (ref 43.0–77.0)
Platelets: 359 10*3/uL (ref 150.0–400.0)
RBC: 5.26 Mil/uL (ref 4.22–5.81)
RDW: 13.3 % (ref 11.5–15.5)
WBC: 5.4 10*3/uL (ref 4.0–10.5)

## 2014-06-20 LAB — TSH: TSH: 1.24 u[IU]/mL (ref 0.35–4.50)

## 2014-06-20 LAB — PSA: PSA: 0.23 ng/mL (ref 0.10–4.00)

## 2014-06-20 LAB — HEMOGLOBIN A1C: Hgb A1c MFr Bld: 5.9 % (ref 4.6–6.5)

## 2014-06-20 MED ORDER — AMLODIPINE BESYLATE 5 MG PO TABS: 5.0000 mg | ORAL_TABLET | Freq: Every day | ORAL | Status: DC

## 2014-06-20 MED ORDER — VITAMIN D 1000 UNITS PO TABS: 1000.0000 [IU] | ORAL_TABLET | Freq: Every day | ORAL | Status: AC

## 2014-06-20 MED ORDER — METOPROLOL SUCCINATE ER 25 MG PO TB24: 25.0000 mg | ORAL_TABLET | Freq: Every day | ORAL | Status: DC

## 2014-06-20 NOTE — Assessment & Plan Note (Addendum)
We discussed age appropriate health related issues, including available/recomended screening tests and vaccinations. We discussed a need for adhering to healthy diet and exercise. Labs/EKG were reviewed/ordered. All questions were answered. Refused vaccines Labs

## 2014-06-20 NOTE — Assessment & Plan Note (Signed)
A1c Wt loss

## 2014-06-20 NOTE — Patient Instructions (Signed)
Try a gluten free diet for 1 month

## 2014-06-20 NOTE — Assessment & Plan Note (Signed)
Re-start Rx

## 2014-06-20 NOTE — Progress Notes (Signed)
Subjective:   The patient is here for a wellness exam. The patient has been doing well overall without major physical or psychological issues going on lately.  HPI 39 year old male with a past medical history significant for chronic sinus congestion, hypertension, obstructive sleep apnea presents to the office with chief complaint of chest pain. Patient states that he has had sharp intermittent chest pain on the left side worse with breathing for the past 2 days. He denies any associated nausea vomiting or diaphoresis. He did not have any shortness of breath. Patient states that he is also began working out with his son this week including bench pressing. Risk factors for acute coronary syndrome include obesity and hypertension. Denies diabetes, smoking, family history of stroke or heart attack.  Patient has no other complaints  The patient presents for a follow-up of  chronic ED and hypogonadism, HTN Less fatigue and - lost wt  Wt Readings from Last 3 Encounters:  06/20/14 279 lb (126.554 kg)  05/26/13 279 lb (126.554 kg)  03/29/13 268 lb (121.564 kg)    BP Readings from Last 3 Encounters:  06/20/14 144/98  05/26/13 150/100  03/29/13 156/96        Review of Systems  Constitutional: Positive for fatigue. Negative for appetite change and unexpected weight change.  HENT: Negative for congestion, nosebleeds, sneezing and trouble swallowing.   Eyes: Negative for itching and visual disturbance.  Cardiovascular: Negative for leg swelling.  Gastrointestinal: Negative for blood in stool and abdominal distention.  Genitourinary: Negative for hematuria.  Musculoskeletal: Negative for joint swelling, gait problem and neck pain.  Neurological: Negative for tremors and speech difficulty.  Psychiatric/Behavioral: Negative for sleep disturbance, dysphoric mood and agitation. The patient is not nervous/anxious.        Objective:   Physical Exam  Constitutional: He is oriented to person,  place, and time. He appears well-developed.  HENT:  Mouth/Throat: Oropharynx is clear and moist.  Eyes: Conjunctivae are normal. Pupils are equal, round, and reactive to light.  Neck: Normal range of motion. No JVD present. No thyromegaly present.  Cardiovascular: Normal rate, regular rhythm, normal heart sounds and intact distal pulses.  Exam reveals no gallop and no friction rub.   No murmur heard. Pulmonary/Chest: Effort normal and breath sounds normal. No respiratory distress. He has no wheezes. He has no rales. He exhibits no tenderness.  Abdominal: Soft. Bowel sounds are normal. He exhibits no distension and no mass. There is no tenderness. There is no rebound and no guarding.  Musculoskeletal: Normal range of motion. He exhibits no edema or tenderness.  Lymphadenopathy:    He has no cervical adenopathy.  Neurological: He is alert and oriented to person, place, and time. He has normal reflexes. No cranial nerve deficit. He exhibits normal muscle tone. Coordination normal.  Skin: Skin is warm and dry. No rash noted.  Psychiatric: He has a normal mood and affect. His behavior is normal. Judgment and thought content normal.  Chest wall NT LS NT B testes WNL, no mass. NT No hernias   Labs reviewed Lab Results  Component Value Date   WBC 7.9 02/23/2013   HGB 15.4 02/23/2013   HCT 45.8 02/23/2013   PLT 437.0* 02/23/2013   GLUCOSE 109* 02/23/2013   CHOL 244* 12/08/2007   TRIG 90 12/08/2007   HDL 31.8* 12/08/2007   LDLDIRECT 196.0 12/08/2007   ALT 19 05/17/2012   AST 19 05/17/2012   NA 138 02/23/2013   K 4.1 02/23/2013   CL  105 02/23/2013   CREATININE 1.0 02/23/2013   BUN 12 02/23/2013   CO2 25 02/23/2013   TSH 1.59 10/10/2010   INR 0.9 10/10/2010       Assessment & Plan:

## 2014-06-20 NOTE — Progress Notes (Signed)
Pre visit review using our clinic review tool, if applicable. No additional management support is needed unless otherwise documented below in the visit note. 

## 2014-06-20 NOTE — Assessment & Plan Note (Signed)
Pt is seeing ENT at [facility]

## 2014-06-24 ENCOUNTER — Other Ambulatory Visit: Payer: Self-pay | Admitting: *Deleted

## 2014-06-24 DIAGNOSIS — E291 Testicular hypofunction: Secondary | ICD-10-CM

## 2014-08-09 ENCOUNTER — Other Ambulatory Visit: Payer: Self-pay | Admitting: *Deleted

## 2014-08-09 NOTE — Telephone Encounter (Signed)
[place]. Pls give a savings card. Thx

## 2014-08-09 NOTE — Telephone Encounter (Signed)
Pt left vm stating he used Testosterone injections in past. He is requesting transdermal form now. Please advise.

## 2014-08-10 MED ORDER — TESTOSTERONE 20.25 MG/ACT (1.62%) TD GEL: 2.0000 | TRANSDERMAL | Status: DC

## 2014-08-10 NOTE — Telephone Encounter (Signed)
Rx phoned in. Savings card upfront for p/u. Pt informed

## 2014-08-12 ENCOUNTER — Telehealth: Payer: Self-pay

## 2014-08-12 MED ORDER — TESTOSTERONE 50 MG/5GM (1%) TD GEL: 10.0000 g | Freq: Every day | TRANSDERMAL | Status: DC

## 2014-08-12 NOTE — Telephone Encounter (Signed)
Ok testim thx

## 2014-08-12 NOTE — Telephone Encounter (Signed)
Received pharmacy rejection stating that insurance will not cover Androgel pump without a prior authorization. Called [facility], spoke with Peter Congo , provided clinical information and advised that medication is a plan exclusion (not covered) and no PA can be done. Per insurance they will cover Testim without a PA.

## 2014-08-15 NOTE — Telephone Encounter (Signed)
Please update pended Testim sig and quantity.

## 2014-08-16 ENCOUNTER — Telehealth: Payer: Self-pay | Admitting: Internal Medicine

## 2014-08-16 MED ORDER — TESTOSTERONE 50 MG/5GM (1%) TD GEL: 5.0000 g | Freq: Every day | TRANSDERMAL | Status: DC

## 2014-08-16 NOTE — Telephone Encounter (Signed)
See phone note dated 08/12/14.

## 2014-08-16 NOTE — Telephone Encounter (Signed)
Patient would like to know status on PA for testosterone.  Patient does not know name of med.

## 2014-08-17 NOTE — Telephone Encounter (Signed)
Pt informed

## 2014-08-17 NOTE — Telephone Encounter (Signed)
New Rx for Testim phoned in.

## 2014-08-30 ENCOUNTER — Encounter: Payer: Self-pay | Admitting: Family

## 2014-08-30 ENCOUNTER — Other Ambulatory Visit (INDEPENDENT_AMBULATORY_CARE_PROVIDER_SITE_OTHER): Payer: 59

## 2014-08-30 ENCOUNTER — Ambulatory Visit (INDEPENDENT_AMBULATORY_CARE_PROVIDER_SITE_OTHER): Payer: 59 | Admitting: Family

## 2014-08-30 VITALS — Ht 75.0 in | Wt 280.8 lb

## 2014-08-30 DIAGNOSIS — R319 Hematuria, unspecified: Secondary | ICD-10-CM

## 2014-08-30 DIAGNOSIS — E291 Testicular hypofunction: Secondary | ICD-10-CM

## 2014-08-30 LAB — POCT URINALYSIS DIPSTICK
BILIRUBIN UA: NEGATIVE
GLUCOSE UA: NEGATIVE
Leukocytes, UA: NEGATIVE
Nitrite, UA: NEGATIVE
PH UA: 6
Spec Grav, UA: >=1.03
Urobilinogen, UA: NEGATIVE

## 2014-08-30 LAB — CBC
HCT: 43 % (ref 39.0–52.0)
Hemoglobin: 14.5 g/dL (ref 13.0–17.0)
MCHC: 33.7 g/dL (ref 30.0–36.0)
MCV: 81.9 fl (ref 78.0–100.0)
Platelets: 371 10*3/uL (ref 150.0–400.0)
RBC: 5.25 Mil/uL (ref 4.22–5.81)
RDW: 12.6 % (ref 11.5–15.5)
WBC: 6.8 10*3/uL (ref 4.0–10.5)

## 2014-08-30 LAB — BASIC METABOLIC PANEL
BUN: 12 mg/dL (ref 6–23)
CALCIUM: 9.6 mg/dL (ref 8.4–10.5)
CO2: 33 meq/L — AB (ref 19–32)
CREATININE: 1.17 mg/dL (ref 0.40–1.50)
Chloride: 104 mEq/L (ref 96–112)
GFR: 88.24 mL/min (ref >60.00)
GLUCOSE: 91 mg/dL (ref 70–99)
Potassium: 4.4 mEq/L (ref 3.5–5.1)
Sodium: 141 mEq/L (ref 135–145)

## 2014-08-30 LAB — PSA: PSA: 0.28 ng/mL (ref 0.10–4.00)

## 2014-08-30 NOTE — Assessment & Plan Note (Signed)
Hematuria of undetermined source. In office urinalysis reveals no evidence of infection with negative nitrites and negative leukocytes. Is positive for hematuria and protein. Obtain renal ultrasound to rule out kidney stone or other pathology. Obtain CBC, basic metabolic panel, and PSA. Recheck urinalysis and 3 days to determine presence of continued hematuria or if this is transient. Follow-up if symptoms worsen or fail to improve prior to then.

## 2014-08-30 NOTE — Progress Notes (Signed)
   Subjective:    Patient ID: Casey Kelly, male    DOB: 07-19-1975, 39 y.o.   MRN: [id number]  Chief Complaint  Patient presents with  . Hematuria    x2 days, noticed his urine has had a reddish tent to it, no other sxs associated    HPI:  Casey Kelly is a 39 y.o. male who presents today for an acute office visit.   Associated symptom of blood in his urine has been going on for about 2 days. Notes that there is small reddish tint to the color of his urine which has lessened in color since the onset 2 days ago. Notes that he had a sinus infection recently which has resolved on its own. Denies dysuria, frequency or urgency. Notes some increase in nocturia.  Does have a history of kidney stones in the past when he was younger.   Allergies  Allergen Reactions  . Losartan     Nasal congestion    Current Outpatient Prescriptions on File Prior to Visit  Medication Sig Dispense Refill  . amLODipine (NORVASC) 5 MG tablet Take 1 tablet (5 mg total) by mouth daily. 30 tablet 11  . cholecalciferol (VITAMIN D) 1000 UNITS tablet Take 1 tablet (1,000 Units total) by mouth daily. 100 tablet 3  . hyoscyamine (LEVBID) 0.375 MG 12 hr tablet Take 1 tablet (0.375 mg total) by mouth 2 (two) times daily. 60 tablet 11  . ibuprofen (ADVIL,MOTRIN) 600 MG tablet Take 1 tablet (600 mg total) by mouth every 8 (eight) hours as needed for pain. 60 tablet 0  . metoprolol succinate (TOPROL-XL) 25 MG 24 hr tablet Take 1 tablet (25 mg total) by mouth daily. 30 tablet 11  . testosterone (TESTIM) 50 MG/5GM (1%) GEL Place 5 g onto the skin daily. 300 g 5   No current facility-administered medications on file prior to visit.    Review of Systems  Constitutional: Negative for fever and chills.  Genitourinary: Positive for hematuria. Negative for dysuria, urgency, flank pain, discharge, penile swelling, difficulty urinating and penile pain.      Objective:    Ht 6' 3"  (1.905 m)  Wt 280 lb 12.8 oz (127.37 kg)   BMI 35.10 kg/m2 Nursing note and vital signs reviewed.  Physical Exam  Constitutional: He is oriented to person, place, and time. He appears well-developed and well-nourished. No distress.  Cardiovascular: Normal rate, regular rhythm, normal heart sounds and intact distal pulses.   Pulmonary/Chest: Effort normal and breath sounds normal.  Genitourinary: No discharge found.  Neurological: He is alert and oriented to person, place, and time.  Skin: Skin is warm and dry.  Psychiatric: He has a normal mood and affect. His behavior is normal. Judgment and thought content normal.       Assessment & Plan:

## 2014-08-30 NOTE — Progress Notes (Signed)
Pre visit review using our clinic review tool, if applicable. No additional management support is needed unless otherwise documented below in the visit note. 

## 2014-08-30 NOTE — Patient Instructions (Signed)
Thank you for choosing [facility].  Summary/Instructions:  Please stop by the lab on the basement level of the building for your blood work. Your results will be released to [place] (or called to you) after review, usually within 72 hours after test completion. If any changes need to be made, you will be notified at that same time.  Referrals have been made during this visit. You should expect to hear back from our schedulers in about 7-10 days in regards to establishing an appointment with the specialists we discussed.   If your symptoms worsen or fail to improve, please contact our office for further instruction, or in case of emergency go directly to the emergency room at the closest medical facility.   Hematuria Hematuria is blood in your urine. It can be caused by a bladder infection, kidney infection, prostate infection, kidney stone, or cancer of your urinary tract. Infections can usually be treated with medicine, and a kidney stone usually will pass through your urine. If neither of these is the cause of your hematuria, further workup to find out the reason may be needed. It is very important that you tell your health care provider about any blood you see in your urine, even if the blood stops without treatment or happens without causing pain. Blood in your urine that happens and then stops and then happens again can be a symptom of a very serious condition. Also, pain is not a symptom in the initial stages of many urinary cancers. HOME CARE INSTRUCTIONS   Drink lots of fluid, 3-4 quarts a day. If you have been diagnosed with an infection, cranberry juice is especially recommended, in addition to large amounts of water.  Avoid caffeine, tea, and carbonated beverages because they tend to irritate the bladder.  Avoid alcohol because it may irritate the prostate.  Take all medicines as directed by your health care provider.  If you were prescribed an antibiotic medicine, finish it  all even if you start to feel better.  If you have been diagnosed with a kidney stone, follow your health care provider's instructions regarding straining your urine to catch the stone.  Empty your bladder often. Avoid holding urine for long periods of time.  After a bowel movement, women should cleanse front to back. Use each tissue only once.  Empty your bladder before and after sexual intercourse if you are a male. SEEK MEDICAL CARE IF:  You develop back pain.  You have a fever.  You have a feeling of sickness in your stomach (nausea) or vomiting.  Your symptoms are not better in 3 days. Return sooner if you are getting worse. SEEK IMMEDIATE MEDICAL CARE IF:   You develop severe vomiting and are unable to keep the medicine down.  You develop severe back or abdominal pain despite taking your medicines.  You begin passing a large amount of blood or clots in your urine.  You feel extremely weak or faint, or you pass out. MAKE SURE YOU:   Understand these instructions.  Will watch your condition.  Will get help right away if you are not doing well or get worse. Document Released: 04/15/2005 Document Revised: 08/30/2013 Document Reviewed: 12/14/2012 [facility] Patient Information 2015 [place], [place]. This information is not intended to replace advice given to you by your health care provider. Make sure you discuss any questions you have with your health care provider.

## 2014-08-31 LAB — TESTOSTERONE, FREE, TOTAL, SHBG
Sex Hormone Binding: 38 nmol/L (ref 10–50)
Testosterone, Free: 55.1 pg/mL (ref 47.0–244.0)
Testosterone-% Free: 1.8 % (ref 1.6–2.9)
Testosterone: 305 ng/dL (ref 300–890)

## 2014-09-01 ENCOUNTER — Telehealth: Payer: Self-pay | Admitting: Family

## 2014-09-01 LAB — URINE CULTURE
COLONY COUNT: NO GROWTH
Organism ID, Bacteria: NO GROWTH

## 2014-09-01 NOTE — Telephone Encounter (Signed)
Called pt to let him know. He wanted to know his testosterone level so I told him it was at 305. He asked if he should still be using the testosterone rx. Please advise

## 2014-09-01 NOTE — Telephone Encounter (Signed)
If the lab was completed and he has not used the testosterone recently, then we can consider stopping it. If he continues to use the testosterone then this is what is keep his testosterone normal.

## 2014-09-01 NOTE — Telephone Encounter (Signed)
Pt aware.

## 2014-09-01 NOTE — Telephone Encounter (Signed)
Please inform the patient that his urine culture showed no growth indicating no infection. His other labs including his kidney function, electrolytes, prostate, and white/red blood cells are all within normal limits.

## 2014-09-05 ENCOUNTER — Other Ambulatory Visit: Payer: 59

## 2014-09-06 ENCOUNTER — Ambulatory Visit
Admission: RE | Admit: 2014-09-06 | Discharge: 2014-09-06 | Disposition: A | Discharge status: Home / Self Care | Payer: 59 | Source: Ambulatory Visit | Attending: Family | Admitting: Family

## 2014-09-06 DIAGNOSIS — R319 Hematuria, unspecified: Secondary | ICD-10-CM

## 2014-09-07 ENCOUNTER — Telehealth: Payer: Self-pay | Admitting: Family

## 2014-09-07 NOTE — Telephone Encounter (Signed)
Please inform the patient that his renal ultrasound was normal with no evidence of kidney stones.

## 2014-09-07 NOTE — Telephone Encounter (Signed)
Pt aware of results 

## 2014-09-17 DIAGNOSIS — J343 Hypertrophy of nasal turbinates: Secondary | ICD-10-CM | POA: Insufficient documentation

## 2014-09-17 DIAGNOSIS — J320 Chronic maxillary sinusitis: Secondary | ICD-10-CM | POA: Insufficient documentation

## 2014-12-19 ENCOUNTER — Encounter: Payer: Self-pay | Admitting: Internal Medicine

## 2014-12-19 ENCOUNTER — Ambulatory Visit (INDEPENDENT_AMBULATORY_CARE_PROVIDER_SITE_OTHER): Payer: 59 | Admitting: Internal Medicine

## 2014-12-19 VITALS — BP 142/82 | HR 74 | Wt 282.0 lb

## 2014-12-19 DIAGNOSIS — I10 Essential (primary) hypertension: Secondary | ICD-10-CM

## 2014-12-19 DIAGNOSIS — R197 Diarrhea, unspecified: Secondary | ICD-10-CM | POA: Diagnosis not present

## 2014-12-19 DIAGNOSIS — E291 Testicular hypofunction: Secondary | ICD-10-CM

## 2014-12-19 MED ORDER — DIPHENOXYLATE-ATROPINE 2.5-0.025 MG PO TABS: 1.0000 | ORAL_TABLET | Freq: Four times a day (QID) | ORAL | Status: DC | PRN

## 2014-12-19 MED ORDER — TESTOSTERONE CYPIONATE 200 MG/ML IM SOLN: 400.0000 mg | INTRAMUSCULAR | Status: DC

## 2014-12-19 MED ORDER — "SYRINGE/NEEDLE (DISP) 23G X 1-1/2"" 3 ML MISC": 1.0000 [IU] | Status: DC

## 2014-12-19 MED ORDER — PODOFILOX 0.5 % EX GEL: CUTANEOUS | Status: DC

## 2014-12-19 NOTE — Progress Notes (Signed)
Subjective:  Patient ID: Casey Kelly, male    DOB: [birth year]  Age: 39 y.o. MRN: [id number]  CC: No chief complaint on file.   HPI OSBALDO MARK presents for hypogonadism - wants to go on shots. C/o diarrhea after eating out - chronic. C/o gen warts  Outpatient Prescriptions Prior to Visit  Medication Sig Dispense Refill  . amLODipine (NORVASC) 5 MG tablet Take 1 tablet (5 mg total) by mouth daily. 30 tablet 11  . cholecalciferol (VITAMIN D) 1000 UNITS tablet Take 1 tablet (1,000 Units total) by mouth daily. 100 tablet 3  . metoprolol succinate (TOPROL-XL) 25 MG 24 hr tablet Take 1 tablet (25 mg total) by mouth daily. 30 tablet 11  . ibuprofen (ADVIL,MOTRIN) 600 MG tablet Take 1 tablet (600 mg total) by mouth every 8 (eight) hours as needed for pain. (Patient not taking: Reported on 12/19/2014) 60 tablet 0  . hyoscyamine (LEVBID) 0.375 MG 12 hr tablet Take 1 tablet (0.375 mg total) by mouth 2 (two) times daily. (Patient not taking: Reported on 12/19/2014) 60 tablet 11  . testosterone (TESTIM) 50 MG/5GM (1%) GEL Place 5 g onto the skin daily. (Patient not taking: Reported on 12/19/2014) 300 g 5   No facility-administered medications prior to visit.    ROS Review of Systems  Constitutional: Negative for appetite change, fatigue and unexpected weight change.  HENT: Negative for congestion, nosebleeds, sneezing, sore throat and trouble swallowing.   Eyes: Negative for itching and visual disturbance.  Respiratory: Negative for cough.   Cardiovascular: Negative for chest pain, palpitations and leg swelling.  Gastrointestinal: Negative for nausea, diarrhea, blood in stool and abdominal distention.  Genitourinary: Negative for frequency and hematuria.  Musculoskeletal: Negative for back pain, joint swelling, gait problem and neck pain.  Skin: Negative for rash.  Neurological: Negative for dizziness, tremors, speech difficulty and weakness.  Psychiatric/Behavioral: Negative for suicidal  ideas, sleep disturbance, dysphoric mood and agitation. The patient is not nervous/anxious.     Objective:  BP 142/82 mmHg  Pulse 74  Wt 282 lb (127.914 kg)  SpO2 97%  BP Readings from Last 3 Encounters:  12/19/14 142/82  06/20/14 140/80  05/26/13 150/100    Wt Readings from Last 3 Encounters:  12/19/14 282 lb (127.914 kg)  08/30/14 280 lb 12.8 oz (127.37 kg)  06/20/14 279 lb (126.554 kg)    Physical Exam  Constitutional: He is oriented to person, place, and time. He appears well-developed. No distress.  NAD  HENT:  Mouth/Throat: Oropharynx is clear and moist.  Eyes: Conjunctivae are normal. Pupils are equal, round, and reactive to light.  Neck: Normal range of motion. No JVD present. No thyromegaly present.  Cardiovascular: Normal rate, regular rhythm, normal heart sounds and intact distal pulses.  Exam reveals no gallop and no friction rub.   No murmur heard. Pulmonary/Chest: Effort normal and breath sounds normal. No respiratory distress. He has no wheezes. He has no rales. He exhibits no tenderness.  Abdominal: Soft. Bowel sounds are normal. He exhibits no distension and no mass. There is no tenderness. There is no rebound and no guarding.  Musculoskeletal: Normal range of motion. He exhibits no edema or tenderness.  Lymphadenopathy:    He has no cervical adenopathy.  Neurological: He is alert and oriented to person, place, and time. He has normal reflexes. No cranial nerve deficit. He exhibits normal muscle tone. He displays a negative Romberg sign. Coordination and gait normal.  Skin: Skin is warm and dry. Rash noted.  Psychiatric: He has a normal mood and affect. His behavior is normal. Judgment and thought content normal.  gen warts Trace edema B LEs  Lab Results  Component Value Date   WBC 6.8 08/30/2014   HGB 14.5 08/30/2014   HCT 43.0 08/30/2014   PLT 371.0 08/30/2014   GLUCOSE 91 08/30/2014   CHOL 223* 06/20/2014   TRIG 115.0 06/20/2014   HDL 37.80*  06/20/2014   LDLDIRECT 196.0 12/08/2007   LDLCALC 162* 06/20/2014   ALT 21 06/20/2014   AST 16 06/20/2014   NA 141 08/30/2014   K 4.4 08/30/2014   CL 104 08/30/2014   CREATININE 1.17 08/30/2014   BUN 12 08/30/2014   CO2 33* 08/30/2014   TSH 1.24 06/20/2014   PSA 0.28 08/30/2014   INR 0.9 10/10/2010   HGBA1C 5.9 06/20/2014    US Renal  09/06/2014   CLINICAL DATA:  39 year old male with hematuria.  EXAM: RENAL / URINARY TRACT ULTRASOUND COMPLETE  COMPARISON:  None.  FINDINGS: Right Kidney:  Length: 11.9 cm. Echogenicity within normal limits. No mass, definite calculi or hydronephrosis visualized.  Left Kidney:  Length: 12.3 cm. Echogenicity within normal limits. No mass, definite calculi or hydronephrosis visualized.  Bladder:  Appears normal for degree of bladder distention.  IMPRESSION: Unremarkable renal ultrasound.   Electronically Signed   By: Margarette Canada M.D.   On: 09/06/2014 15:58    Assessment & Plan:   Diagnoses and all orders for this visit:  Essential hypertension -     PSA; Future -     Hepatic function panel; Future -     Basic metabolic panel; Future -     CBC with Differential/Platelet; Future -     Testosterone; Future  Diarrhea -     PSA; Future -     Hepatic function panel; Future -     Basic metabolic panel; Future -     CBC with Differential/Platelet; Future -     Testosterone; Future  Hypogonadism in male -     PSA; Future -     Hepatic function panel; Future -     Basic metabolic panel; Future -     CBC with Differential/Platelet; Future -     Testosterone; Future  Other orders -     Discontinue: testosterone cypionate (DEPOTESTOSTERONE CYPIONATE) 200 MG/ML injection; Inject 2 mLs (400 mg total) into the muscle every 14 (fourteen) days. -     SYRINGE-NEEDLE, DISP, 3 ML (B-D 3CC LUER-LOK SYR 23GX1-1/2) 23G X 1-1/2" 3 ML MISC; 1 Units by Does not apply route every 14 (fourteen) days. fot testoterone inj IM q 14 d -     diphenoxylate-atropine  (LOMOTIL) 2.5-0.025 MG per tablet; Take 1 tablet by mouth 4 (four) times daily as needed for diarrhea or loose stools. -     testosterone cypionate (DEPOTESTOSTERONE CYPIONATE) 200 MG/ML injection; Inject 2 mLs (400 mg total) into the muscle every 14 (fourteen) days. -     podofilox (CONDYLOX) 0.5 % gel; As directed  I have discontinued Mr. Hail's hyoscyamine and testosterone. I am also having him start on diphenoxylate-atropine and podofilox. Additionally, I am having him maintain his ibuprofen, amLODipine, metoprolol succinate, cholecalciferol, SYRINGE-NEEDLE (DISP) 3 ML, and testosterone cypionate.  Meds ordered this encounter  Medications  . DISCONTD: testosterone cypionate (DEPOTESTOSTERONE CYPIONATE) 200 MG/ML injection    Sig: Inject 2 mLs (400 mg total) into the muscle every 14 (fourteen) days.    Dispense:  10 mL    Refill:  5  . SYRINGE-NEEDLE, DISP, 3 ML (B-D 3CC LUER-LOK SYR 23GX1-1/2) 23G X 1-1/2" 3 ML MISC    Sig: 1 Units by Does not apply route every 14 (fourteen) days. fot testoterone inj IM q 14 d    Dispense:  50 each    Refill:  3  . diphenoxylate-atropine (LOMOTIL) 2.5-0.025 MG per tablet    Sig: Take 1 tablet by mouth 4 (four) times daily as needed for diarrhea or loose stools.    Dispense:  60 tablet    Refill:  1  . testosterone cypionate (DEPOTESTOSTERONE CYPIONATE) 200 MG/ML injection    Sig: Inject 2 mLs (400 mg total) into the muscle every 14 (fourteen) days.    Dispense:  10 mL    Refill:  5  . podofilox (CONDYLOX) 0.5 % gel    Sig: As directed    Dispense:  3.5 g    Refill:  2     Follow-up: Return in about 3 months (around 03/21/2015).  Walker Kehr, MD

## 2014-12-19 NOTE — Assessment & Plan Note (Signed)
Lomotil prn

## 2014-12-19 NOTE — Progress Notes (Signed)
Pre visit review using our clinic review tool, if applicable. No additional management support is needed unless otherwise documented below in the visit note. 

## 2014-12-19 NOTE — Assessment & Plan Note (Signed)
BP nl at home Risks associated with treatment noncompliance were discussed. Compliance was encouraged.

## 2014-12-19 NOTE — Assessment & Plan Note (Signed)
Potential benefits of a long term IM testosterone  use as well as potential risks  and complications were explained to the patient and were aknowledged. 8/16 switching back to injections

## 2015-01-19 ENCOUNTER — Telehealth: Payer: Self-pay | Admitting: Gastroenterology

## 2015-01-19 NOTE — Telephone Encounter (Signed)
Pt has been scheduled for a follow up to discuss crohns and colon for 03/22/15. He will call if he has any concerns prior to that appt.

## 2015-03-20 ENCOUNTER — Ambulatory Visit: Payer: 59 | Admitting: Internal Medicine

## 2015-03-22 ENCOUNTER — Ambulatory Visit: Payer: 59 | Admitting: Gastroenterology

## 2015-03-23 DIAGNOSIS — Z0289 Encounter for other administrative examinations: Secondary | ICD-10-CM

## 2015-06-27 ENCOUNTER — Other Ambulatory Visit: Payer: Self-pay | Admitting: *Deleted

## 2015-06-27 MED ORDER — METOPROLOL SUCCINATE ER 25 MG PO TB24: 25.0000 mg | ORAL_TABLET | Freq: Every day | ORAL | Status: DC

## 2015-06-27 MED ORDER — AMLODIPINE BESYLATE 5 MG PO TABS: 5.0000 mg | ORAL_TABLET | Freq: Every day | ORAL | Status: DC

## 2015-06-27 NOTE — Telephone Encounter (Signed)
Refill done.  

## 2015-07-04 ENCOUNTER — Ambulatory Visit (INDEPENDENT_AMBULATORY_CARE_PROVIDER_SITE_OTHER): Payer: 59 | Admitting: Internal Medicine

## 2015-07-04 ENCOUNTER — Encounter: Payer: Self-pay | Admitting: Internal Medicine

## 2015-07-04 ENCOUNTER — Other Ambulatory Visit (INDEPENDENT_AMBULATORY_CARE_PROVIDER_SITE_OTHER): Payer: 59

## 2015-07-04 ENCOUNTER — Ambulatory Visit (INDEPENDENT_AMBULATORY_CARE_PROVIDER_SITE_OTHER)
Admission: RE | Admit: 2015-07-04 | Discharge: 2015-07-04 | Disposition: A | Discharge status: Home / Self Care | Payer: 59 | Source: Ambulatory Visit | Attending: Internal Medicine | Admitting: Internal Medicine

## 2015-07-04 ENCOUNTER — Other Ambulatory Visit: Payer: 59

## 2015-07-04 VITALS — BP 132/80 | HR 76 | Temp 98.3°F | Resp 20 | Wt 286.0 lb

## 2015-07-04 DIAGNOSIS — E291 Testicular hypofunction: Secondary | ICD-10-CM

## 2015-07-04 DIAGNOSIS — R197 Diarrhea, unspecified: Secondary | ICD-10-CM

## 2015-07-04 DIAGNOSIS — I10 Essential (primary) hypertension: Secondary | ICD-10-CM

## 2015-07-04 DIAGNOSIS — M545 Low back pain, unspecified: Secondary | ICD-10-CM | POA: Insufficient documentation

## 2015-07-04 DIAGNOSIS — R109 Unspecified abdominal pain: Secondary | ICD-10-CM | POA: Diagnosis not present

## 2015-07-04 DIAGNOSIS — R310 Gross hematuria: Secondary | ICD-10-CM | POA: Diagnosis not present

## 2015-07-04 DIAGNOSIS — N23 Unspecified renal colic: Secondary | ICD-10-CM

## 2015-07-04 LAB — BASIC METABOLIC PANEL
BUN: 16 mg/dL (ref 6–23)
CO2: 28 meq/L (ref 19–32)
Calcium: 9.4 mg/dL (ref 8.4–10.5)
Chloride: 105 mEq/L (ref 96–112)
Creatinine, Ser: 1.11 mg/dL (ref 0.40–1.50)
GFR: 93.38 mL/min (ref >60.00)
Glucose, Bld: 83 mg/dL (ref 70–99)
Potassium: 4.2 mEq/L (ref 3.5–5.1)
SODIUM: 141 meq/L (ref 135–145)

## 2015-07-04 LAB — CBC WITH DIFFERENTIAL/PLATELET
Basophils Absolute: 0.1 10*3/uL (ref 0.0–0.1)
Basophils Relative: 0.8 % (ref 0.0–3.0)
EOS PCT: 2.5 % (ref 0.0–5.0)
Eosinophils Absolute: 0.2 10*3/uL (ref 0.0–0.7)
HCT: 44 % (ref 39.0–52.0)
HEMOGLOBIN: 14.7 g/dL (ref 13.0–17.0)
Lymphocytes Relative: 34.4 % (ref 12.0–46.0)
Lymphs Abs: 2.7 10*3/uL (ref 0.7–4.0)
MCHC: 33.3 g/dL (ref 30.0–36.0)
MCV: 81.1 fl (ref 78.0–100.0)
MONO ABS: 0.7 10*3/uL (ref 0.1–1.0)
MONOS PCT: 8.5 % (ref 3.0–12.0)
Neutro Abs: 4.2 10*3/uL (ref 1.4–7.7)
Neutrophils Relative %: 53.8 % (ref 43.0–77.0)
Platelets: 390 10*3/uL (ref 150.0–400.0)
RBC: 5.42 Mil/uL (ref 4.22–5.81)
RDW: 13.9 % (ref 11.5–15.5)
WBC: 7.8 10*3/uL (ref 4.0–10.5)

## 2015-07-04 LAB — POCT URINALYSIS DIPSTICK
Glucose, UA: NEGATIVE
KETONES UA: NEGATIVE
Leukocytes, UA: NEGATIVE
NITRITE UA: NEGATIVE
Spec Grav, UA: 1.025
Urobilinogen, UA: NEGATIVE
pH, UA: 6

## 2015-07-04 MED ORDER — HYDROCODONE-ACETAMINOPHEN 5-325 MG PO TABS: 1.0000 | ORAL_TABLET | Freq: Four times a day (QID) | ORAL | Status: DC | PRN

## 2015-07-04 MED ORDER — IBUPROFEN 600 MG PO TABS: 600.0000 mg | ORAL_TABLET | Freq: Three times a day (TID) | ORAL | Status: DC | PRN

## 2015-07-04 NOTE — Addendum Note (Signed)
Addended by: Biagio Borg on: 07/04/2015 02:22 PM   Modules accepted: Orders

## 2015-07-04 NOTE — Addendum Note (Signed)
Addended by: Biagio Borg on: 07/04/2015 03:03 PM   Modules accepted: Orders

## 2015-07-04 NOTE — Assessment & Plan Note (Addendum)
High suspicion for recurrent left renal stone, UDip reviewed + blood, pain free now but will need CT to r/o obstruction, will try to order now, then also cbc, urine cx, and urology referral for same; gave pain med for prn recurrent pain

## 2015-07-04 NOTE — Progress Notes (Signed)
Pre visit review using our clinic review tool, if applicable. No additional management support is needed unless otherwise documented below in the visit note. 

## 2015-07-04 NOTE — Assessment & Plan Note (Signed)
Today likely an element of spine pain exac by overwt, for nsaid prn,  to f/u any worsening symptoms or concerns

## 2015-07-04 NOTE — Progress Notes (Signed)
Subjective:    Patient ID: Casey Kelly, male    DOB: Jun 04, 1975, 40 y.o.   MRN: [id number]  HPI  Here with c/o left sided flank pain 3 days ago, severe, sharp, constant but lasted total approx 4 hrs, noted gross hematuria x 1 during this, also with n/v /x 1, no fever, no trauma Denies urinary symptoms such as dysuria, frequency, urgency, other flank pain,, fever, chills. nohting made better or worse during episode.  Has not had obvious passing of any stone.  Has hx of Renal stone 20 yrs ago.  No hx of other hematuria.  Pt denies chest pain, increased sob or doe, wheezing, orthopnea, PND, increased LE swelling, palpitations, dizziness or syncope.   Pt denies polydipsia, polyuria.  No recent imaging.  Also, Pt continues to have recurring LBP without change in severity, bowel or bladder change, fever, wt loss,  worsening LE pain/numbness/weakness, gait change or falls. - asks for non drowsy pain med for this as well. Past Medical History  Diagnosis Date  . Bruxism   . Plantar fasciitis 2010    bilateral  . History of kidney stones   . Crohn's disease ([place])     no current med.  [name] Headache(784.0)     sinus  . Chronic sinusitis 06/2012  . Hypertension     under control with med., has been on med. x 4 yr.  . OSA (obstructive sleep apnea)     rarely uses CPAP   Past Surgical History  Procedure Laterality Date  . Ethmoidectomy Bilateral 07/17/2009    anterior endoscopic  . Nasal polyp surgery Right 07/17/2009  . Maxillary antrostomy Bilateral 07/17/2009    endoscopic  . Adenoidectomy      as a child  . Hand surgery Right     fingertip amputation  . Tympanostomy tube placement      as a child    reports that he has never smoked. He has never used smokeless tobacco. He reports that he does not drink alcohol or use illicit drugs. family history includes Allergic rhinitis in his daughter, mother, and son; Asthma in his son; Breast cancer in his mother; Cancer (age of onset: 18) in his father;  Colon polyps in his father; Lung cancer in his father; Prostate cancer in his father. Allergies  Allergen Reactions  . Losartan     Nasal congestion   Current Outpatient Prescriptions on File Prior to Visit  Medication Sig Dispense Refill  . amLODipine (NORVASC) 5 MG tablet Take 1 tablet (5 mg total) by mouth daily. 30 tablet 1  . diphenoxylate-atropine (LOMOTIL) 2.5-0.025 MG per tablet Take 1 tablet by mouth 4 (four) times daily as needed for diarrhea or loose stools. 60 tablet 1  . ibuprofen (ADVIL,MOTRIN) 600 MG tablet Take 1 tablet (600 mg total) by mouth every 8 (eight) hours as needed for pain. 60 tablet 0  . metoprolol succinate (TOPROL-XL) 25 MG 24 hr tablet Take 1 tablet (25 mg total) by mouth daily. NEEDS APPOINTMENT FOR FUTURE REFILLS. 30 tablet 1  . podofilox (CONDYLOX) 0.5 % gel As directed 3.5 g 2  . SYRINGE-NEEDLE, DISP, 3 ML (B-D 3CC LUER-LOK SYR 23GX1-1/2) 23G X 1-1/2" 3 ML MISC 1 Units by Does not apply route every 14 (fourteen) days. fot testoterone inj IM q 14 d 50 each 3   No current facility-administered medications on file prior to visit.   Review of Systems  Constitutional: Negative for unusual diaphoresis or night sweats HENT: Negative for  ringing in ear or discharge Eyes: Negative for double vision or worsening visual disturbance.  Respiratory: Negative for choking and stridor.   Gastrointestinal: Negative for vomiting or other signifcant bowel change Genitourinary: Negative for hematuria or change in urine volume.  Musculoskeletal: Negative for other MSK pain or swelling Skin: Negative for color change and worsening wound.  Neurological: Negative for tremors and numbness other than noted  Psychiatric/Behavioral: Negative for decreased concentration or agitation other than above       Objective:   Physical Exam BP 132/80 mmHg  Pulse 76  Temp(Src) 98.3 F (36.8 C) (Oral)  Resp 20  Wt 286 lb (129.729 kg)  SpO2 97% VS noted,  Constitutional: Pt appears  in no significant distress HENT: Head: NCAT.  Right Ear: External ear normal.  Left Ear: External ear normal.  Eyes: . Pupils are equal, round, and reactive to light. Conjunctivae and EOM are normal Neck: Normal range of motion. Neck supple.  Cardiovascular: Normal rate and regular rhythm.   Pulmonary/Chest: Effort normal and breath sounds without rales or wheezing.  Abd:  Soft, NT, ND, + BS, no flank tender bilat Neurological: Pt is alert. Not confused , motor grossly intact Skin: Skin is warm. No rash, no LE edema Psychiatric: Pt behavior is normal. No agitation.  Spine nontender  POCT Urinalysis Dipstick  Status: Finalresult Visible to patient:  Not Released Dx:  Kidney pain              Ref Range 1:39 PM  96moago  146yrgo  2y21yro     Color, UA  yellow dark yellow      Clarity, UA  cloudy clear      Glucose, UA  negative negative      Bilirubin, UA  1+ negative      Ketones, UA  negative small      Spec Grav, UA  1.025 >=1.030      Blood, UA  2+ small      pH, UA  6.0 6.0      Protein, UA  +- small      Urobilinogen, UA  negative negative 0.2 0.2    Nitrite, UA  negative negative      Leukocytes, UA Negative  Negative               Assessment & Plan:

## 2015-07-04 NOTE — Patient Instructions (Signed)
Please take all new medication as prescribed - the milder anti-inflammatory for the lower back pains, as well as the stronger hydrocodone if the severe pain returns  Please continue all other medications as before, and refills have been done if requested.  Please have the pharmacy call with any other refills you may need.  Please keep your appointments with your specialists as you may have planned  You will be contacted regarding the referral for: Urology, as well as the CT scan (to see PCCs now for the CT scan)  Please go to the LAB in the Basement (turn left off the elevator) for the tests to be done today  You will be contacted by phone if any changes need to be made immediately.  Otherwise, you will receive a letter about your results with an explanation, but please check with MyChart first.  Please remember to sign up for MyChart if you have not done so, as this will be important to you in the future with finding out test results, communicating by private email, and scheduling acute appointments online when needed.

## 2015-07-04 NOTE — Assessment & Plan Note (Signed)
stable overall by history and exam, recent data reviewed with pt, and pt to continue medical treatment as before,  to f/u any worsening symptoms or concerns BP Readings from Last 3 Encounters:  07/04/15 132/80  12/19/14 142/82  06/20/14 140/80

## 2015-07-06 ENCOUNTER — Telehealth: Payer: Self-pay | Admitting: Internal Medicine

## 2015-07-06 LAB — CULTURE, URINE COMPREHENSIVE
Colony Count: NO GROWTH
Organism ID, Bacteria: NO GROWTH

## 2015-07-06 NOTE — Telephone Encounter (Signed)
Pt called in and would like to know if his lab results and MRI results are in?

## 2015-07-25 ENCOUNTER — Other Ambulatory Visit: Payer: Self-pay | Admitting: Urology

## 2015-08-03 ENCOUNTER — Encounter (HOSPITAL_COMMUNITY): Payer: Self-pay | Admitting: *Deleted

## 2015-08-06 NOTE — H&P (Signed)
History of Present Illness    The patient is a 40 year old male with past medical history significant for nephrolithiasis who presents for evaluation of left flank pain and intermittent hematuria. He has a recent CT scan that shows a 6 mm slightly obstructing proximal ureteral calculus as well as a 9 mm nonobstructing right lower pole stone. He complains of intermittent left flank pain that is well-controlled. He denies any nausea vomiting, fevers or chills. His was told just on the right side 20 years ago, but he is not sure if he passed it. 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 He has tried Condylox in the past which treated however returned approximately 2 weeks later. He is more of this medication at home.    On follow-up, the patient reports that he still has intermittent flank pain. He has not passed stones. He has used to Condylox cream with resolution of his condyloma.   Past Medical History Problems  1. History of hypertension (Z86.79)  Surgical History Problems  1. History of Hand Surgery 2. History of Nose Surgery  Current Meds 1. AmLODIPine Besylate TABS;  Therapy: (Recorded:13Mar2017) to Recorded 2. Metoprolol Tartrate TABS;  Therapy: (Recorded:13Mar2017) to Recorded  Allergies Medication  1. No Known Drug Allergies  Family History Problems  1. Family history of hypertension (Z82.49) 2. Family history of lung cancer (Z80.1) : Father  Social History Problems  1. Denied: History of Alcohol use 2. Denied: History of Caffeine use 3. Father deceased   54 lung cancer 4. Married 5. Mother alive and healthy   34 6. Never a smoker 7. Number of children   2 sons/ 1 daughter 8. Occupation   Art gallery manager  Review of Systems Genitourinary, constitutional, skin, eye, otolaryngeal, hematologic/lymphatic, cardiovascular, pulmonary, endocrine, musculoskeletal, gastrointestinal, neurological and psychiatric system(s) were reviewed and  pertinent findings if present are noted and are otherwise negative.  Genitourinary: hematuria and erectile dysfunction.  Musculoskeletal: back pain.    Vitals Vital Signs  Blood Pressure: 145 / 91 Temperature: 97.9 F Heart Rate: 84  Physical Exam Constitutional: Well nourished . No acute distress.   ENT:. The ears and nose are normal in appearance.   Neck: The appearance of the neck is normal.   Pulmonary: No respiratory distress.   Cardiovascular:. No peripheral edema.   Abdomen: The abdomen is soft and nontender.   Skin: Normal skin turgor and no visible rash.   Neuro/Psych:. Mood and affect are appropriate. No focal sensory deficits.    Results/Data Urine  COLOR YELLOW  APPEARANCE CLEAR  SPECIFIC GRAVITY 1.030  pH 5.5  GLUCOSE NEGATIVE  BILIRUBIN NEGATIVE  KETONE NEGATIVE  BLOOD TRACE  PROTEIN NEGATIVE  NITRITE NEGATIVE  LEUKOCYTE ESTERASE NEGATIVE  SQUAMOUS EPITHELIAL/HPF 0-5 HPF WBC 0-5 WBC/HPF RBC 0-2 RBC/HPF BACTERIA NONE SEEN HPF CRYSTALS See Below HPF CASTS NONE SEEN LPF Other SN  Yeast NONE SEEN HPF  KUB:  Normal bony prominences  A 6 mm left ureteral stone is seen between L2 and L3. It is best seen on an inverted image. I do not definitively see the known right renal stone due to bowel gas.    Impression:       6 mm left ureteral stone between L2 and L3 again visualized. Right renal stone not definitively seen.   Assessment   I discussed treatment options with the patient regarding his bilateral stones. We discussed post cystoscopy with ureteroscopy and ESWL. He has elected to undergo left ESWL  as he does not want to undergo an invasive procedure. He understands the risks, benefits, and indications of this procedure. He understands the risks include but are not limited to bleeding, infection, and repeat procedures. He understands that this will only address his left stone and will need further procedures to address his right renal  stone.    On review of his KUB, I'm able to see his 6 mm left ureteral stone between the L2 and L3 spinous processes. His best visualized with an inverted image. I marked the stone with an arrow under the annotation section of the KUB.     I cannot definitively say that I see the right nonobstructing renal stone on the KUB due to bowel gas patterns. Hopefully on future KUB it will be visible so it will also be amenable to ESWL.   Plan      1. Left ureteral stone  -Left ESWL    2. Right nonobstructing renal cell  We will address after above    3. Asymptomatic microscopic hematuria  We will ensure this resolves after treating the above stones.    4. Small penile condyloma  The patient will continue treatment with Condylox. He was instructed to continue the treatment for a few cycles even after the wart has visually disappeared.

## 2015-08-06 NOTE — Discharge Instructions (Signed)
Lithotripsy, Care After °Refer to this sheet in the next few weeks. These instructions provide you with information on caring for yourself after your procedure. Your health care provider may also give you more specific instructions. Your treatment has been planned according to current medical practices, but problems sometimes occur. Call your health care provider if you have any problems or questions after your procedure. °WHAT TO EXPECT AFTER THE PROCEDURE  °· Your urine may have a red tinge for a few days after treatment. Blood loss is usually minimal. °· You may have soreness in the back or flank area. This usually goes away after a few days. The procedure can cause blotches or bruises on the back where the pressure wave enters the skin. These marks usually cause only minimal discomfort and should disappear in a short time. °· Stone fragments should begin to pass within 24 hours of treatment. However, a delayed passage is not unusual. °· You may have pain, discomfort, and feel sick to your stomach (nauseated) when the crushed fragments of stone are passed down the tube from the kidney to the bladder. Stone fragments can pass soon after the procedure and may last for up to 4-8 weeks. °· A small number of patients may have severe pain when stone fragments are not able to pass, which leads to an obstruction. °· If your stone is greater than 1 inch (2.5 cm) in diameter or if you have multiple stones that have a combined diameter greater than 1 inch (2.5 cm), you may require more than one treatment. °· If you had a stent placed prior to your procedure, you may experience some discomfort, especially during urination. You may experience the pain or discomfort in your flank or back, or you may experience a sharp pain or discomfort at the base of your penis or in your lower abdomen. The discomfort usually lasts only a few minutes after urinating. °HOME CARE INSTRUCTIONS  °· Rest at home until you feel your energy  improving. °· Only take over-the-counter or prescription medicines for pain, discomfort, or fever as directed by your health care provider. Depending on the type of lithotripsy, you may need to take antibiotics and anti-inflammatory medicines for a few days. °· Drink enough water and fluids to keep your urine clear or pale yellow. This helps "flush" your kidneys. It helps pass any remaining pieces of stone and prevents stones from coming back. °· Most people can resume daily activities within 1-2 days after standard lithotripsy. It can take longer to recover from laser and percutaneous lithotripsy. °· Strain all urine through the provided strainer. Keep all particulate matter and stones for your health care provider to see. The stone may be as small as a grain of salt. It is very important to use the strainer each and every time you pass your urine. Any stones that are found can be sent to a medical lab for examination. °· Visit your health care provider for a follow-up appointment in a few weeks. Your doctor may remove your stent if you have one. Your health care provider will also check to see whether stone particles still remain. °SEEK MEDICAL CARE IF:  °· Your pain is not relieved by medicine. °· You have a lasting nauseous feeling. °· You feel there is too much blood in the urine. °· You develop persistent problems with frequent or painful urination that does not at least partially improve after 2 days following the procedure. °· You have a congested cough. °· You feel   lightheaded. °· You develop a rash or any other signs that might suggest an allergic problem. °· You develop any reaction or side effects to your medicine(s). °SEEK IMMEDIATE MEDICAL CARE IF:  °· You experience severe back or flank pain or both. °· You see nothing but blood when you urinate. °· You cannot pass any urine at all. °· You have a fever or shaking chills. °· You develop shortness of breath, difficulty breathing, or chest pain. °· You  develop vomiting that will not stop after 6-8 hours. °· You have a fainting episode. °  °This information is not intended to replace advice given to you by your health care provider. Make sure you discuss any questions you have with your health care provider. °  °Document Released: 05/05/2007 Document Revised: 01/04/2015 Document Reviewed: 10/29/2012 °Elsevier Interactive Patient Education ©2016 [employer] ° °

## 2015-08-07 ENCOUNTER — Encounter (HOSPITAL_COMMUNITY): Payer: Self-pay | Admitting: General Practice

## 2015-08-07 ENCOUNTER — Encounter (HOSPITAL_COMMUNITY)
Admission: RE | Disposition: A | Discharge status: Home / Self Care | Payer: Self-pay | Source: Ambulatory Visit | Attending: Urology

## 2015-08-07 ENCOUNTER — Ambulatory Visit (HOSPITAL_COMMUNITY)
Admission: RE | Admit: 2015-08-07 | Discharge: 2015-08-07 | Disposition: A | Discharge status: Home / Self Care | Payer: 59 | Source: Ambulatory Visit | Attending: Urology | Admitting: Urology

## 2015-08-07 ENCOUNTER — Ambulatory Visit (HOSPITAL_COMMUNITY): Payer: 59

## 2015-08-07 DIAGNOSIS — I10 Essential (primary) hypertension: Secondary | ICD-10-CM | POA: Diagnosis not present

## 2015-08-07 DIAGNOSIS — Z538 Procedure and treatment not carried out for other reasons: Secondary | ICD-10-CM | POA: Diagnosis not present

## 2015-08-07 DIAGNOSIS — N200 Calculus of kidney: Secondary | ICD-10-CM | POA: Diagnosis not present

## 2015-08-07 SURGERY — LITHOTRIPSY, ESWL
Anesthesia: LOCAL | Laterality: Left

## 2015-08-07 MED ORDER — DIPHENHYDRAMINE HCL 25 MG PO CAPS
25.0000 mg | ORAL_CAPSULE | ORAL | Status: AC
  Administered 2015-08-07: 25 mg via ORAL
  Filled 2015-08-07: qty 1

## 2015-08-07 MED ORDER — OXYCODONE HCL 10 MG PO TABS: 10.0000 mg | ORAL_TABLET | ORAL | Status: DC | PRN

## 2015-08-07 MED ORDER — CIPROFLOXACIN HCL 500 MG PO TABS
500.0000 mg | ORAL_TABLET | ORAL | Status: AC
  Administered 2015-08-07: 500 mg via ORAL
  Filled 2015-08-07: qty 1

## 2015-08-07 MED ORDER — DIAZEPAM 5 MG PO TABS
10.0000 mg | ORAL_TABLET | ORAL | Status: AC
  Administered 2015-08-07: 10 mg via ORAL
  Filled 2015-08-07: qty 2

## 2015-08-07 MED ORDER — SODIUM CHLORIDE 0.9 % IV SOLN
INTRAVENOUS | Status: DC
  Administered 2015-08-07: 08:00:00 via INTRAVENOUS

## 2015-08-07 NOTE — Interval H&P Note (Signed)
History and Physical Interval Note:  08/07/2015 3:52 AM  Casey Kelly  has presented today for surgery, with the diagnosis of NEPHROLITHIASIS  The various methods of treatment have been discussed with the patient and family. After consideration of risks, benefits and other options for treatment, the patient has consented to  Procedure(s): EXTRACORPOREAL SHOCK WAVE LITHOTRIPSY (ESWL) (Left) as a surgical intervention .  The patient's history has been reviewed, patient examined, no change in status, stable for surgery.  I have reviewed the patient's chart and labs.  Questions were answered to the patient's satisfaction.     Claybon Jabs

## 2015-08-07 NOTE — Interval H&P Note (Signed)
History and Physical Interval Note:  08/07/2015 3:51 AM  Casey Kelly  has presented today for surgery, with the diagnosis of NEPHROLITHIASIS  The various methods of treatment have been discussed with the patient and family. After consideration of risks, benefits and other options for treatment, the patient has consented to  Procedure(s): EXTRACORPOREAL SHOCK WAVE LITHOTRIPSY (ESWL) (Left) as a surgical intervention .  The patient's history has been reviewed, patient examined, no change in status, stable for surgery.  I have reviewed the patient's chart and labs.  Questions were answered to the patient's satisfaction.     Claybon Jabs

## 2015-08-07 NOTE — Progress Notes (Signed)
Procedure cancelled on lithotripsy Lucianne Lei, unable to visualize stone. Pt. To follow-up with office.

## 2015-08-08 ENCOUNTER — Other Ambulatory Visit: Payer: Self-pay | Admitting: Urology

## 2015-08-08 ENCOUNTER — Encounter (HOSPITAL_BASED_OUTPATIENT_CLINIC_OR_DEPARTMENT_OTHER): Payer: Self-pay | Admitting: *Deleted

## 2015-08-08 NOTE — Progress Notes (Signed)
NPO AFTER MN.  ARRIVE AT 0830.  NEEDS ISTAT AND EKG.  WILL TAKE METOPROLOL AND NORVASC AM DOS W/ SIPS OF WATER AND IF NEEDED TAKE OXYCODONE.

## 2015-08-15 ENCOUNTER — Other Ambulatory Visit: Payer: Self-pay

## 2015-08-15 ENCOUNTER — Ambulatory Visit (HOSPITAL_BASED_OUTPATIENT_CLINIC_OR_DEPARTMENT_OTHER): Payer: 59 | Admitting: Anesthesiology

## 2015-08-15 ENCOUNTER — Ambulatory Visit (HOSPITAL_BASED_OUTPATIENT_CLINIC_OR_DEPARTMENT_OTHER)
Admission: RE | Admit: 2015-08-15 | Discharge: 2015-08-15 | Disposition: A | Discharge status: Home / Self Care | Payer: 59 | Source: Ambulatory Visit | Attending: Urology | Admitting: Urology

## 2015-08-15 ENCOUNTER — Encounter (HOSPITAL_BASED_OUTPATIENT_CLINIC_OR_DEPARTMENT_OTHER)
Admission: RE | Disposition: A | Discharge status: Home / Self Care | Payer: Self-pay | Source: Ambulatory Visit | Attending: Urology

## 2015-08-15 ENCOUNTER — Encounter (HOSPITAL_BASED_OUTPATIENT_CLINIC_OR_DEPARTMENT_OTHER): Payer: Self-pay | Admitting: Anesthesiology

## 2015-08-15 DIAGNOSIS — N202 Calculus of kidney with calculus of ureter: Secondary | ICD-10-CM | POA: Diagnosis not present

## 2015-08-15 DIAGNOSIS — Z87442 Personal history of urinary calculi: Secondary | ICD-10-CM | POA: Diagnosis not present

## 2015-08-15 DIAGNOSIS — G4733 Obstructive sleep apnea (adult) (pediatric): Secondary | ICD-10-CM | POA: Insufficient documentation

## 2015-08-15 DIAGNOSIS — I10 Essential (primary) hypertension: Secondary | ICD-10-CM | POA: Diagnosis not present

## 2015-08-15 DIAGNOSIS — K5090 Crohn's disease, unspecified, without complications: Secondary | ICD-10-CM | POA: Insufficient documentation

## 2015-08-15 DIAGNOSIS — Z79899 Other long term (current) drug therapy: Secondary | ICD-10-CM | POA: Insufficient documentation

## 2015-08-15 DIAGNOSIS — N200 Calculus of kidney: Secondary | ICD-10-CM

## 2015-08-15 HISTORY — PX: HOLMIUM LASER APPLICATION: SHX5852

## 2015-08-15 HISTORY — DX: Obstructive sleep apnea (adult) (pediatric): G47.33

## 2015-08-15 HISTORY — DX: Calculus of kidney: N20.0

## 2015-08-15 HISTORY — DX: Calculus of ureter: N20.1

## 2015-08-15 HISTORY — DX: Personal history of other diseases of the respiratory system: Z87.09

## 2015-08-15 HISTORY — DX: Presence of spectacles and contact lenses: Z97.3

## 2015-08-15 HISTORY — DX: Allergic rhinitis, unspecified: J30.9

## 2015-08-15 HISTORY — PX: CYSTOSCOPY/URETEROSCOPY/HOLMIUM LASER/STENT PLACEMENT: SHX6546

## 2015-08-15 HISTORY — DX: Testicular hypofunction: E29.1

## 2015-08-15 LAB — POCT I-STAT 4, (NA,K, GLUC, HGB,HCT)
Glucose, Bld: 108 mg/dL — ABNORMAL HIGH (ref 65–99)
HEMATOCRIT: 47 % (ref 39.0–52.0)
HEMOGLOBIN: 16 g/dL (ref 13.0–17.0)
POTASSIUM: 3.6 mmol/L (ref 3.5–5.1)
SODIUM: 143 mmol/L (ref 135–145)

## 2015-08-15 SURGERY — CYSTOSCOPY/URETEROSCOPY/HOLMIUM LASER/STENT PLACEMENT
Anesthesia: General | Site: Ureter | Laterality: Bilateral

## 2015-08-15 MED ORDER — KETOROLAC TROMETHAMINE 30 MG/ML IJ SOLN
INTRAMUSCULAR | Status: DC | PRN
  Administered 2015-08-15: 30 mg via INTRAVENOUS

## 2015-08-15 MED ORDER — CEFAZOLIN SODIUM-DEXTROSE 2-4 GM/100ML-% IV SOLN
INTRAVENOUS | Status: AC
  Filled 2015-08-15: qty 100

## 2015-08-15 MED ORDER — KETOROLAC TROMETHAMINE 30 MG/ML IJ SOLN
INTRAMUSCULAR | Status: AC
  Filled 2015-08-15: qty 1

## 2015-08-15 MED ORDER — DEXAMETHASONE SODIUM PHOSPHATE 10 MG/ML IJ SOLN
INTRAMUSCULAR | Status: AC
  Filled 2015-08-15: qty 1

## 2015-08-15 MED ORDER — PROPOFOL 10 MG/ML IV BOLUS
INTRAVENOUS | Status: DC | PRN
  Administered 2015-08-15: 250 mg via INTRAVENOUS

## 2015-08-15 MED ORDER — PROMETHAZINE HCL 25 MG/ML IJ SOLN
6.2500 mg | INTRAMUSCULAR | Status: DC | PRN
  Filled 2015-08-15: qty 1

## 2015-08-15 MED ORDER — FENTANYL CITRATE (PF) 100 MCG/2ML IJ SOLN
INTRAMUSCULAR | Status: AC
Start: 2015-08-15 — End: 2015-08-15
  Filled 2015-08-15: qty 2

## 2015-08-15 MED ORDER — MIDAZOLAM HCL 5 MG/5ML IJ SOLN
INTRAMUSCULAR | Status: DC | PRN
  Administered 2015-08-15: 2 mg via INTRAVENOUS

## 2015-08-15 MED ORDER — CEFAZOLIN SODIUM-DEXTROSE 2-4 GM/100ML-% IV SOLN
2.0000 g | INTRAVENOUS | Status: DC
  Filled 2015-08-15: qty 100

## 2015-08-15 MED ORDER — FENTANYL CITRATE (PF) 100 MCG/2ML IJ SOLN
INTRAMUSCULAR | Status: DC | PRN
  Administered 2015-08-15 (×3): 50 ug via INTRAVENOUS

## 2015-08-15 MED ORDER — FENTANYL CITRATE (PF) 100 MCG/2ML IJ SOLN
25.0000 ug | INTRAMUSCULAR | Status: DC | PRN
  Filled 2015-08-15: qty 1

## 2015-08-15 MED ORDER — LACTATED RINGERS IV SOLN
INTRAVENOUS | Status: DC
  Administered 2015-08-15 (×2): via INTRAVENOUS
  Filled 2015-08-15: qty 1000

## 2015-08-15 MED ORDER — PROPOFOL 10 MG/ML IV BOLUS
INTRAVENOUS | Status: AC
  Filled 2015-08-15: qty 20

## 2015-08-15 MED ORDER — CEFAZOLIN SODIUM 10 G IJ SOLR
3.0000 g | INTRAMUSCULAR | Status: AC
  Administered 2015-08-15: 3 g via INTRAVENOUS
  Filled 2015-08-15: qty 3000

## 2015-08-15 MED ORDER — MIDAZOLAM HCL 2 MG/2ML IJ SOLN
INTRAMUSCULAR | Status: AC
  Filled 2015-08-15: qty 2

## 2015-08-15 MED ORDER — LIDOCAINE HCL (CARDIAC) 20 MG/ML IV SOLN
INTRAVENOUS | Status: DC | PRN
  Administered 2015-08-15: 100 mg via INTRAVENOUS

## 2015-08-15 MED ORDER — HYDROCODONE-ACETAMINOPHEN 5-325 MG PO TABS: 1.0000 | ORAL_TABLET | Freq: Four times a day (QID) | ORAL | Status: DC | PRN

## 2015-08-15 MED ORDER — FENTANYL CITRATE (PF) 100 MCG/2ML IJ SOLN
INTRAMUSCULAR | Status: AC
  Filled 2015-08-15: qty 2

## 2015-08-15 MED ORDER — SODIUM CHLORIDE 0.9 % IR SOLN
Status: DC | PRN
  Administered 2015-08-15: 1000 mL
  Administered 2015-08-15: 3000 mL

## 2015-08-15 MED ORDER — CEPHALEXIN 500 MG PO CAPS: 500.0000 mg | ORAL_CAPSULE | Freq: Three times a day (TID) | ORAL | Status: DC

## 2015-08-15 MED ORDER — CEFAZOLIN SODIUM 1-5 GM-% IV SOLN
INTRAVENOUS | Status: AC
  Filled 2015-08-15: qty 50

## 2015-08-15 MED ORDER — WHITE PETROLATUM GEL
Status: AC
  Filled 2015-08-15: qty 5

## 2015-08-15 MED ORDER — DEXAMETHASONE SODIUM PHOSPHATE 4 MG/ML IJ SOLN
INTRAMUSCULAR | Status: DC | PRN
  Administered 2015-08-15: 10 mg via INTRAVENOUS

## 2015-08-15 MED ORDER — ONDANSETRON HCL 4 MG/2ML IJ SOLN
INTRAMUSCULAR | Status: AC
  Filled 2015-08-15: qty 2

## 2015-08-15 MED ORDER — ONDANSETRON HCL 4 MG/2ML IJ SOLN
INTRAMUSCULAR | Status: DC | PRN
  Administered 2015-08-15: 4 mg via INTRAVENOUS

## 2015-08-15 MED ORDER — HYDROCODONE-ACETAMINOPHEN 7.5-325 MG PO TABS
ORAL_TABLET | ORAL | Status: AC
  Filled 2015-08-15: qty 1

## 2015-08-15 MED ORDER — IOPAMIDOL (ISOVUE-370) INJECTION 76%
INTRAVENOUS | Status: DC | PRN
  Administered 2015-08-15: 45 mL

## 2015-08-15 MED ORDER — HYDROCODONE-ACETAMINOPHEN 7.5-325 MG PO TABS
1.0000 | ORAL_TABLET | Freq: Once | ORAL | Status: AC | PRN
  Administered 2015-08-15: 1 via ORAL
  Filled 2015-08-15: qty 1

## 2015-08-15 SURGICAL SUPPLY — 26 items
BAG DRAIN URO-CYSTO SKYTR STRL (DRAIN) ×2 IMPLANT
BAG DRN UROCATH (DRAIN) ×1
BASKET ZERO TIP NITINOL 2.4FR (BASKET) ×1 IMPLANT
BSKT STON RTRVL ZERO TP 2.4FR (BASKET) ×1
CATH URET 5FR 28IN OPEN ENDED (CATHETERS) ×2 IMPLANT
CATH URET DUAL LUMEN 6-10FR 50 (CATHETERS) IMPLANT
CLOTH BEACON ORANGE TIMEOUT ST (SAFETY) ×2 IMPLANT
FIBER LASER TRAC TIP (UROLOGICAL SUPPLIES) ×1 IMPLANT
GLOVE BIO SURGEON STRL SZ 6.5 (GLOVE) ×1 IMPLANT
GLOVE BIO SURGEON STRL SZ7.5 (GLOVE) ×2 IMPLANT
GLOVE INDICATOR 6.5 STRL GRN (GLOVE) ×2 IMPLANT
GOWN STRL REUS W/ TWL LRG LVL3 (GOWN DISPOSABLE) IMPLANT
GOWN STRL REUS W/ TWL XL LVL3 (GOWN DISPOSABLE) ×1 IMPLANT
GOWN STRL REUS W/TWL LRG LVL3 (GOWN DISPOSABLE) ×2
GOWN STRL REUS W/TWL XL LVL3 (GOWN DISPOSABLE) ×2
GUIDEWIRE STR DUAL SENSOR (WIRE) ×2 IMPLANT
IV NS IRRIG 3000ML ARTHROMATIC (IV SOLUTION) ×2 IMPLANT
KIT ROOM TURNOVER WOR (KITS) ×2 IMPLANT
MANIFOLD NEPTUNE II (INSTRUMENTS) ×2 IMPLANT
NS IRRIG 500ML POUR BTL (IV SOLUTION) IMPLANT
PACK CYSTOSCOPY (CUSTOM PROCEDURE TRAY) ×2 IMPLANT
SCRUB PCMX 4 OZ (MISCELLANEOUS) ×2 IMPLANT
SHEATH ACCESS URETERAL 38CM (SHEATH) ×1 IMPLANT
STENT URET 6FRX28 CONTOUR (STENTS) ×2 IMPLANT
SYRINGE IRR TOOMEY STRL 70CC (SYRINGE) ×2 IMPLANT
TUBE CONNECTING 12X1/4 (SUCTIONS) ×2 IMPLANT

## 2015-08-15 NOTE — Op Note (Signed)
Date of procedure: 08/15/2015  Preoperative diagnosis:  1.  Left ureteral stone 2. Right renal stone  Postoperative diagnosis:  1. Left ureteral stone 2. Right renal stone   Procedure: 1. Cystoscopy 2. Bilateral ureteroscopy 3. Laser lithotripsy 4. Stone basketing 5. Bilateral retrograde pyelogram with interpretation 6. Bilateral ureteral stent placement (6 [place] by 20 cm)  Surgeon: Baruch Gouty, MD  Anesthesia: General  Complications: None  Intraoperative findings: The patient a left ureteral stone that was removed in one piece. His right renal stone was tested. Bilateral retrograde pyelograms at the procedure showed no residual stone fragments.  EBL: None  Specimens: Left ureteral stone office lab  Drains: Bilateral 6 French by 28 cm ureteral stents  Disposition: Stable to the postanesthesia care unit  Indication for procedure: The patient is a 40 y.o. male with 5 mm left ureteral stone and a 8 mm right renal stone who presents today for definitive management.  After reviewing the management options for treatment, the patient elected to proceed with the above surgical procedure(s). We have discussed the potential benefits and risks of the procedure, side effects of the proposed treatment, the likelihood of the patient achieving the goals of the procedure, and any potential problems that might occur during the procedure or recuperation. Informed consent has been obtained.  Description of procedure: The patient was met in the preoperative area. All risks, benefits, and indications of the procedure were described in great detail. The patient consented to the procedure. Preoperative antibiotics were given. The patient was taken to the operative theater. General anesthesia was induced per the anesthesia service. The patient was then placed in the dorsal lithotomy position and prepped and draped in the usual sterile fashion. A preoperative timeout was called.   A 21 French 30  cystoscope was inserted into the patient's bladder per urethra atraumatically. Left ear orifice was visualized and intubated with a sensor wire. A summary rigid ureteroscope was then exchanged inserted in the distal ureter where the known stone was encountered and grasped with stone basket and removed intact. There is a moderate amount of trauma this time noted. A left retropyelogram showed residual stone from its. The cystoscope was reassembled over the sensor wire. A 6 French by 20 cm double-J ureteral stent was placed and sensor were removed. The stent was confirmed to be necrotic location of the curl seen in the patient's urinary bladder direct position a curl seen in the patient's renal pelvis with fluoroscopy. Attention was turned to the right ureteral orifice this time, 2 sensor wires were placed up to level the renal pelvis with the aid of a dual lumen catheter. Cystoscope was withdrawn attempt was made to place a ureteral access sheath under fluoroscopy. This did not advance easily so this was aborted. A flexible ureteroscope was then displaced around the sensor wires up to level renal pelvis under fluoroscopy very easily. The known stone was noted in the mid pole. It was broken into very small fragments laser lithotripsy. He was noted to be very soft. There is no fragments greater than 3 mm at the end the procedure. Retrograde powder and show no residual stone fragments on the right that were large in size. The ureteroscope was removed and a 6 [place] by 28 cm double-J ureteral stent was placed identical fashion as on the left. There is good drainage from both stents. The infraumbilical position. The patient's bladder was drained. His wound from anesthesia and transferred stable condition to postanesthesia care unit.  Plan: The patient  will follow-up in one week for stent removal. He will need an ultrasound in 1 month.   , M.D.    

## 2015-08-15 NOTE — Anesthesia Procedure Notes (Signed)
Procedure Name: LMA Insertion Date/Time: 08/15/2015 9:51 AM Performed by: Wanita Chamberlain Pre-anesthesia Checklist: Patient identified, Timeout performed, Emergency Drugs available, Suction available and Patient being monitored Patient Re-evaluated:Patient Re-evaluated prior to inductionOxygen Delivery Method: Circle system utilized Preoxygenation: Pre-oxygenation with 100% oxygen Intubation Type: IV induction Ventilation: Mask ventilation without difficulty LMA: LMA inserted LMA Size: 5.0 Number of attempts: 1 Placement Confirmation: positive ETCO2 and breath sounds checked- equal and bilateral Tube secured with: Tape Dental Injury: Teeth and Oropharynx as per pre-operative assessment

## 2015-08-15 NOTE — Transfer of Care (Signed)
Immediate Anesthesia Transfer of Care Note  Patient: [name]  Procedure(s) Performed: Procedure(s): CYSTOSCOPY/ LEFT URETEROSCOPY, POSSIBLE RIGHT URETEROSCOPY /HOLMIUM LASER/STENT PLACEMENT (Bilateral) HOLMIUM LASER APPLICATION (Bilateral)  Patient Location: PACU  Anesthesia Type:General  Level of Consciousness: awake, alert , oriented and patient cooperative  Airway & Oxygen Therapy: Patient Spontanous Breathing and Patient connected to nasal cannula oxygen  Post-op Assessment: Report given to RN and Post -op Vital signs reviewed and stable  Post vital signs: Reviewed and stable  Last Vitals:  Filed Vitals:   08/15/15 0837  BP: 150/93  Pulse: 77  Temp: 36.9 C  Resp: 16    Complications: No apparent anesthesia complications

## 2015-08-15 NOTE — Discharge Instructions (Signed)
°  Post Anesthesia Home Care Instructions  Activity: Get plenty of rest for the remainder of the day. A responsible adult should stay with you for 24 hours following the procedure.  For the next 24 hours, DO NOT: -Drive a car -Paediatric nurse -Drink alcoholic beverages -Take any medication unless instructed by your physician -Make any legal decisions or sign important papers.  Meals: Start with liquid foods such as gelatin or soup. Progress to regular foods as tolerated. Avoid greasy, spicy, heavy foods. If nausea and/or vomiting occur, drink only clear liquids until the nausea and/or vomiting subsides. Call your physician if vomiting continues.  Special Instructions/Symptoms: Your throat may feel dry or sore from the anesthesia or the breathing tube placed in your throat during surgery. If this causes discomfort, gargle with warm salt water. The discomfort should disappear within 24 hours.  If you had a scopolamine patch placed behind your ear for the management of post- operative nausea and/or vomiting:  1. The medication in the patch is effective for 72 hours, after which it should be removed.  Wrap patch in a tissue and discard in the trash. Wash hands thoroughly with soap and water. 2. You may remove the patch earlier than 72 hours if you experience unpleasant side effects which may include dry mouth, dizziness or visual disturbances. 3. Avoid touching the patch. Wash your hands with soap and water after contact with the patch.   CYSTOSCOPY HOME CARE INSTRUCTIONS  Activity: Rest for the remainder of the day.  Do not drive or operate equipment today.  You may resume normal activities in one to two days as instructed by your physician.   Meals: Drink plenty of liquids and eat light foods such as gelatin or soup this evening.  You may return to a normal meal plan tomorrow.  Return to Work: You may return to work in one to two days or as instructed by your physician.  Special  Instructions / Symptoms: Call your physician if any of these symptoms occur:   -persistent or heavy bleeding  -bleeding which continues after first few urination  -large blood clots that are difficult to pass  -urine stream diminishes or stops completely  -fever equal to or higher than 101 degrees Farenheit.  -cloudy urine with a strong, foul odor  -severe pain  Females should always wipe from front to back after elimination.  You may feel some burning pain when you urinate.  This should disappear with time.  Applying moist heat to the lower abdomen or a hot tub bath may help relieve the pain. \  Follow-Up / Date of Return Visit to Your Physician:   Call for an appointment to arrange follow-up.  Patient Signature:  ________________________________________________________  Nurse's Signature:  ________________________________________________________

## 2015-08-15 NOTE — H&P (Signed)
8:56 AM   Casey Kelly [birth year] [id number]  CC: Bilateral nephrolithiasis  HPI:  [name] with left ureteral calculus and right renal calculus presenting for URS.     PMH: Past Medical History  Diagnosis Date  . Bruxism   . Plantar fasciitis 2010    bilateral  . History of kidney stones   . Crohn's disease ([place])     no current med.  [name] History of chronic sinusitis   . Hypertension   . Allergic rhinitis   . Left ureteral stone   . History of kidney stones   . Nephrolithiasis     bilateral  non-obstructive per urologist note  . Hypogonadism male   . Mild obstructive sleep apnea     per study 10-05-2009--  INTERMITTANT CPAP USE  . Wears contact lenses     Surgical History: Past Surgical History  Procedure Laterality Date  . Ethmoidectomy Bilateral 07/17/2009    anterior endoscopic , polypectomy, and bilateral maxillay antrostomy  . Adenoidectomy  as child  . Hand surgery Right     fingertip amputation  . Tympanostomy tube placement  as child  . Nasal sinus surgery  2016   at [facility] Medications:    Medication List    ASK your doctor about these medications        amLODipine 5 MG tablet  Commonly known as:  NORVASC  Take 1 tablet (5 mg total) by mouth daily.     diphenoxylate-atropine 2.5-0.025 MG tablet  Commonly known as:  LOMOTIL  Take 1 tablet by mouth 4 (four) times daily as needed for diarrhea or loose stools.     HYDROcodone-acetaminophen 5-325 MG tablet  Commonly known as:  NORCO/VICODIN  Take 1 tablet by mouth every 6 (six) hours as needed for moderate pain.     ibuprofen 600 MG tablet  Commonly known as:  ADVIL,MOTRIN  Take 1 tablet (600 mg total) by mouth every 8 (eight) hours as needed.     metoprolol succinate 25 MG 24 hr tablet  Commonly known as:  TOPROL-XL  Take 1 tablet (25 mg total) by mouth daily. NEEDS APPOINTMENT FOR FUTURE REFILLS.     Oxycodone HCl 10 MG Tabs  Take 1 tablet (10 mg total) by mouth every 4 (four)  hours as needed.     podofilox 0.5 % gel  Commonly known as:  CONDYLOX  As directed     SYRINGE-NEEDLE (DISP) 3 ML 23G X 1-1/2" 3 ML Misc  Commonly known as:  B-D 3CC LUER-LOK SYR 23GX1-1/2  1 Units by Does not apply route every 14 (fourteen) days. fot testoterone inj IM q 14 d        Allergies:  Allergies  Allergen Reactions  . Losartan     Nasal congestion    Family History: Family History  Problem Relation Age of Onset  . Allergic rhinitis Mother   . Breast cancer Mother   . Lung cancer Father   . Colon polyps Father   . Prostate cancer Father   . Cancer Father 24    lung ca  . Allergic rhinitis Daughter   . Allergic rhinitis Son   . Asthma Son     Social History:  reports that he has never smoked. He has never used smokeless tobacco. He reports that he does not drink alcohol or use illicit drugs.  ROS: 12 point ROS negative except for above  Physical Exam: BP 150/93 mmHg  Pulse 77  Temp(Src) 98.5 F (36.9 C) (Oral)  Resp 16  Wt 282 lb 8 oz (128.141 kg)  SpO2 95%  Constitutional:  Alert and oriented, No acute distress. HEENT: [place] AT, moist mucus membranes.  Trachea midline, no masses. Cardiovascular: No clubbing, cyanosis, or edema. Respiratory: Normal respiratory effort, no increased work of breathing. GI: Abdomen is soft, nontender, nondistended, no abdominal masses GU: No CVA tenderness. Skin: No rashes, bruises or suspicious lesions. Lymph: No cervical or inguinal adenopathy. Neurologic: Grossly intact, no focal deficits, moving all 4 extremities. Psychiatric: Normal mood and affect.  Laboratory Data: Lab Results  Component Value Date   WBC 7.8 07/04/2015   HGB 14.7 07/04/2015   HCT 44.0 07/04/2015   MCV 81.1 07/04/2015   PLT 390.0 07/04/2015    Lab Results  Component Value Date   CREATININE 1.11 07/04/2015    Lab Results  Component Value Date   PSA 0.28 08/30/2014   PSA  0.23 06/20/2014    Lab Results  Component Value Date   TESTOSTERONE 305 08/30/2014    Lab Results  Component Value Date   HGBA1C 5.9 06/20/2014    Urinalysis    Component Value Date/Time   COLORURINE YELLOW 06/20/2014 1112   APPEARANCEUR CLEAR 06/20/2014 1112   LABSPEC 1.025 06/20/2014 1112   PHURINE 6.5 06/20/2014 1112   GLUCOSEU NEGATIVE 06/20/2014 1112   HGBUR TRACE-INTACT* 06/20/2014 1112   BILIRUBINUR 1+ 07/04/2015 1339   BILIRUBINUR NEGATIVE 06/20/2014 1112   KETONESUR NEGATIVE 06/20/2014 1112   PROTEINUR +- 07/04/2015 1339   UROBILINOGEN negative 07/04/2015 1339   UROBILINOGEN 0.2 06/20/2014 1112   NITRITE negative 07/04/2015 1339   NITRITE NEGATIVE 06/20/2014 1112   LEUKOCYTESUR Negative 07/04/2015 1339     Assessment & Plan:    1. Left ureteral calculus 2. Right renal calculus -cystoscopy, bilateral ureteroscopy, laser lithotripsy, bilateral ureteral stents. Plan to address left side first  Nickie Retort, MD

## 2015-08-15 NOTE — Anesthesia Postprocedure Evaluation (Signed)
Anesthesia Post Note  Patient: DYLAND PANUCO  Procedure(s) Performed: Procedure(s) (LRB): CYSTOSCOPY/ LEFT URETEROSCOPY, POSSIBLE RIGHT URETEROSCOPY /HOLMIUM LASER/STENT PLACEMENT (Bilateral) HOLMIUM LASER APPLICATION (Bilateral)  Patient location during evaluation: PACU Anesthesia Type: General Level of consciousness: awake and alert Pain management: pain level controlled Vital Signs Assessment: post-procedure vital signs reviewed and stable Respiratory status: spontaneous breathing, nonlabored ventilation and respiratory function stable Cardiovascular status: blood pressure returned to baseline and stable Postop Assessment: no signs of nausea or vomiting Anesthetic complications: no    Last Vitals:  Filed Vitals:   08/15/15 1200 08/15/15 1239  BP: 152/108 159/97  Pulse: 72 69  Temp:  36.8 C  Resp: 15 16    Last Pain:  Filed Vitals:   08/15/15 1240  PainSc: 5                  Tiajuana Amass

## 2015-08-15 NOTE — Anesthesia Preprocedure Evaluation (Addendum)
Anesthesia Evaluation  Patient identified by MRN, date of birth, ID band Patient awake    Reviewed: Allergy & Precautions, NPO status , Patient's Chart, lab work & pertinent test results, reviewed documented beta blocker date and time   Airway Mallampati: III  TM Distance: >3 FB Neck ROM: Full    Dental  (+) Dental Advisory Given   Pulmonary sleep apnea ,    breath sounds clear to auscultation       Cardiovascular hypertension, Pt. on medications and Pt. on home beta blockers  Rhythm:Regular Rate:Normal     Neuro/Psych negative neurological ROS     GI/Hepatic negative GI ROS, Neg liver ROS,   Endo/Other  negative endocrine ROS  Renal/GU Renal disease     Musculoskeletal   Abdominal   Peds  Hematology negative hematology ROS (+)   Anesthesia Other Findings   Reproductive/Obstetrics                            Lab Results  Component Value Date   WBC 7.8 07/04/2015   HGB 14.7 07/04/2015   HCT 44.0 07/04/2015   MCV 81.1 07/04/2015   PLT 390.0 07/04/2015   Lab Results  Component Value Date   CREATININE 1.11 07/04/2015   BUN 16 07/04/2015   NA 141 07/04/2015   K 4.2 07/04/2015   CL 105 07/04/2015   CO2 28 07/04/2015   Lab Results  Component Value Date   INR 0.9 10/10/2010    Anesthesia Physical Anesthesia Plan  ASA: II  Anesthesia Plan: General   Post-op Pain Management:    Induction: Intravenous  Airway Management Planned: LMA  Additional Equipment:   Intra-op Plan:   Post-operative Plan: Extubation in OR  Informed Consent: I have reviewed the patients History and Physical, chart, labs and discussed the procedure including the risks, benefits and alternatives for the proposed anesthesia with the patient or authorized representative who has indicated his/her understanding and acceptance.     Plan Discussed with:   Anesthesia Plan Comments:          Anesthesia Quick Evaluation

## 2015-08-16 ENCOUNTER — Encounter (HOSPITAL_BASED_OUTPATIENT_CLINIC_OR_DEPARTMENT_OTHER): Payer: Self-pay | Admitting: Urology

## 2015-08-22 ENCOUNTER — Other Ambulatory Visit (INDEPENDENT_AMBULATORY_CARE_PROVIDER_SITE_OTHER): Payer: 59

## 2015-08-22 ENCOUNTER — Encounter: Payer: Self-pay | Admitting: Gastroenterology

## 2015-08-22 ENCOUNTER — Ambulatory Visit (INDEPENDENT_AMBULATORY_CARE_PROVIDER_SITE_OTHER): Payer: 59 | Admitting: Gastroenterology

## 2015-08-22 VITALS — BP 124/74 | HR 84 | Ht 74.0 in | Wt 286.5 lb

## 2015-08-22 DIAGNOSIS — K639 Disease of intestine, unspecified: Secondary | ICD-10-CM

## 2015-08-22 LAB — COMPREHENSIVE METABOLIC PANEL
ALK PHOS: 86 U/L (ref 39–117)
ALT: 19 U/L (ref 0–53)
AST: 16 U/L (ref 0–37)
Albumin: 3.9 g/dL (ref 3.5–5.2)
BILIRUBIN TOTAL: 0.3 mg/dL (ref 0.2–1.2)
BUN: 20 mg/dL (ref 6–23)
CO2: 29 meq/L (ref 19–32)
Calcium: 9.5 mg/dL (ref 8.4–10.5)
Chloride: 103 mEq/L (ref 96–112)
Creatinine, Ser: 1.11 mg/dL (ref 0.40–1.50)
GFR: 93.32 mL/min (ref >60.00)
GLUCOSE: 126 mg/dL — AB (ref 70–99)
Potassium: 4.4 mEq/L (ref 3.5–5.1)
SODIUM: 139 meq/L (ref 135–145)
TOTAL PROTEIN: 7.7 g/dL (ref 6.0–8.3)

## 2015-08-22 LAB — CBC WITH DIFFERENTIAL/PLATELET
BASOS ABS: 0 10*3/uL (ref 0.0–0.1)
Basophils Relative: 0.6 % (ref 0.0–3.0)
EOS ABS: 0.2 10*3/uL (ref 0.0–0.7)
Eosinophils Relative: 2.2 % (ref 0.0–5.0)
HCT: 42.3 % (ref 39.0–52.0)
Hemoglobin: 14.3 g/dL (ref 13.0–17.0)
LYMPHS ABS: 2.3 10*3/uL (ref 0.7–4.0)
Lymphocytes Relative: 28.7 % (ref 12.0–46.0)
MCHC: 33.8 g/dL (ref 30.0–36.0)
MCV: 80.7 fl (ref 78.0–100.0)
MONO ABS: 0.6 10*3/uL (ref 0.1–1.0)
MONOS PCT: 7.4 % (ref 3.0–12.0)
NEUTROS ABS: 4.9 10*3/uL (ref 1.4–7.7)
NEUTROS PCT: 61.1 % (ref 43.0–77.0)
PLATELETS: 404 10*3/uL — AB (ref 150.0–400.0)
RBC: 5.24 Mil/uL (ref 4.22–5.81)
RDW: 13.1 % (ref 11.5–15.5)
WBC: 8.1 10*3/uL (ref 4.0–10.5)

## 2015-08-22 LAB — SEDIMENTATION RATE: Sed Rate: 19 mm/hr (ref 0–22)

## 2015-08-22 LAB — HIGH SENSITIVITY CRP: CRP, High Sensitivity: 5.27 mg/L — ABNORMAL HIGH (ref 0.000–5.000)

## 2015-08-22 MED ORDER — NA SULFATE-K SULFATE-MG SULF 17.5-3.13-1.6 GM/177ML PO SOLN: 1.0000 | Freq: Once | ORAL | Status: DC

## 2015-08-22 NOTE — Progress Notes (Signed)
Review of pertinent gastrointestinal problems:  1. Crohn's disease, diagnosed September 2012 when he presented with intermittent rectal bleeding, loose stools. Colonoscopy September 2012 showed 20 cm segment in his left colon with chronic, also active colitis on biopsy. Terminal ileum found active ileitis as well on biopsy. TPMT enzyme level was normal. Small bowel follow-through was also normal. 01/2011 he was started on azathiaprine 232m per day, then no -showed for three appts from 2012 until 03/2013. He had been off all crohn's meds for 1-2 year by time of 03/2013 follow up, I started him on antispasm meds, ROV scheduled for several weeks later but he never showed for that appt.  02/2015 appt set up but he no showed again.  HPI: [name] whom I last saw about 2-1/2 years ago.  Chief complaint is Crohn's disease  He believes he is 'due for colonoscopy'  Also was wondering about disability and whether his Crohn's disease qualifies as a disability  He has blood in stool at times. Mucousy.  Will generally have BM 5-7 times per day. He is not taking anything for the crohn's.  He tried antispasm meds but cannot recall.  Rare nsaids, non smoker  He has gained weight; up 20 pounds   Takes lomotil on PRN basis when he needs to be out of the house.  Past Medical History  Diagnosis Date  . Bruxism   . Plantar fasciitis 2010    bilateral  . History of kidney stones   . [place]     no current med.  .Marland KitchenHistory of chronic sinusitis   . Hypertension   . Allergic rhinitis   . Left ureteral stone   . History of kidney stones   . Nephrolithiasis     bilateral  non-obstructive per urologist note  . Hypogonadism male   . Mild obstructive sleep apnea     per study 10-05-2009--  INTERMITTANT CPAP USE  . Wears contact lenses     Past Surgical History  Procedure Laterality Date  . Ethmoidectomy Bilateral 07/17/2009    anterior endoscopic ,  polypectomy, and bilateral maxillay antrostomy  . Adenoidectomy  as child  . Hand surgery Right     fingertip amputation  . Tympanostomy tube placement  as child  . [facility]oscopy/ureteroscopy/holmium laser/stent placement Bilateral 08/15/2015    Procedure: CYSTOSCOPY/ LEFT URETEROSCOPY, POSSIBLE RIGHT URETEROSCOPY /Melcher-DallasLASER/STENT PLACEMENT;  Surgeon: BNickie Retort MD;  [facility]  Service: Urology;  Laterality: Bilateral;  . Holmium laser application Bilateral 40/17/4944   Procedure: HOLMIUM LASER APPLICATION;  Surgeon: BNickie Retort MD;  [facility]  Service: Urology;  Laterality: Bilateral;    Current Outpatient Prescriptions  Medication Sig Dispense Refill  . amLODipine (NORVASC) 5 MG tablet Take 1 tablet (5 mg total) by mouth daily. (Patient taking differently: Take 5 mg by mouth every morning. ) 30 tablet 1  . diphenoxylate-atropine (LOMOTIL) 2.5-0.025 MG per tablet Take 1 tablet by mouth 4 (four) times daily as needed for diarrhea or loose stools. 60 tablet 1  . metoprolol succinate (TOPROL-XL) 25 MG 24 hr tablet Take 1 tablet (25 mg total) by mouth daily. NEEDS APPOINTMENT FOR FUTURE REFILLS. (Patient taking differently: Take 25 mg by mouth every morning. NEEDS APPOINTMENT FOR FUTURE REFILLS.) 30 tablet 1   No current facility-administered medications for this visit.    Allergies as of  08/22/2015  . (No Active Allergies)    Family History  Problem Relation Age of Onset  . Allergic rhinitis Mother   . Breast cancer Mother   . Lung cancer Father   . Colon polyps Father   . Prostate cancer Father   . Cancer Father 15    lung ca  . Allergic rhinitis Daughter   . Allergic rhinitis Son   . Asthma Son     Social History   Social History  . Marital Status: Married    Spouse Name: N/A  . Number of Children: 3  . Years of Education: N/A   Occupational History  .  BARBER   . [employer] Other   Social History Main Topics  . Smoking status: Never Smoker   . Smokeless tobacco: Never Used  . Alcohol Use: No  . Drug Use: No  . Sexual Activity: Yes   Other Topics Concern  . Not on file   Social History Narrative   Regular exercise- No   1 Caffeine drink daily      Physical Exam: Ht 6' 2"  (1.88 m)  Wt 286 lb 8 oz (129.956 kg)  BMI 36.77 kg/m2 Constitutional: generally well-appearing Psychiatric: alert and oriented x3 Abdomen: soft, nontender, nondistended, no obvious ascites, no peritoneal signs, normal bowel sounds   Assessment and plan: 40 y.o. male with history of inflammatory bowel disease likely Crohn's disease  I last saw him about 2-1/2 years ago. He scheduled an appointment several months ago but did not show for it. He has a history of other noncompliance, no shows. He has not been on any specific Crohn's therapies in 3-4 years that I can tell. He stopped azathioprine after talking to a friend who said it caused kidney cancer. He has several loose stools a day and these may indeed be from underlying IBD. He manages these with when necessary Lomotil only. I recommended that we restage his inflammatory bowel disease with lab tests and repeat colonoscopy and look in the terminal ileum I explained to him that his question about disability is probably best answered by someone else, perhaps his primary care physician. I did explain that his underlying bowel disease does not seem serious enough to me to warrant disability since he hasn't followed up in 2-1/2 years and he is on no specific medicines for it except for Lomotil.   Casey Loffler, MD [facility] 08/22/2015, 11:03 AM

## 2015-08-22 NOTE — Patient Instructions (Addendum)
You will have labs checked today in the basement lab.  Please head down after you check out with the front desk  (cbc, cmet, esr, CRP). You will be set up for a colonoscopy to restage your bowel disease.  Please call when you are ready to schedule.   

## 2015-08-28 ENCOUNTER — Other Ambulatory Visit: Payer: Self-pay | Admitting: *Deleted

## 2015-08-28 MED ORDER — METOPROLOL SUCCINATE ER 25 MG PO TB24: 25.0000 mg | ORAL_TABLET | Freq: Every day | ORAL | Status: DC

## 2015-08-28 MED ORDER — AMLODIPINE BESYLATE 5 MG PO TABS: 5.0000 mg | ORAL_TABLET | Freq: Every day | ORAL | Status: DC

## 2015-09-11 ENCOUNTER — Encounter: Payer: Self-pay | Admitting: Internal Medicine

## 2015-09-11 ENCOUNTER — Other Ambulatory Visit (INDEPENDENT_AMBULATORY_CARE_PROVIDER_SITE_OTHER): Payer: 59

## 2015-09-11 ENCOUNTER — Ambulatory Visit (INDEPENDENT_AMBULATORY_CARE_PROVIDER_SITE_OTHER): Payer: 59 | Admitting: Internal Medicine

## 2015-09-11 VITALS — BP 128/84 | HR 76 | Wt 281.0 lb

## 2015-09-11 DIAGNOSIS — R739 Hyperglycemia, unspecified: Secondary | ICD-10-CM

## 2015-09-11 DIAGNOSIS — M545 Low back pain, unspecified: Secondary | ICD-10-CM

## 2015-09-11 DIAGNOSIS — K5010 Crohn's disease of large intestine without complications: Secondary | ICD-10-CM

## 2015-09-11 DIAGNOSIS — I10 Essential (primary) hypertension: Secondary | ICD-10-CM

## 2015-09-11 LAB — VITAMIN B12: VITAMIN B 12: 461 pg/mL (ref 211–911)

## 2015-09-11 LAB — BASIC METABOLIC PANEL
BUN: 14 mg/dL (ref 6–23)
CO2: 28 mEq/L (ref 19–32)
Calcium: 9 mg/dL (ref 8.4–10.5)
Chloride: 104 mEq/L (ref 96–112)
Creatinine, Ser: 1.01 mg/dL (ref 0.40–1.50)
GFR: 104.03 mL/min (ref >60.00)
Glucose, Bld: 96 mg/dL (ref 70–99)
POTASSIUM: 3.9 meq/L (ref 3.5–5.1)
Sodium: 139 mEq/L (ref 135–145)

## 2015-09-11 LAB — URINALYSIS
BILIRUBIN URINE: NEGATIVE
HGB URINE DIPSTICK: NEGATIVE
Ketones, ur: NEGATIVE
Leukocytes, UA: NEGATIVE
NITRITE: NEGATIVE
Specific Gravity, Urine: 1.025 (ref 1.000–1.030)
TOTAL PROTEIN, URINE-UPE24: NEGATIVE
URINE GLUCOSE: NEGATIVE
UROBILINOGEN UA: 0.2 (ref 0.0–1.0)
pH: 5.5 (ref 5.0–8.0)

## 2015-09-11 LAB — HEMOGLOBIN A1C: HEMOGLOBIN A1C: 5.9 % (ref 4.6–6.5)

## 2015-09-11 LAB — VITAMIN D 25 HYDROXY (VIT D DEFICIENCY, FRACTURES): VITD: 16.55 ng/mL — ABNORMAL LOW (ref 30.00–100.00)

## 2015-09-11 MED ORDER — ERGOCALCIFEROL 1.25 MG (50000 UT) PO CAPS: 50000.0000 [IU] | ORAL_CAPSULE | ORAL | Status: DC

## 2015-09-11 MED ORDER — AMLODIPINE BESYLATE 5 MG PO TABS: 5.0000 mg | ORAL_TABLET | Freq: Every day | ORAL | Status: DC

## 2015-09-11 MED ORDER — METOPROLOL SUCCINATE ER 25 MG PO TB24: 25.0000 mg | ORAL_TABLET | Freq: Every day | ORAL | Status: DC

## 2015-09-11 MED ORDER — DIPHENOXYLATE-ATROPINE 2.5-0.025 MG PO TABS: 1.0000 | ORAL_TABLET | Freq: Four times a day (QID) | ORAL | Status: DC | PRN

## 2015-09-11 NOTE — Assessment & Plan Note (Signed)
4/17 B kidney stones, s/p cystoscopy UA

## 2015-09-11 NOTE — Progress Notes (Signed)
Subjective:  Patient ID: Casey Kelly, male    DOB: 01-Oct-1975  Age: 40 y.o. MRN: [id number]  CC: No chief complaint on file.   HPI Casey Kelly presents for HTN C/o B kidney stone procedure (08/15/15)  - has a back pain C/o feeling dizzy attimes  Outpatient Prescriptions Prior to Visit  Medication Sig Dispense Refill  . amLODipine (NORVASC) 5 MG tablet Take 1 tablet (5 mg total) by mouth daily. 30 tablet 1  . diphenoxylate-atropine (LOMOTIL) 2.5-0.025 MG per tablet Take 1 tablet by mouth 4 (four) times daily as needed for diarrhea or loose stools. 60 tablet 1  . metoprolol succinate (TOPROL-XL) 25 MG 24 hr tablet Take 1 tablet (25 mg total) by mouth daily. NEEDS APPOINTMENT FOR FUTURE REFILLS. 30 tablet 1   No facility-administered medications prior to visit.    ROS Review of Systems  Constitutional: Negative for appetite change, fatigue and unexpected weight change.  HENT: Negative for congestion, nosebleeds, sneezing, sore throat and trouble swallowing.   Eyes: Negative for itching and visual disturbance.  Respiratory: Negative for cough.   Cardiovascular: Negative for chest pain, palpitations and leg swelling.  Gastrointestinal: Negative for nausea, diarrhea, blood in stool and abdominal distention.  Genitourinary: Positive for frequency. Negative for hematuria.  Musculoskeletal: Positive for back pain. Negative for joint swelling, gait problem and neck pain.  Skin: Negative for rash.  Neurological: Negative for dizziness, tremors, speech difficulty and weakness.  Psychiatric/Behavioral: Negative for sleep disturbance, dysphoric mood and agitation. The patient is not nervous/anxious.     Objective:  There were no vitals taken for this visit.  BP Readings from Last 3 Encounters:  08/22/15 124/74  08/15/15 159/97  08/07/15 150/101    Wt Readings from Last 3 Encounters:  08/22/15 286 lb 8 oz (129.956 kg)  08/15/15 282 lb 8 oz (128.141 kg)  08/07/15 278 lb (126.1  kg)    Physical Exam  Constitutional: He is oriented to person, place, and time. He appears well-developed. No distress.  NAD  HENT:  Mouth/Throat: Oropharynx is clear and moist.  Eyes: Conjunctivae are normal. Pupils are equal, round, and reactive to light.  Neck: Normal range of motion. No JVD present. No thyromegaly present.  Cardiovascular: Normal rate, regular rhythm, normal heart sounds and intact distal pulses.  Exam reveals no gallop and no friction rub.   No murmur heard. Pulmonary/Chest: Effort normal and breath sounds normal. No respiratory distress. He has no wheezes. He has no rales. He exhibits no tenderness.  Abdominal: Soft. Bowel sounds are normal. He exhibits no distension and no mass. There is no tenderness. There is no rebound and no guarding.  Musculoskeletal: Normal range of motion. He exhibits no edema or tenderness.  Lymphadenopathy:    He has no cervical adenopathy.  Neurological: He is alert and oriented to person, place, and time. He has normal reflexes. No cranial nerve deficit. He exhibits normal muscle tone. He displays a negative Romberg sign. Coordination and gait normal.  Skin: Skin is warm and dry. No rash noted.  Psychiatric: He has a normal mood and affect. His behavior is normal. Judgment and thought content normal.    Lab Results  Component Value Date   WBC 8.1 08/22/2015   HGB 14.3 08/22/2015   HCT 42.3 08/22/2015   PLT 404.0* 08/22/2015   GLUCOSE 126* 08/22/2015   CHOL 223* 06/20/2014   TRIG 115.0 06/20/2014   HDL 37.80* 06/20/2014   LDLDIRECT 196.0 12/08/2007   LDLCALC 162* 06/20/2014  ALT 19 08/22/2015   AST 16 08/22/2015   NA 139 08/22/2015   K 4.4 08/22/2015   CL 103 08/22/2015   CREATININE 1.11 08/22/2015   BUN 20 08/22/2015   CO2 29 08/22/2015   TSH 1.24 06/20/2014   PSA 0.28 08/30/2014   INR 0.9 10/10/2010   HGBA1C 5.9 06/20/2014    No results found.  Assessment & Plan:   There are no diagnoses linked to this  encounter. I am having Mr. Lepak maintain his diphenoxylate-atropine, metoprolol succinate, and amLODipine.  No orders of the defined types were placed in this encounter.     Follow-up: No Follow-up on file.  Walker Kehr, MD

## 2015-09-11 NOTE — Assessment & Plan Note (Signed)
Amlodipine, Toprol

## 2015-09-11 NOTE — Assessment & Plan Note (Signed)
Labs, A1c Wt loss

## 2015-09-11 NOTE — Progress Notes (Signed)
Pre visit review using our clinic review tool, if applicable. No additional management support is needed unless otherwise documented below in the visit note. 

## 2015-09-11 NOTE — Assessment & Plan Note (Signed)
Check B12, Vit D

## 2015-11-07 ENCOUNTER — Telehealth: Payer: Self-pay

## 2015-11-07 NOTE — Telephone Encounter (Signed)
Pt is requesting refill on testosterone. Do not see it on pts med list. Did take it in the past

## 2015-11-08 MED ORDER — TESTOSTERONE CYPIONATE 200 MG/ML IM SOLN: 200.0000 mg | INTRAMUSCULAR | Status: DC

## 2015-11-08 NOTE — Telephone Encounter (Signed)
Pt left msg on triage requesting refill on his Testosterone. Medication is not on med list pls advise...Casey Kelly

## 2015-11-08 NOTE — Telephone Encounter (Signed)
Ok testosterone - pls call in RTC 3 mo Th

## 2015-11-09 NOTE — Telephone Encounter (Signed)
Called refill into [facility] had to leave on pharmacy vm...Casey Kelly

## 2015-11-29 ENCOUNTER — Encounter: Payer: Self-pay | Admitting: Internal Medicine

## 2015-11-29 ENCOUNTER — Ambulatory Visit (INDEPENDENT_AMBULATORY_CARE_PROVIDER_SITE_OTHER): Payer: 59 | Admitting: Internal Medicine

## 2015-11-29 DIAGNOSIS — L819 Disorder of pigmentation, unspecified: Secondary | ICD-10-CM | POA: Diagnosis not present

## 2015-11-29 DIAGNOSIS — I872 Venous insufficiency (chronic) (peripheral): Secondary | ICD-10-CM | POA: Diagnosis not present

## 2015-11-29 DIAGNOSIS — I10 Essential (primary) hypertension: Secondary | ICD-10-CM

## 2015-11-29 MED ORDER — TADALAFIL 20 MG PO TABS: 20.0000 mg | ORAL_TABLET | Freq: Every day | ORAL | 5 refills | Status: DC | PRN

## 2015-11-29 MED ORDER — IRBESARTAN 300 MG PO TABS: 300.0000 mg | ORAL_TABLET | Freq: Every day | ORAL | 11 refills | Status: DC

## 2015-11-29 NOTE — Assessment & Plan Note (Signed)
2017 B LE - due to venous insufficiency Discussed

## 2015-11-29 NOTE — Assessment & Plan Note (Signed)
Amlodipine - d/c due to swelling, Toprol - continue Add Avapro

## 2015-11-29 NOTE — Patient Instructions (Signed)

## 2015-11-29 NOTE — Progress Notes (Signed)
Pre visit review using our clinic review tool, if applicable. No additional management support is needed unless otherwise documented below in the visit note. 

## 2015-11-29 NOTE — Progress Notes (Signed)
Subjective:  Patient ID: Casey Kelly, male    DOB: 10-Feb-1976  Age: 40 y.o. MRN: [id number]  CC: Leg Swelling (Bilateral, also Bilat LE darker in color)   HPI SKYE RODARTE presents for ankle darkening, swelling at times  Outpatient Medications Prior to Visit  Medication Sig Dispense Refill  . amLODipine (NORVASC) 5 MG tablet Take 1 tablet (5 mg total) by mouth daily. 90 tablet 3  . metoprolol succinate (TOPROL-XL) 25 MG 24 hr tablet Take 1 tablet (25 mg total) by mouth daily. 90 tablet 3  . testosterone cypionate (DEPOTESTOSTERONE CYPIONATE) 200 MG/ML injection Inject 1 mL (200 mg total) into the muscle every 14 (fourteen) days. 10 mL 5  . diphenoxylate-atropine (LOMOTIL) 2.5-0.025 MG tablet Take 1 tablet by mouth 4 (four) times daily as needed for diarrhea or loose stools. (Patient not taking: Reported on 11/29/2015) 60 tablet 1  . ergocalciferol (VITAMIN D2) 50000 units capsule Take 1 capsule (50,000 Units total) by mouth once a week. (Patient not taking: Reported on 11/29/2015) 6 capsule 0   No facility-administered medications prior to visit.     ROS Review of Systems  Constitutional: Negative for appetite change, fatigue and unexpected weight change.  HENT: Negative for congestion, nosebleeds, sneezing, sore throat and trouble swallowing.   Eyes: Negative for itching and visual disturbance.  Respiratory: Negative for cough.   Cardiovascular: Positive for leg swelling. Negative for chest pain and palpitations.  Gastrointestinal: Negative for abdominal distention, blood in stool, diarrhea and nausea.  Genitourinary: Negative for frequency and hematuria.  Musculoskeletal: Negative for back pain, gait problem, joint swelling and neck pain.  Skin: Negative for rash.  Neurological: Negative for dizziness, tremors, speech difficulty and weakness.  Psychiatric/Behavioral: Negative for agitation, dysphoric mood and sleep disturbance. The patient is not nervous/anxious.      Objective:  BP (!) 160/90   Pulse 91   Wt 284 lb (128.8 kg)   SpO2 98%   BMI 36.46 kg/m   BP Readings from Last 3 Encounters:  11/29/15 (!) 160/90  09/11/15 128/84  08/22/15 124/74    Wt Readings from Last 3 Encounters:  11/29/15 284 lb (128.8 kg)  09/11/15 281 lb (127.5 kg)  08/22/15 286 lb 8 oz (130 kg)    Physical Exam  Constitutional: He is oriented to person, place, and time. He appears well-developed. No distress.  NAD  HENT:  Mouth/Throat: Oropharynx is clear and moist.  Eyes: Conjunctivae are normal. Pupils are equal, round, and reactive to light.  Neck: Normal range of motion. No JVD present. No thyromegaly present.  Cardiovascular: Normal rate, regular rhythm, normal heart sounds and intact distal pulses.  Exam reveals no gallop and no friction rub.   No murmur heard. Pulmonary/Chest: Effort normal and breath sounds normal. No respiratory distress. He has no wheezes. He has no rales. He exhibits no tenderness.  Abdominal: Soft. Bowel sounds are normal. He exhibits no distension and no mass. There is no tenderness. There is no rebound and no guarding.  Musculoskeletal: Normal range of motion. He exhibits edema. He exhibits no tenderness.  Lymphadenopathy:    He has no cervical adenopathy.  Neurological: He is alert and oriented to person, place, and time. He has normal reflexes. No cranial nerve deficit. He exhibits normal muscle tone. He displays a negative Romberg sign. Coordination and gait normal.  Skin: Skin is warm and dry. No rash noted.  Psychiatric: He has a normal mood and affect. His behavior is normal. Judgment and thought  content normal.  trace edema B Mild darkening of ankles  Lab Results  Component Value Date   WBC 8.1 08/22/2015   HGB 14.3 08/22/2015   HCT 42.3 08/22/2015   PLT 404.0 (H) 08/22/2015   GLUCOSE 96 09/11/2015   CHOL 223 (H) 06/20/2014   TRIG 115.0 06/20/2014   HDL 37.80 (L) 06/20/2014   LDLDIRECT 196.0 12/08/2007   LDLCALC  162 (H) 06/20/2014   ALT 19 08/22/2015   AST 16 08/22/2015   NA 139 09/11/2015   K 3.9 09/11/2015   CL 104 09/11/2015   CREATININE 1.01 09/11/2015   BUN 14 09/11/2015   CO2 28 09/11/2015   TSH 1.24 06/20/2014   PSA 0.28 08/30/2014   INR 0.9 10/10/2010   HGBA1C 5.9 09/11/2015    No results found.  Assessment & Plan:   There are no diagnoses linked to this encounter. I have discontinued Mr. Witherspoon's ergocalciferol. I am also having him maintain his amLODipine, diphenoxylate-atropine, metoprolol succinate, and testosterone cypionate.  No orders of the defined types were placed in this encounter.    Follow-up: No Follow-up on file.  Walker Kehr, MD

## 2015-11-29 NOTE — Assessment & Plan Note (Signed)
New D/c amlodipine; compr socks, loose wt

## 2015-12-12 ENCOUNTER — Telehealth: Payer: Self-pay | Admitting: Internal Medicine

## 2015-12-12 NOTE — Telephone Encounter (Signed)
Patient called to advise that the irbesartan (AVAPRO) 300 MG tablet [[id number]]  Is not working effectively. He states that he has been regularly reading 160/105- 160/110.

## 2015-12-13 MED ORDER — IRBESARTAN-HYDROCHLOROTHIAZIDE 300-12.5 MG PO TABS: 1.0000 | ORAL_TABLET | Freq: Every day | ORAL | 11 refills | Status: DC

## 2015-12-13 NOTE — Telephone Encounter (Signed)
Change to Avalide - Rx emailed Thx

## 2015-12-13 NOTE — Telephone Encounter (Signed)
Pt informed

## 2016-02-29 ENCOUNTER — Telehealth: Payer: Self-pay | Admitting: Internal Medicine

## 2016-02-29 NOTE — Telephone Encounter (Signed)
Left patient vm to call back if he would like to reschedule appt he cancelled for 11/6 with Plot.

## 2016-03-04 ENCOUNTER — Ambulatory Visit: Payer: 59 | Admitting: Internal Medicine

## 2016-04-14 ENCOUNTER — Telehealth: Payer: Self-pay | Admitting: Internal Medicine

## 2016-04-14 NOTE — Telephone Encounter (Signed)
sch OV Thx

## 2016-05-03 ENCOUNTER — Other Ambulatory Visit: Payer: Self-pay | Admitting: Internal Medicine

## 2016-05-03 ENCOUNTER — Ambulatory Visit: Payer: 59 | Admitting: Internal Medicine

## 2016-05-03 NOTE — Telephone Encounter (Signed)
Patient has rescheduled appt missed today for 22nd.  Would like to know if meds can be sent in?

## 2016-05-04 NOTE — Telephone Encounter (Signed)
What meds? Thx

## 2016-05-06 DIAGNOSIS — H40033 Anatomical narrow angle, bilateral: Secondary | ICD-10-CM | POA: Diagnosis not present

## 2016-05-06 DIAGNOSIS — H04123 Dry eye syndrome of bilateral lacrimal glands: Secondary | ICD-10-CM | POA: Diagnosis not present

## 2016-05-09 NOTE — Telephone Encounter (Signed)
Called refill into pharmacy had to leave on pharmacy vm...Casey Kelly

## 2016-05-20 ENCOUNTER — Ambulatory Visit (INDEPENDENT_AMBULATORY_CARE_PROVIDER_SITE_OTHER): Payer: 59 | Admitting: Internal Medicine

## 2016-05-20 ENCOUNTER — Encounter: Payer: Self-pay | Admitting: Internal Medicine

## 2016-05-20 VITALS — BP 150/90 | HR 86 | Wt 281.0 lb

## 2016-05-20 DIAGNOSIS — I10 Essential (primary) hypertension: Secondary | ICD-10-CM | POA: Diagnosis not present

## 2016-05-20 DIAGNOSIS — E291 Testicular hypofunction: Secondary | ICD-10-CM | POA: Diagnosis not present

## 2016-05-20 DIAGNOSIS — R739 Hyperglycemia, unspecified: Secondary | ICD-10-CM | POA: Diagnosis not present

## 2016-05-20 DIAGNOSIS — I872 Venous insufficiency (chronic) (peripheral): Secondary | ICD-10-CM

## 2016-05-20 MED ORDER — SACCHAROMYCES BOULARDII 250 MG PO CAPS: 250.0000 mg | ORAL_CAPSULE | Freq: Two times a day (BID) | ORAL | 0 refills | Status: DC

## 2016-05-20 MED ORDER — METRONIDAZOLE 500 MG PO TABS: 500.0000 mg | ORAL_TABLET | Freq: Three times a day (TID) | ORAL | 0 refills | Status: DC

## 2016-05-20 MED ORDER — DIPHENOXYLATE-ATROPINE 2.5-0.025 MG PO TABS: 1.0000 | ORAL_TABLET | Freq: Four times a day (QID) | ORAL | 2 refills | Status: DC | PRN

## 2016-05-20 NOTE — Assessment & Plan Note (Signed)
compr socks

## 2016-05-20 NOTE — Progress Notes (Signed)
Pre visit review using our clinic review tool, if applicable. No additional management support is needed unless otherwise documented below in the visit note. 

## 2016-05-20 NOTE — Assessment & Plan Note (Signed)
Labs

## 2016-05-20 NOTE — Assessment & Plan Note (Signed)
Increase Toprol dose

## 2016-05-20 NOTE — Progress Notes (Signed)
Subjective:  Patient ID: Casey Kelly, male    DOB: 1975-07-29  Age: 41 y.o. MRN: [id number]  CC: No chief complaint on file.   HPI   Casey Kelly presents for HTN, leg swelling on amlodipine - off x3 mo. F/u Crohn's - Dr Casey Kelly C/o mucus w/stool    Outpatient Medications Prior to Visit  Medication Sig Dispense Refill  . diphenoxylate-atropine (LOMOTIL) 2.5-0.025 MG tablet take 1 tablet by mouth four times a day if needed for diarrhea or LOOSE STOOLS 30 tablet 0  . metoprolol succinate (TOPROL-XL) 25 MG 24 hr tablet Take 1 tablet (25 mg total) by mouth daily. 90 tablet 3  . irbesartan-hydrochlorothiazide (AVALIDE) 300-12.5 MG tablet Take 1 tablet by mouth daily. (Patient not taking: Reported on 05/20/2016) 30 tablet 11  . tadalafil (CIALIS) 20 MG tablet Take 1 tablet (20 mg total) by mouth daily as needed for erectile dysfunction. 30 tablet 5  . testosterone cypionate (DEPOTESTOSTERONE CYPIONATE) 200 MG/ML injection Inject 1 mL (200 mg total) into the muscle every 14 (fourteen) days. 10 mL 5   No facility-administered medications prior to visit.     ROS Review of Systems  Constitutional: Negative for appetite change, fatigue and unexpected weight change.  HENT: Negative for congestion, nosebleeds, sneezing, sore throat and trouble swallowing.   Eyes: Negative for itching and visual disturbance.  Respiratory: Negative for cough.   Cardiovascular: Negative for chest pain, palpitations and leg swelling.  Gastrointestinal: Positive for abdominal distention, abdominal pain and diarrhea. Negative for blood in stool and nausea.  Genitourinary: Negative for frequency and hematuria.  Musculoskeletal: Negative for back pain, gait problem, joint swelling and neck pain.  Skin: Negative for rash.  Neurological: Negative for dizziness, tremors, speech difficulty and weakness.  Psychiatric/Behavioral: Negative for agitation, dysphoric mood and sleep disturbance. The patient is not  nervous/anxious.     Objective:  BP (!) 150/90   Pulse 86   Wt 281 lb (127.5 kg)   SpO2 97%   BMI 36.08 kg/m   BP Readings from Last 3 Encounters:  05/20/16 (!) 150/90  11/29/15 (!) 160/90  09/11/15 128/84    Wt Readings from Last 3 Encounters:  05/20/16 281 lb (127.5 kg)  11/29/15 284 lb (128.8 kg)  09/11/15 281 lb (127.5 kg)    Physical Exam  Constitutional: He is oriented to person, place, and time. He appears well-developed. No distress.  NAD  HENT:  Mouth/Throat: Oropharynx is clear and moist.  Eyes: Conjunctivae are normal. Pupils are equal, round, and reactive to light.  Neck: Normal range of motion. No JVD present. No thyromegaly present.  Cardiovascular: Normal rate, regular rhythm, normal heart sounds and intact distal pulses.  Exam reveals no gallop and no friction rub.   No murmur heard. Pulmonary/Chest: Effort normal and breath sounds normal. No respiratory distress. He has no wheezes. He has no rales. He exhibits no tenderness.  Abdominal: Soft. Bowel sounds are normal. He exhibits no distension and no mass. There is no tenderness. There is no rebound and no guarding.  Musculoskeletal: Normal range of motion. He exhibits no edema or tenderness.  Lymphadenopathy:    He has no cervical adenopathy.  Neurological: He is alert and oriented to person, place, and time. He has normal reflexes. No cranial nerve deficit. He exhibits normal muscle tone. He displays a negative Romberg sign. Coordination and gait normal.  Skin: Skin is warm and dry. No rash noted.  Psychiatric: He has a normal mood and affect. His  behavior is normal. Judgment and thought content normal.  hyperpigm ankles  Procedure: EKG Indication: dyslipidemia Impression: NSR. No acute changes.  Lab Results  Component Value Date   WBC 8.1 08/22/2015   HGB 14.3 08/22/2015   HCT 42.3 08/22/2015   PLT 404.0 (H) 08/22/2015   GLUCOSE 96 09/11/2015   CHOL 223 (H) 06/20/2014   TRIG 115.0 06/20/2014    HDL 37.80 (L) 06/20/2014   LDLDIRECT 196.0 12/08/2007   LDLCALC 162 (H) 06/20/2014   ALT 19 08/22/2015   AST 16 08/22/2015   NA 139 09/11/2015   K 3.9 09/11/2015   CL 104 09/11/2015   CREATININE 1.01 09/11/2015   BUN 14 09/11/2015   CO2 28 09/11/2015   TSH 1.24 06/20/2014   PSA 0.28 08/30/2014   INR 0.9 10/10/2010   HGBA1C 5.9 09/11/2015    No results found.  Assessment & Plan:   There are no diagnoses linked to this encounter. I am having Mr. Casey Kelly maintain his metoprolol succinate, testosterone cypionate, tadalafil, irbesartan-hydrochlorothiazide, diphenoxylate-atropine, and amLODipine.  Meds ordered this encounter  Medications  . amLODipine (NORVASC) 5 MG tablet    Sig: Take 1 tablet by mouth daily.     Follow-up: No Follow-up on file.  Walker Kehr, MD

## 2016-05-20 NOTE — Assessment & Plan Note (Signed)
The pt stopped inj 04/2016

## 2016-05-21 DIAGNOSIS — H578 Other specified disorders of eye and adnexa: Secondary | ICD-10-CM | POA: Diagnosis not present

## 2016-06-10 ENCOUNTER — Other Ambulatory Visit (INDEPENDENT_AMBULATORY_CARE_PROVIDER_SITE_OTHER): Payer: 59

## 2016-06-10 DIAGNOSIS — I872 Venous insufficiency (chronic) (peripheral): Secondary | ICD-10-CM | POA: Diagnosis not present

## 2016-06-10 DIAGNOSIS — R739 Hyperglycemia, unspecified: Secondary | ICD-10-CM

## 2016-06-10 DIAGNOSIS — I10 Essential (primary) hypertension: Secondary | ICD-10-CM

## 2016-06-10 DIAGNOSIS — E291 Testicular hypofunction: Secondary | ICD-10-CM

## 2016-06-10 LAB — HEPATIC FUNCTION PANEL
ALK PHOS: 87 U/L (ref 39–117)
ALT: 36 U/L (ref 0–53)
AST: 27 U/L (ref 0–37)
Albumin: 4.1 g/dL (ref 3.5–5.2)
Bilirubin, Direct: 0 mg/dL (ref 0.0–0.3)
TOTAL PROTEIN: 7.7 g/dL (ref 6.0–8.3)
Total Bilirubin: 0.4 mg/dL (ref 0.2–1.2)

## 2016-06-10 LAB — HEMOGLOBIN A1C: Hgb A1c MFr Bld: 5.6 % (ref 4.6–6.5)

## 2016-06-10 LAB — LIPID PANEL
CHOLESTEROL: 256 mg/dL — AB (ref 0–200)
HDL: 43.6 mg/dL (ref >39.00)
LDL Cholesterol: 195 mg/dL — ABNORMAL HIGH (ref 0–99)
NonHDL: 212.05
TRIGLYCERIDES: 87 mg/dL (ref 0.0–149.0)
Total CHOL/HDL Ratio: 6
VLDL: 17.4 mg/dL (ref 0.0–40.0)

## 2016-06-10 LAB — CBC WITH DIFFERENTIAL/PLATELET
BASOS PCT: 0.9 % (ref 0.0–3.0)
Basophils Absolute: 0 10*3/uL (ref 0.0–0.1)
EOS ABS: 0.1 10*3/uL (ref 0.0–0.7)
Eosinophils Relative: 1.7 % (ref 0.0–5.0)
HCT: 45.3 % (ref 39.0–52.0)
Hemoglobin: 15.2 g/dL (ref 13.0–17.0)
Lymphocytes Relative: 38.2 % (ref 12.0–46.0)
Lymphs Abs: 2 10*3/uL (ref 0.7–4.0)
MCHC: 33.6 g/dL (ref 30.0–36.0)
MCV: 83.2 fl (ref 78.0–100.0)
MONO ABS: 0.5 10*3/uL (ref 0.1–1.0)
Monocytes Relative: 9.8 % (ref 3.0–12.0)
NEUTROS ABS: 2.6 10*3/uL (ref 1.4–7.7)
NEUTROS PCT: 49.4 % (ref 43.0–77.0)
PLATELETS: 346 10*3/uL (ref 150.0–400.0)
RBC: 5.44 Mil/uL (ref 4.22–5.81)
RDW: 13.4 % (ref 11.5–15.5)
WBC: 5.3 10*3/uL (ref 4.0–10.5)

## 2016-06-10 LAB — BASIC METABOLIC PANEL
BUN: 14 mg/dL (ref 6–23)
CHLORIDE: 107 meq/L (ref 96–112)
CO2: 28 meq/L (ref 19–32)
Calcium: 9.3 mg/dL (ref 8.4–10.5)
Creatinine, Ser: 0.92 mg/dL (ref 0.40–1.50)
GFR: 115.45 mL/min (ref >60.00)
Glucose, Bld: 93 mg/dL (ref 70–99)
Potassium: 3.6 mEq/L (ref 3.5–5.1)
Sodium: 141 mEq/L (ref 135–145)

## 2016-06-10 LAB — TSH: TSH: 2.45 u[IU]/mL (ref 0.35–4.50)

## 2016-08-06 DIAGNOSIS — H578 Other specified disorders of eye and adnexa: Secondary | ICD-10-CM | POA: Diagnosis not present

## 2016-08-19 ENCOUNTER — Encounter: Payer: Self-pay | Admitting: Internal Medicine

## 2016-08-19 ENCOUNTER — Ambulatory Visit (INDEPENDENT_AMBULATORY_CARE_PROVIDER_SITE_OTHER): Payer: 59 | Admitting: Internal Medicine

## 2016-08-19 DIAGNOSIS — E291 Testicular hypofunction: Secondary | ICD-10-CM

## 2016-08-19 DIAGNOSIS — R197 Diarrhea, unspecified: Secondary | ICD-10-CM | POA: Diagnosis not present

## 2016-08-19 DIAGNOSIS — N320 Bladder-neck obstruction: Secondary | ICD-10-CM | POA: Diagnosis not present

## 2016-08-19 DIAGNOSIS — M6740 Ganglion, unspecified site: Secondary | ICD-10-CM

## 2016-08-19 DIAGNOSIS — K5010 Crohn's disease of large intestine without complications: Secondary | ICD-10-CM | POA: Diagnosis not present

## 2016-08-19 DIAGNOSIS — I10 Essential (primary) hypertension: Secondary | ICD-10-CM

## 2016-08-19 MED ORDER — TERAZOSIN HCL 1 MG PO CAPS: 1.0000 mg | ORAL_CAPSULE | Freq: Every day | ORAL | 11 refills | Status: DC

## 2016-08-19 MED ORDER — ERGOCALCIFEROL 1.25 MG (50000 UT) PO CAPS: 50000.0000 [IU] | ORAL_CAPSULE | ORAL | 3 refills | Status: DC

## 2016-08-19 MED ORDER — DIPHENOXYLATE-ATROPINE 2.5-0.025 MG PO TABS: 1.0000 | ORAL_TABLET | Freq: Four times a day (QID) | ORAL | 2 refills | Status: DC | PRN

## 2016-08-19 NOTE — Assessment & Plan Note (Signed)
F/u w/dr Ardis Hughs Lomotil - rare use Better w/Flagyl and probiotic course

## 2016-08-19 NOTE — Progress Notes (Signed)
Pre visit review using our clinic review tool, if applicable. No additional management support is needed unless otherwise documented below in the visit note. 

## 2016-08-19 NOTE — Assessment & Plan Note (Signed)
Hytrin

## 2016-08-19 NOTE — Patient Instructions (Signed)
There are natural ways to boost your testosterone:  1. Lose Weight If you're overweight, shedding the excess pounds may increase your testosterone levels, according to multiple research. Overweight men are more likely to have low testosterone levels to begin with, so this is an important trick to increase your body's testosterone production when you need it most.   2. Strength Training    Strength training is also known to boost testosterone levels, provided you are doing so intensely enough. When strength training to boost testosterone, you'll want to increase the weight and lower your number of reps, and then focus on exercises that work a large number of muscles.  3. Optimize Your Vitamin D Levels Vitamin D, a steroid hormone, is essential for the healthy development of the nucleus of the sperm cell, and helps maintain semen quality and sperm count. Vitamin D also increases levels of testosterone, which may boost libido. In one study, overweight men who were given vitamin D supplements had a significant increase in testosterone levels after one year.  4. Reduce Stress When you're under a lot of stress, your body releases high levels of the stress hormone cortisol. This hormone actually blocks the effects of testosterone, presumably because, from a biological standpoint, testosterone-associated behaviors (mating, competing, aggression) may have lowered your chances of survival in an emergency (hence, the "fight or flight" response is dominant, courtesy of cortisol).  5. Limit or Eliminate Sugar from Your Diet Testosterone levels decrease after you eat sugar, which is likely because the sugar leads to a high insulin level, another factor leading to low testosterone.  6. Eat Healthy Fats By healthy, this means not only mon- and polyunsaturated fats, like that found in avocadoes and nuts, but also saturated, as these are essential for building testosterone. Research shows that a diet with less than  40 percent of energy as fat (and that mainly from animal sources, i.e. saturated) lead to a decrease in testosterone levels.  It's important to understand that your body requires saturated fats from animal and vegetable sources (such as meat, dairy, certain oils, and tropical plants like coconut) for optimal functioning, and if you neglect this important food group in favor of sugar, grains and other starchy carbs, your health and weight are almost guaranteed to suffer. Examples of healthy fats you can eat more of to give your testosterone levels a boost include:  Olives and Olive oil  Coconuts and coconut oil Butter made from organic milk  Raw nuts, such as, almonds or pecans Eggs Avocados   Meats Palm oil Unheated organic nut oils   7. "Testosterone boosters" containing Vitamin D-3, Niacin, Vitamin B-6, Vitamin B-12, Magnesium, Zinc, Selenium, D-Aspartic Acid, Fenugreed Seed Extract, Oyst[place]hell, [place] Extract, [place] Ginseng may be helpful as well.

## 2016-08-19 NOTE — Assessment & Plan Note (Signed)
Toprol, Norvasc Added Hytrin

## 2016-08-19 NOTE — Assessment & Plan Note (Signed)
Not on Rx

## 2016-08-19 NOTE — Assessment & Plan Note (Signed)
R palmar wrist Sports med ref

## 2016-08-19 NOTE — Progress Notes (Signed)
Subjective:  Patient ID: Casey Kelly, male    DOB: 1975/09/10  Age: 41 y.o. MRN: [id number]  CC: No chief complaint on file.   HPI Casey Kelly presents for HTN, edema, urinary frequency f/u. BP OK at home C/o pain in the wrist  Outpatient Medications Prior to Visit  Medication Sig Dispense Refill  . diphenoxylate-atropine (LOMOTIL) 2.5-0.025 MG tablet Take 1 tablet by mouth 4 (four) times daily as needed for diarrhea or loose stools. 60 tablet 2  . metoprolol succinate (TOPROL-XL) 25 MG 24 hr tablet Take 1 tablet (25 mg total) by mouth daily. 90 tablet 3  . metroNIDAZOLE (FLAGYL) 500 MG tablet Take 1 tablet (500 mg total) by mouth 3 (three) times daily. 30 tablet 0  . saccharomyces boulardii (FLORASTOR) 250 MG capsule Take 1 capsule (250 mg total) by mouth 2 (two) times daily. 60 capsule 0  . irbesartan-hydrochlorothiazide (AVALIDE) 300-12.5 MG tablet Take 1 tablet by mouth daily. (Patient not taking: Reported on 05/20/2016) 30 tablet 11  . tadalafil (CIALIS) 20 MG tablet Take 1 tablet (20 mg total) by mouth daily as needed for erectile dysfunction. 30 tablet 5   No facility-administered medications prior to visit.     ROS Review of Systems  Constitutional: Negative for appetite change, fatigue and unexpected weight change.  HENT: Negative for congestion, nosebleeds, sneezing, sore throat and trouble swallowing.   Eyes: Negative for itching and visual disturbance.  Respiratory: Negative for cough.   Cardiovascular: Positive for leg swelling. Negative for chest pain and palpitations.  Gastrointestinal: Negative for abdominal distention, blood in stool, diarrhea and nausea.  Genitourinary: Negative for frequency and hematuria.  Musculoskeletal: Negative for back pain, gait problem, joint swelling and neck pain.  Skin: Negative for rash.  Neurological: Negative for dizziness, tremors, speech difficulty and weakness.  Psychiatric/Behavioral: Negative for agitation, dysphoric  mood, sleep disturbance and suicidal ideas. The patient is not nervous/anxious.     Objective:  BP (!) 142/96 (BP Location: Left Arm, Patient Position: Sitting, Cuff Size: Large)   Pulse 75   Temp 99 F (37.2 C) (Oral)   Ht 6' 3.5" (1.918 m)   Wt 280 lb (127 kg)   SpO2 99%   BMI 34.54 kg/m   BP Readings from Last 3 Encounters:  08/19/16 (!) 142/96  05/20/16 (!) 150/90  11/29/15 (!) 160/90    Wt Readings from Last 3 Encounters:  08/19/16 280 lb (127 kg)  05/20/16 281 lb (127.5 kg)  11/29/15 284 lb (128.8 kg)    Physical Exam  Constitutional: He is oriented to person, place, and time. He appears well-developed. No distress.  NAD  HENT:  Mouth/Throat: Oropharynx is clear and moist.  Eyes: Conjunctivae are normal. Pupils are equal, round, and reactive to light.  Neck: Normal range of motion. No JVD present. No thyromegaly present.  Cardiovascular: Normal rate, regular rhythm, normal heart sounds and intact distal pulses.  Exam reveals no gallop and no friction rub.   No murmur heard. Pulmonary/Chest: Effort normal and breath sounds normal. No respiratory distress. He has no wheezes. He has no rales. He exhibits no tenderness.  Abdominal: Soft. Bowel sounds are normal. He exhibits no distension and no mass. There is no tenderness. There is no rebound and no guarding.  Musculoskeletal: Normal range of motion. He exhibits edema. He exhibits no tenderness.  Lymphadenopathy:    He has no cervical adenopathy.  Neurological: He is alert and oriented to person, place, and time. He has normal reflexes.  No cranial nerve deficit. He exhibits normal muscle tone. He displays a negative Romberg sign. Coordination and gait normal.  Skin: Skin is warm and dry. No rash noted.  Psychiatric: He has a normal mood and affect. His behavior is normal. Judgment and thought content normal.  obese R palmar wrist flat ganglion? Lab Results  Component Value Date   WBC 5.3 06/10/2016   HGB 15.2  06/10/2016   HCT 45.3 06/10/2016   PLT 346.0 06/10/2016   GLUCOSE 93 06/10/2016   CHOL 256 (H) 06/10/2016   TRIG 87.0 06/10/2016   HDL 43.60 06/10/2016   LDLDIRECT 196.0 12/08/2007   LDLCALC 195 (H) 06/10/2016   ALT 36 06/10/2016   AST 27 06/10/2016   NA 141 06/10/2016   K 3.6 06/10/2016   CL 107 06/10/2016   CREATININE 0.92 06/10/2016   BUN 14 06/10/2016   CO2 28 06/10/2016   TSH 2.45 06/10/2016   PSA 0.28 08/30/2014   INR 0.9 10/10/2010   HGBA1C 5.6 06/10/2016    No results found.  Assessment & Plan:   There are no diagnoses linked to this encounter. I have discontinued Mr. Prunty's tadalafil and irbesartan-hydrochlorothiazide. I am also having him maintain his metoprolol succinate, diphenoxylate-atropine, metroNIDAZOLE, saccharomyces boulardii, and amLODipine.  Meds ordered this encounter  Medications  . amLODipine (NORVASC) 5 MG tablet     Follow-up: No Follow-up on file.  Walker Kehr, MD

## 2016-09-13 NOTE — Progress Notes (Deleted)
[facility] [place], [place] [address] Phone: [phone] Subjective:    I'm seeing this patient by the request  of:    CC: Right wrist pain.  Casey Kelly  Casey Kelly is a 41 y.o. male coming in with complaint of patient past medical history significant for Crohn's disease. Coming in with a right wrist mass. Patient states     Past Medical History:  Diagnosis Date  . Allergic rhinitis   . Bruxism   . Crohn's disease ([place])    no current med.  [name] History of chronic sinusitis   . History of kidney stones   . History of kidney stones   . Hypertension   . Hypogonadism male   . Left ureteral stone   . Mild obstructive sleep apnea    per study 10-05-2009--  INTERMITTANT CPAP USE  . Nephrolithiasis    bilateral  non-obstructive per urologist note  . Plantar fasciitis 2010   bilateral  . Wears contact lenses    Past Surgical History:  Procedure Laterality Date  . ADENOIDECTOMY  as child  . CYSTOSCOPY/URETEROSCOPY/HOLMIUM LASER/STENT PLACEMENT Bilateral 08/15/2015   Procedure: CYSTOSCOPY/ LEFT URETEROSCOPY, POSSIBLE RIGHT URETEROSCOPY [place] LASER/STENT PLACEMENT;  Surgeon: Nickie Retort, MD;  Location: [facility];  Service: Urology;  Laterality: Bilateral;  . ETHMOIDECTOMY Bilateral 07/17/2009   anterior endoscopic , polypectomy, and bilateral maxillay antrostomy  . HAND SURGERY Right    fingertip amputation  . HOLMIUM LASER APPLICATION Bilateral 1/49/7026   Procedure: HOLMIUM LASER APPLICATION;  Surgeon: Nickie Retort, MD;  Location: [facility];  Service: Urology;  Laterality: Bilateral;  . NASAL SINUS SURGERY  2016   at [place]  as child   Social History   Social History  . Marital status: Married    Spouse name: N/A  . Number of children: 3  . Years of education: N/A   Occupational History  . [employer]  . [employer]  Other   Social History Main Topics  . Smoking status: Never Smoker  . Smokeless tobacco: Never Used  . Alcohol use No  . Drug use: No  . Sexual activity: Yes   Other Topics Concern  . Not on file   Social History Narrative   Regular exercise- No   1 Caffeine drink daily    Allergies  Allergen Reactions  . Amlodipine     edema   Family History  Problem Relation Age of Onset  . Allergic rhinitis Mother   . Breast cancer Mother   . Lung cancer Father   . Colon polyps Father   . Prostate cancer Father   . Lung cancer Father 48  . Allergic rhinitis Daughter   . Allergic rhinitis Son   . Asthma Son     Past medical history, social, surgical and family history all reviewed in electronic medical record.  No pertanent information unless stated regarding to the chief complaint.   Review of Systems:Review of systems updated and as accurate as of 09/13/16  No headache, visual changes, nausea, vomiting, diarrhea, constipation, dizziness, abdominal pain, skin rash, fevers, chills, night sweats, weight loss, swollen lymph nodes, body aches, joint swelling, muscle aches, chest pain, shortness of breath, mood changes.   Objective  There were no vitals taken for this visit. Systems examined below as of 09/13/16   General: No apparent distress alert and oriented x3 mood and affect normal, dressed  appropriately.  HEENT: Pupils equal, extraocular movements intact  Respiratory: Patient's speak in full sentences and does not appear short of breath  Cardiovascular: No lower extremity edema, non tender, no erythema  Skin: Warm dry intact with no signs of infection or rash on extremities or on axial skeleton.  Abdomen: Soft nontender  Neuro: Cranial nerves II through XII are intact, neurovascularly intact in all extremities with 2+ DTRs and 2+ pulses.  Lymph: No lymphadenopathy of posterior or anterior cervical chain or axillae bilaterally.  Gait normal with good balance and coordination.    MSK:  Non tender with full range of motion and good stability and symmetric strength and tone of shoulders, elbows, hip, knee and ankles bilaterally.  Wrist: Inspection normal with no visible erythema or swelling. ROM smooth and normal with good flexion and extension and ulnar/radial deviation that is symmetrical with opposite wrist. Palpation is normal over metacarpals, navicular, lunate, and TFCC; tendons without tenderness/ swelling No snuffbox tenderness. No tenderness over Canal of [place]. Strength 5/5 in all directions without pain. Negative Finkelstein, tinel's and phalens. Negative Watson's test.   MSK US performed of: Right wrist This study was ordered, performed, and interpreted by Charlann Boxer D.O.  Wrist: All extensor compartments visualized and tendons all normal in appearance without fraying, tears, or sheath effusions. No effusion seen. TFCC intact. Scapholunate ligament intact. Carpal tunnel visualized and median nerve area normal, flexor tendons all normal in appearance without fraying, tears, or sheath effusions. Power doppler signal normal.  IMPRESSION:  NORMAL ULTRASONOGRAPHIC EXAMINATION OF THE WRIST.    Impression and Recommendations:     This case required medical decision making of moderate complexity.      Note: This dictation was prepared with Dragon dictation along with smaller phrase technology. Any transcriptional errors that result from this process are unintentional.

## 2016-09-16 ENCOUNTER — Ambulatory Visit: Payer: 59 | Admitting: Family Medicine

## 2016-10-06 ENCOUNTER — Other Ambulatory Visit: Payer: Self-pay | Admitting: Internal Medicine

## 2016-10-21 ENCOUNTER — Ambulatory Visit: Payer: 59 | Admitting: Family Medicine

## 2016-12-16 ENCOUNTER — Ambulatory Visit (INDEPENDENT_AMBULATORY_CARE_PROVIDER_SITE_OTHER): Payer: 59 | Admitting: Internal Medicine

## 2016-12-16 ENCOUNTER — Encounter: Payer: Self-pay | Admitting: Internal Medicine

## 2016-12-16 VITALS — BP 132/86 | HR 74 | Temp 98.7°F | Ht 75.5 in | Wt 281.0 lb

## 2016-12-16 DIAGNOSIS — I10 Essential (primary) hypertension: Secondary | ICD-10-CM

## 2016-12-16 DIAGNOSIS — R079 Chest pain, unspecified: Secondary | ICD-10-CM

## 2016-12-16 DIAGNOSIS — R0789 Other chest pain: Secondary | ICD-10-CM | POA: Diagnosis not present

## 2016-12-16 DIAGNOSIS — F5102 Adjustment insomnia: Secondary | ICD-10-CM | POA: Diagnosis not present

## 2016-12-16 DIAGNOSIS — F419 Anxiety disorder, unspecified: Secondary | ICD-10-CM

## 2016-12-16 DIAGNOSIS — G4700 Insomnia, unspecified: Secondary | ICD-10-CM | POA: Insufficient documentation

## 2016-12-16 MED ORDER — ZOLPIDEM TARTRATE 10 MG PO TABS: 5.0000 mg | ORAL_TABLET | Freq: Every evening | ORAL | 0 refills | Status: DC | PRN

## 2016-12-16 MED ORDER — TRAZODONE HCL 50 MG PO TABS: 25.0000 mg | ORAL_TABLET | Freq: Every day | ORAL | 3 refills | Status: DC

## 2016-12-16 MED ORDER — RANITIDINE HCL 300 MG PO TABS: 300.0000 mg | ORAL_TABLET | Freq: Every day | ORAL | 11 refills | Status: DC

## 2016-12-16 NOTE — Assessment & Plan Note (Signed)
EKG - OK ASA 81 mg/d Stress test

## 2016-12-16 NOTE — Assessment & Plan Note (Signed)
Trazodone qhs

## 2016-12-16 NOTE — Assessment & Plan Note (Signed)
Amlodipine, Toprol, Hytrin

## 2016-12-16 NOTE — Progress Notes (Signed)
Subjective:  Patient ID: Casey Kelly, male    DOB: [birth year]  Age: 41 y.o. MRN: [id number]  CC: No chief complaint on file.   HPI JAYDEE CONRAN presents for HTN, GERD, diarrhea - stopped f/u C/o chest discomfort related to GERD C/o anxiety, insomnia...  Outpatient Medications Prior to Visit  Medication Sig Dispense Refill  . amLODipine (NORVASC) 5 MG tablet take 1 tablet by mouth once daily 90 tablet 2  . diphenoxylate-atropine (LOMOTIL) 2.5-0.025 MG tablet Take 1 tablet by mouth 4 (four) times daily as needed for diarrhea or loose stools. 60 tablet 2  . metoprolol succinate (TOPROL-XL) 25 MG 24 hr tablet take 1 tablet by mouth once daily 90 tablet 2  . metroNIDAZOLE (FLAGYL) 500 MG tablet Take 1 tablet (500 mg total) by mouth 3 (three) times daily. 30 tablet 0  . saccharomyces boulardii (FLORASTOR) 250 MG capsule Take 1 capsule (250 mg total) by mouth 2 (two) times daily. 60 capsule 0  . ergocalciferol (VITAMIN D2) 50000 units capsule Take 1 capsule (50,000 Units total) by mouth every 30 (thirty) days. 3 capsule 3  . terazosin (HYTRIN) 1 MG capsule Take 1 capsule (1 mg total) by mouth at bedtime. (Patient not taking: Reported on 12/16/2016) 30 capsule 11   No facility-administered medications prior to visit.     ROS Review of Systems  Constitutional: Negative for appetite change, fatigue and unexpected weight change.  HENT: Negative for congestion, nosebleeds, sneezing, sore throat and trouble swallowing.   Eyes: Negative for itching and visual disturbance.  Respiratory: Negative for cough.   Cardiovascular: Positive for chest pain. Negative for palpitations and leg swelling.  Gastrointestinal: Negative for abdominal distention, blood in stool, diarrhea and nausea.  Genitourinary: Negative for frequency and hematuria.  Musculoskeletal: Negative for back pain, gait problem, joint swelling and neck pain.  Skin: Negative for rash.  Neurological: Negative for dizziness, tremors,  speech difficulty and weakness.  Psychiatric/Behavioral: Positive for sleep disturbance. Negative for agitation, dysphoric mood and suicidal ideas. The patient is nervous/anxious.     Objective:  BP 132/86 (BP Location: Left Arm, Patient Position: Sitting, Cuff Size: Large)   Pulse 74   Temp 98.7 F (37.1 C) (Oral)   Ht 6' 3.5" (1.918 m)   Wt 281 lb (127.5 kg)   SpO2 99%   BMI 34.66 kg/m   BP Readings from Last 3 Encounters:  12/16/16 132/86  08/19/16 (!) 142/96  05/20/16 (!) 150/90    Wt Readings from Last 3 Encounters:  12/16/16 281 lb (127.5 kg)  08/19/16 280 lb (127 kg)  05/20/16 281 lb (127.5 kg)    Physical Exam  Constitutional: He is oriented to person, place, and time. He appears well-developed. No distress.  NAD  HENT:  Mouth/Throat: Oropharynx is clear and moist.  Eyes: Pupils are equal, round, and reactive to light. Conjunctivae are normal.  Neck: Normal range of motion. No JVD present. No thyromegaly present.  Cardiovascular: Normal rate, regular rhythm, normal heart sounds and intact distal pulses.  Exam reveals no gallop and no friction rub.   No murmur heard. Pulmonary/Chest: Effort normal and breath sounds normal. No respiratory distress. He has no wheezes. He has no rales. He exhibits no tenderness.  Abdominal: Soft. Bowel sounds are normal. He exhibits no distension and no mass. There is no tenderness. There is no rebound and no guarding.  Musculoskeletal: Normal range of motion. He exhibits no edema or tenderness.  Lymphadenopathy:    He has no cervical  adenopathy.  Neurological: He is alert and oriented to person, place, and time. He has normal reflexes. No cranial nerve deficit. He exhibits normal muscle tone. He displays a negative Romberg sign. Coordination and gait normal.  Skin: Skin is warm and dry. No rash noted.  Psychiatric: He has a normal mood and affect. His behavior is normal. Judgment and thought content normal.  obese  Procedure:  EKG Indication: chest pain Impression: NSR. No acute changes.   Lab Results  Component Value Date   WBC 5.3 06/10/2016   HGB 15.2 06/10/2016   HCT 45.3 06/10/2016   PLT 346.0 06/10/2016   GLUCOSE 93 06/10/2016   CHOL 256 (H) 06/10/2016   TRIG 87.0 06/10/2016   HDL 43.60 06/10/2016   LDLDIRECT 196.0 12/08/2007   LDLCALC 195 (H) 06/10/2016   ALT 36 06/10/2016   AST 27 06/10/2016   NA 141 06/10/2016   K 3.6 06/10/2016   CL 107 06/10/2016   CREATININE 0.92 06/10/2016   BUN 14 06/10/2016   CO2 28 06/10/2016   TSH 2.45 06/10/2016   PSA 0.28 08/30/2014   INR 0.9 10/10/2010   HGBA1C 5.6 06/10/2016    No results found.  Assessment & Plan:   There are no diagnoses linked to this encounter. I have discontinued Mr. [place]'s ergocalciferol. I am also having him maintain his metroNIDAZOLE, saccharomyces boulardii, terazosin, diphenoxylate-atropine, metoprolol succinate, amLODipine, and Cholecalciferol (VITAMIN D PO).  Meds ordered this encounter  Medications  . Cholecalciferol (VITAMIN D PO)    Sig: Take by mouth.     Follow-up: No Follow-up on file.  Walker Kehr, MD

## 2016-12-16 NOTE — Assessment & Plan Note (Signed)
Trazodone qhs Zolpidem prn - rare

## 2017-03-17 ENCOUNTER — Ambulatory Visit: Payer: 59 | Admitting: Internal Medicine

## 2017-03-25 ENCOUNTER — Ambulatory Visit: Payer: 59 | Admitting: Internal Medicine

## 2017-03-31 ENCOUNTER — Ambulatory Visit: Payer: Self-pay | Admitting: *Deleted

## 2017-03-31 NOTE — Telephone Encounter (Addendum)
Patient is concerned about all the recalls. He would like to discuss changing his blood pressure medication. He is concerned about cancer causing agents.  Discussed reasons for recalls and patient will follow up with his pharmacy for verification of medication safety. Patient made appointment for his 3 month follow up.

## 2017-04-14 ENCOUNTER — Telehealth: Payer: Self-pay | Admitting: Internal Medicine

## 2017-04-14 NOTE — Telephone Encounter (Signed)
Copied from [place] [id number]. Topic: Quick Communication - See Telephone Encounter >> Apr 14, 2017  3:16 PM Synthia Innocent wrote: CRM for notification. See Telephone encounter for:  Patient states Ambien is not working, please advise Needing refill on  metoprolol succinate (TOPROL-XL) 25 MG 24 hr tablet and amLODipine (NORVASC) 5 MG tablet [facility] on [address] 04/14/17.

## 2017-04-15 ENCOUNTER — Other Ambulatory Visit: Payer: Self-pay

## 2017-04-15 NOTE — Telephone Encounter (Signed)
Pt called and notified to contact pharmacy for refill, prescription is showing one refill is still available.   Pt is also stating Ambien is not working.

## 2017-04-21 ENCOUNTER — Ambulatory Visit: Payer: 59 | Admitting: Internal Medicine

## 2017-05-12 ENCOUNTER — Encounter: Payer: Self-pay | Admitting: Gastroenterology

## 2017-05-19 ENCOUNTER — Ambulatory Visit: Payer: 59 | Admitting: Internal Medicine

## 2017-05-21 ENCOUNTER — Ambulatory Visit: Payer: 59 | Admitting: Internal Medicine

## 2017-05-21 ENCOUNTER — Other Ambulatory Visit (INDEPENDENT_AMBULATORY_CARE_PROVIDER_SITE_OTHER): Payer: 59

## 2017-05-21 ENCOUNTER — Encounter: Payer: Self-pay | Admitting: Internal Medicine

## 2017-05-21 DIAGNOSIS — E785 Hyperlipidemia, unspecified: Secondary | ICD-10-CM | POA: Diagnosis not present

## 2017-05-21 DIAGNOSIS — F5102 Adjustment insomnia: Secondary | ICD-10-CM

## 2017-05-21 DIAGNOSIS — R739 Hyperglycemia, unspecified: Secondary | ICD-10-CM

## 2017-05-21 DIAGNOSIS — I10 Essential (primary) hypertension: Secondary | ICD-10-CM | POA: Diagnosis not present

## 2017-05-21 LAB — HEPATIC FUNCTION PANEL
ALBUMIN: 4 g/dL (ref 3.5–5.2)
ALT: 27 U/L (ref 0–53)
AST: 41 U/L — ABNORMAL HIGH (ref 0–37)
Alkaline Phosphatase: 82 U/L (ref 39–117)
Bilirubin, Direct: 0.1 mg/dL (ref 0.0–0.3)
Total Bilirubin: 0.6 mg/dL (ref 0.2–1.2)
Total Protein: 7.9 g/dL (ref 6.0–8.3)

## 2017-05-21 LAB — BASIC METABOLIC PANEL
BUN: 12 mg/dL (ref 6–23)
CHLORIDE: 105 meq/L (ref 96–112)
CO2: 25 mEq/L (ref 19–32)
Calcium: 9.2 mg/dL (ref 8.4–10.5)
Creatinine, Ser: 1.06 mg/dL (ref 0.40–1.50)
GFR: 97.61 mL/min (ref >60.00)
Glucose, Bld: 92 mg/dL (ref 70–99)
POTASSIUM: 4 meq/L (ref 3.5–5.1)
Sodium: 141 mEq/L (ref 135–145)

## 2017-05-21 LAB — LIPID PANEL
CHOL/HDL RATIO: 5
Cholesterol: 213 mg/dL — ABNORMAL HIGH (ref 0–200)
HDL: 39.8 mg/dL (ref >39.00)
LDL CALC: 157 mg/dL — AB (ref 0–99)
NONHDL: 172.97
Triglycerides: 80 mg/dL (ref 0.0–149.0)
VLDL: 16 mg/dL (ref 0.0–40.0)

## 2017-05-21 LAB — HEMOGLOBIN A1C: HEMOGLOBIN A1C: 5.8 % (ref 4.6–6.5)

## 2017-05-21 MED ORDER — ZOLPIDEM TARTRATE 10 MG PO TABS: 5.0000 mg | ORAL_TABLET | Freq: Every evening | ORAL | 1 refills | Status: DC | PRN

## 2017-05-21 MED ORDER — TERAZOSIN HCL 1 MG PO CAPS: 1.0000 mg | ORAL_CAPSULE | Freq: Every day | ORAL | 3 refills | Status: DC

## 2017-05-21 MED ORDER — METOPROLOL SUCCINATE ER 25 MG PO TB24: 25.0000 mg | ORAL_TABLET | Freq: Every day | ORAL | 3 refills | Status: DC

## 2017-05-21 MED ORDER — AMLODIPINE BESYLATE 5 MG PO TABS: 5.0000 mg | ORAL_TABLET | Freq: Every day | ORAL | 3 refills | Status: DC

## 2017-05-21 NOTE — Progress Notes (Signed)
Subjective:  Patient ID: Casey Kelly, male    DOB: 1975/12/28  Age: 42 y.o. MRN: [id number]  CC: No chief complaint on file.   HPI Casey Kelly presents for HTN, insomnia, BPH f/u  Outpatient Medications Prior to Visit  Medication Sig Dispense Refill  . amLODipine (NORVASC) 5 MG tablet take 1 tablet by mouth once daily 90 tablet 2  . Cholecalciferol (VITAMIN D PO) Take by mouth.    . metoprolol succinate (TOPROL-XL) 25 MG 24 hr tablet take 1 tablet by mouth once daily 90 tablet 2  . ranitidine (ZANTAC) 300 MG tablet Take 1 tablet (300 mg total) by mouth at bedtime. 30 tablet 11  . terazosin (HYTRIN) 1 MG capsule Take 1 capsule (1 mg total) by mouth at bedtime. 30 capsule 11  . traZODone (DESYREL) 50 MG tablet Take 0.5-1 tablets (25-50 mg total) by mouth at bedtime. 30 tablet 3  . zolpidem (AMBIEN) 10 MG tablet Take 0.5-1 tablets (5-10 mg total) by mouth at bedtime as needed for sleep. 90 tablet 0   No facility-administered medications prior to visit.     ROS Review of Systems  Constitutional: Negative for appetite change, fatigue and unexpected weight change.  HENT: Negative for congestion, nosebleeds, sneezing, sore throat and trouble swallowing.   Eyes: Negative for itching and visual disturbance.  Respiratory: Negative for cough.   Cardiovascular: Negative for chest pain, palpitations and leg swelling.  Gastrointestinal: Negative for abdominal distention, blood in stool, diarrhea and nausea.  Genitourinary: Negative for frequency and hematuria.  Musculoskeletal: Negative for back pain, gait problem, joint swelling and neck pain.  Skin: Negative for rash.  Neurological: Negative for dizziness, tremors, speech difficulty and weakness.  Psychiatric/Behavioral: Negative for agitation, dysphoric mood and sleep disturbance. The patient is not nervous/anxious.     Objective:  BP (!) 146/92 (BP Location: Left Arm, Patient Position: Sitting, Cuff Size: Large)   Pulse 76    Temp 98.4 F (36.9 C) (Oral)   Ht 6' 3.5" (1.918 m)   Wt 284 lb (128.8 kg)   SpO2 99%   BMI 35.03 kg/m   BP Readings from Last 3 Encounters:  05/21/17 (!) 146/92  12/16/16 132/86  08/19/16 (!) 142/96    Wt Readings from Last 3 Encounters:  05/21/17 284 lb (128.8 kg)  12/16/16 281 lb (127.5 kg)  08/19/16 280 lb (127 kg)    Physical Exam  Constitutional: He is oriented to person, place, and time. He appears well-developed. No distress.  NAD  HENT:  Mouth/Throat: Oropharynx is clear and moist.  Eyes: Conjunctivae are normal. Pupils are equal, round, and reactive to light.  Neck: Normal range of motion. No JVD present. No thyromegaly present.  Cardiovascular: Normal rate, regular rhythm, normal heart sounds and intact distal pulses. Exam reveals no gallop and no friction rub.  No murmur heard. Pulmonary/Chest: Effort normal and breath sounds normal. No respiratory distress. He has no wheezes. He has no rales. He exhibits no tenderness.  Abdominal: Soft. Bowel sounds are normal. He exhibits no distension and no mass. There is no tenderness. There is no rebound and no guarding.  Musculoskeletal: Normal range of motion. He exhibits no edema or tenderness.  Lymphadenopathy:    He has no cervical adenopathy.  Neurological: He is alert and oriented to person, place, and time. He has normal reflexes. No cranial nerve deficit. He exhibits normal muscle tone. He displays a negative Romberg sign. Coordination and gait normal.  Skin: Skin is warm and  dry. No rash noted.  Psychiatric: He has a normal mood and affect. His behavior is normal. Judgment and thought content normal.    Lab Results  Component Value Date   WBC 5.3 06/10/2016   HGB 15.2 06/10/2016   HCT 45.3 06/10/2016   PLT 346.0 06/10/2016   GLUCOSE 93 06/10/2016   CHOL 256 (H) 06/10/2016   TRIG 87.0 06/10/2016   HDL 43.60 06/10/2016   LDLDIRECT 196.0 12/08/2007   LDLCALC 195 (H) 06/10/2016   ALT 36 06/10/2016   AST 27  06/10/2016   NA 141 06/10/2016   K 3.6 06/10/2016   CL 107 06/10/2016   CREATININE 0.92 06/10/2016   BUN 14 06/10/2016   CO2 28 06/10/2016   TSH 2.45 06/10/2016   PSA 0.28 08/30/2014   INR 0.9 10/10/2010   HGBA1C 5.6 06/10/2016    No results found.  Assessment & Plan:   There are no diagnoses linked to this encounter. I am having Casey Kelly maintain his terazosin, metoprolol succinate, amLODipine, Cholecalciferol (VITAMIN D PO), zolpidem, ranitidine, and traZODone.  No orders of the defined types were placed in this encounter.    Follow-up: No Follow-up on file.  Walker Kehr, MD

## 2017-05-21 NOTE — Assessment & Plan Note (Signed)
Labs

## 2017-05-21 NOTE — Assessment & Plan Note (Signed)
Amlodipine, Toprol,  Hytrin

## 2017-05-21 NOTE — Assessment & Plan Note (Signed)
Loose wt

## 2017-05-21 NOTE — Assessment & Plan Note (Signed)
Trazodone qhs

## 2017-05-26 ENCOUNTER — Ambulatory Visit: Payer: 59 | Admitting: Internal Medicine

## 2017-06-09 ENCOUNTER — Other Ambulatory Visit: Payer: Self-pay

## 2017-06-09 ENCOUNTER — Ambulatory Visit (AMBULATORY_SURGERY_CENTER): Payer: Self-pay | Admitting: *Deleted

## 2017-06-09 VITALS — Ht 75.5 in | Wt 282.0 lb

## 2017-06-09 DIAGNOSIS — K639 Disease of intestine, unspecified: Secondary | ICD-10-CM

## 2017-06-09 MED ORDER — PEG 3350-KCL-NA BICARB-NACL 420 G PO SOLR: 4000.0000 mL | Freq: Once | ORAL | 0 refills | Status: AC

## 2017-06-09 NOTE — Progress Notes (Signed)
Patient denies any allergies to eggs or soy. Patient denies any problems with anesthesia/sedation. Patient denies any oxygen use at home. Patient denies taking any diet/weight loss medications or blood thinners. EMMI education assisgned to patient on colonoscopy, this was explained and instructions given to patient. 

## 2017-06-10 ENCOUNTER — Encounter: Payer: Self-pay | Admitting: Gastroenterology

## 2017-06-19 ENCOUNTER — Telehealth: Payer: Self-pay | Admitting: Gastroenterology

## 2017-06-19 NOTE — Telephone Encounter (Signed)
[place], thanks.  No charge.

## 2017-06-23 ENCOUNTER — Encounter: Payer: 59 | Admitting: Gastroenterology

## 2017-07-30 ENCOUNTER — Encounter: Payer: 59 | Admitting: Gastroenterology

## 2017-08-11 ENCOUNTER — Encounter: Payer: Self-pay | Admitting: *Deleted

## 2017-08-11 ENCOUNTER — Encounter: Payer: Self-pay | Admitting: Gastroenterology

## 2017-08-12 ENCOUNTER — Ambulatory Visit (AMBULATORY_SURGERY_CENTER): Payer: 59 | Admitting: Gastroenterology

## 2017-08-12 ENCOUNTER — Other Ambulatory Visit: Payer: Self-pay

## 2017-08-12 ENCOUNTER — Encounter: Payer: Self-pay | Admitting: Gastroenterology

## 2017-08-12 VITALS — BP 149/91 | HR 71 | Temp 98.9°F | Resp 12 | Ht 75.0 in | Wt 282.0 lb

## 2017-08-12 DIAGNOSIS — K639 Disease of intestine, unspecified: Secondary | ICD-10-CM

## 2017-08-12 DIAGNOSIS — Z1211 Encounter for screening for malignant neoplasm of colon: Secondary | ICD-10-CM | POA: Diagnosis present

## 2017-08-12 DIAGNOSIS — Z1212 Encounter for screening for malignant neoplasm of rectum: Secondary | ICD-10-CM | POA: Diagnosis not present

## 2017-08-12 DIAGNOSIS — Z8719 Personal history of other diseases of the digestive system: Secondary | ICD-10-CM | POA: Diagnosis not present

## 2017-08-12 DIAGNOSIS — K529 Noninfective gastroenteritis and colitis, unspecified: Secondary | ICD-10-CM | POA: Diagnosis not present

## 2017-08-12 DIAGNOSIS — K5090 Crohn's disease, unspecified, without complications: Secondary | ICD-10-CM | POA: Diagnosis not present

## 2017-08-12 MED ORDER — SODIUM CHLORIDE 0.9 % IV SOLN: 500.0000 mL | INTRAVENOUS | Status: DC

## 2017-08-12 NOTE — Op Note (Signed)
[facility] Patient Name: Casey Kelly Procedure Date: 08/12/2017 11:20 AM MRN: [id number] Endoscopist: Milus Banister , MD Age: 42 Referring MD:  Date of Birth: 1976/01/05 Gender: Male Account #: [id number] Procedure:                Colonoscopy Indications:              High risk colon cancer surveillance: Crohn's small                            and large intestine; Crohn's disease, diagnosed                            September 2012 when he presented with intermittent                            rectal bleeding, loose stools. Colonoscopy                            September 2012 showed 20 cm segment in his left                            colon with chronic, also active colitis on biopsy.                            Terminal ileum found active ileitis as well on                            biopsy. TPMT enzyme level was normal. Small bowel                            follow-through was also normal. 01/2011 he was                            started on azathiaprine 286m per day, then no                            -showed for three appts from 2012 until 03/2013. He                            had been off all crohn's meds for 1-2 year by time                            of 03/2013 follow up, I started him on antispasm .                            2014ROV scheduled for several weeks later but he                            never showed for that appt. 07/2015 ROV off all                            bowel meds except lomotil for 2 1/2 years,  recommended colonoscopy to restage bowel disease.                            At time of this examination he says he's only                            taking lomotil, periodically Medicines:                Monitored Anesthesia Care Procedure:                Pre-Anesthesia Assessment:                           - Prior to the procedure, a History and Physical                            was performed, and patient medications  and                            allergies were reviewed. The patient's tolerance of                            previous anesthesia was also reviewed. The risks                            and benefits of the procedure and the sedation                            options and risks were discussed with the patient.                            All questions were answered, and informed consent                            was obtained. Prior Anticoagulants: The patient has                            taken no previous anticoagulant or antiplatelet                            agents. ASA Grade Assessment: II - A patient with                            mild systemic disease. After reviewing the risks                            and benefits, the patient was deemed in                            satisfactory condition to undergo the procedure.                           After obtaining informed consent, the colonoscope  was passed under direct vision. Throughout the                            procedure, the patient's blood pressure, pulse, and                            oxygen saturations were monitored continuously. The                            Colonoscope was introduced through the anus and                            advanced to the the terminal ileum. The colonoscopy                            was performed without difficulty. The patient                            tolerated the procedure well. The quality of the                            bowel preparation was good. The terminal ileum,                            ileocecal valve, appendiceal orifice, and rectum                            were photographed. Scope In: 11:25:37 AM Scope Out: 11:40:48 AM Scope Withdrawal Time: 0 hours 13 minutes 44 seconds  Total Procedure Duration: 0 hours 15 minutes 11 seconds  Findings:                 IC valve was slightly inflammed, externally. The                            most terminal  ileum mucosa was also slightly                            irregular, congested appearing. Biopsies were taken                            with a cold forceps for histology. (jar 1)                           There was a 20cm segment of the colon in sigmoid                            region which was congested appearing, slightly                            inflammed. This was in a region of a few                            diverticulum. Biopsies taken (jar 4)  The colon was otherwise normal. Biopsies taken from                            right colon (jar 2), normal areas in left colon                            (jar 3), and rectum (jar 5). Complications:            No immediate complications. Estimated blood loss:                            None. Estimated Blood Loss:     Estimated blood loss: none. Impression:               - Irregular terminal ileum and segment of sigmoid                            colon. This is similar to the examination 2012.                            Multiple biopsies taken. Recommendation:           - Patient has a contact number available for                            emergencies. The signs and symptoms of potential                            delayed complications were discussed with the                            patient. Return to normal activities tomorrow.                            Written discharge instructions were provided to the                            patient.                           - Resume previous diet.                           - Continue present medications.                           - Await pathology results. Milus Banister, MD 08/12/2017 11:53:44 AM This report has been signed electronically.

## 2017-08-12 NOTE — Progress Notes (Signed)
Spontaneous respirations throughout. VSS. Resting comfortably. To PACU on room air. Report to  RN. 

## 2017-08-12 NOTE — Progress Notes (Signed)
Called to room to assist during endoscopic procedure.  Patient ID and intended procedure confirmed with present staff. Received instructions for my participation in the procedure from the performing physician.  

## 2017-08-12 NOTE — Patient Instructions (Signed)
YOU HAD AN ENDOSCOPIC PROCEDURE TODAY AT [facility]:   Refer to the procedure report that was given to you for any specific questions about what was found during the examination.  If the procedure report does not answer your questions, please call your gastroenterologist to clarify.  If you requested that your care partner not be given the details of your procedure findings, then the procedure report has been included in a sealed envelope for you to review at your convenience later.  YOU SHOULD EXPECT: Some feelings of bloating in the abdomen. Passage of more gas than usual.  Walking can help get rid of the air that was put into your GI tract during the procedure and reduce the bloating. If you had a lower endoscopy (such as a colonoscopy or flexible sigmoidoscopy) you may notice spotting of blood in your stool or on the toilet paper. If you underwent a bowel prep for your procedure, you may not have a normal bowel movement for a few days.  Please Note:  You might notice some irritation and congestion in your nose or some drainage.  This is from the oxygen used during your procedure.  There is no need for concern and it should clear up in a day or so.  SYMPTOMS TO REPORT IMMEDIATELY:   Following lower endoscopy (colonoscopy or flexible sigmoidoscopy):  Excessive amounts of blood in the stool  Significant tenderness or worsening of abdominal pains  Swelling of the abdomen that is new, acute  Fever of 100F or higher   For urgent or emergent issues, a gastroenterologist can be reached at any hour by calling [phone].   DIET:  We do recommend a small meal at first, but then you may proceed to your regular diet.  Drink plenty of fluids but you should avoid alcoholic beverages for 24 hours.  ACTIVITY:  You should plan to take it easy for the rest of today and you should NOT DRIVE or use heavy machinery until tomorrow (because of the sedation medicines used during the test).     FOLLOW UP: Our staff will call the number listed on your records the next business day following your procedure to check on you and address any questions or concerns that you may have regarding the information given to you following your procedure. If we do not reach you, we will leave a message.  However, if you are feeling well and you are not experiencing any problems, there is no need to return our call.  We will assume that you have returned to your regular daily activities without incident.  If any biopsies were taken you will be contacted by phone or by letter within the next 1-3 weeks.  Please call us at [phone] if you have not heard about the biopsies in 3 weeks.    SIGNATURES/CONFIDENTIALITY: You and/or your care partner have signed paperwork which will be entered into your electronic medical record.  These signatures attest to the fact that that the information above on your After Visit Summary has been reviewed and is understood.  Full responsibility of the confidentiality of this discharge information lies with you and/or your care-partner.

## 2017-08-13 ENCOUNTER — Telehealth: Payer: Self-pay

## 2017-08-13 NOTE — Telephone Encounter (Signed)
  Follow up Call-  Call back number 08/12/2017  Post procedure Call Back phone  # [phone]  Permission to leave phone message Yes  Some recent data might be hidden     Patient questions:  Do you have a fever, pain , or abdominal swelling? No. Pain Score  0 *  Have you tolerated food without any problems? Yes.    Have you been able to return to your normal activities? Yes.    Do you have any questions about your discharge instructions: Diet   No. Medications  No. Follow up visit  No.  Do you have questions or concerns about your Care? No.  Actions: * If pain score is 4 or above: No action needed, pain <4.  No problems noted per pt. maw

## 2017-08-19 ENCOUNTER — Encounter: Payer: Self-pay | Admitting: Gastroenterology

## 2017-10-01 DIAGNOSIS — N200 Calculus of kidney: Secondary | ICD-10-CM | POA: Diagnosis not present

## 2017-10-01 DIAGNOSIS — R1084 Generalized abdominal pain: Secondary | ICD-10-CM | POA: Diagnosis not present

## 2017-10-13 ENCOUNTER — Other Ambulatory Visit (INDEPENDENT_AMBULATORY_CARE_PROVIDER_SITE_OTHER): Payer: 59

## 2017-10-13 ENCOUNTER — Ambulatory Visit: Payer: 59 | Admitting: Family

## 2017-10-13 ENCOUNTER — Encounter: Payer: Self-pay | Admitting: Family

## 2017-10-13 ENCOUNTER — Ambulatory Visit (INDEPENDENT_AMBULATORY_CARE_PROVIDER_SITE_OTHER)
Admission: RE | Admit: 2017-10-13 | Discharge: 2017-10-13 | Disposition: A | Discharge status: Home / Self Care | Payer: 59 | Source: Ambulatory Visit | Attending: Family | Admitting: Family

## 2017-10-13 VITALS — BP 140/90 | HR 80 | Temp 98.0°F | Ht 75.0 in | Wt 278.0 lb

## 2017-10-13 DIAGNOSIS — R109 Unspecified abdominal pain: Secondary | ICD-10-CM

## 2017-10-13 DIAGNOSIS — N200 Calculus of kidney: Secondary | ICD-10-CM | POA: Diagnosis not present

## 2017-10-13 DIAGNOSIS — Z125 Encounter for screening for malignant neoplasm of prostate: Secondary | ICD-10-CM

## 2017-10-13 DIAGNOSIS — M546 Pain in thoracic spine: Secondary | ICD-10-CM | POA: Diagnosis not present

## 2017-10-13 DIAGNOSIS — M545 Low back pain: Secondary | ICD-10-CM | POA: Diagnosis not present

## 2017-10-13 LAB — CBC WITH DIFFERENTIAL/PLATELET
Basophils Absolute: 0.1 10*3/uL (ref 0.0–0.1)
Basophils Relative: 0.8 % (ref 0.0–3.0)
EOS ABS: 0.3 10*3/uL (ref 0.0–0.7)
Eosinophils Relative: 4 % (ref 0.0–5.0)
HCT: 43.9 % (ref 39.0–52.0)
HEMOGLOBIN: 14.7 g/dL (ref 13.0–17.0)
Lymphocytes Relative: 31.3 % (ref 12.0–46.0)
Lymphs Abs: 2.2 10*3/uL (ref 0.7–4.0)
MCHC: 33.6 g/dL (ref 30.0–36.0)
MCV: 82.6 fl (ref 78.0–100.0)
MONO ABS: 0.8 10*3/uL (ref 0.1–1.0)
Monocytes Relative: 11.3 % (ref 3.0–12.0)
Neutro Abs: 3.6 10*3/uL (ref 1.4–7.7)
Neutrophils Relative %: 52.6 % (ref 43.0–77.0)
Platelets: 359 10*3/uL (ref 150.0–400.0)
RBC: 5.31 Mil/uL (ref 4.22–5.81)
RDW: 13.8 % (ref 11.5–15.5)
WBC: 6.9 10*3/uL (ref 4.0–10.5)

## 2017-10-13 LAB — COMPREHENSIVE METABOLIC PANEL
ALK PHOS: 78 U/L (ref 39–117)
ALT: 24 U/L (ref 0–53)
AST: 17 U/L (ref 0–37)
Albumin: 4.1 g/dL (ref 3.5–5.2)
BUN: 11 mg/dL (ref 6–23)
CO2: 26 mEq/L (ref 19–32)
CREATININE: 0.91 mg/dL (ref 0.40–1.50)
Calcium: 9.3 mg/dL (ref 8.4–10.5)
Chloride: 105 mEq/L (ref 96–112)
GFR: 116.19 mL/min (ref >60.00)
Glucose, Bld: 96 mg/dL (ref 70–99)
Potassium: 4.1 mEq/L (ref 3.5–5.1)
SODIUM: 139 meq/L (ref 135–145)
TOTAL PROTEIN: 7.3 g/dL (ref 6.0–8.3)
Total Bilirubin: 0.3 mg/dL (ref 0.2–1.2)

## 2017-10-13 LAB — PSA: PSA: 0.23 ng/mL (ref 0.10–4.00)

## 2017-10-13 NOTE — Progress Notes (Signed)
Casey Kelly is a 42 y.o. male with the following history as recorded in EpicCare:  Patient Active Problem List   Diagnosis Date Noted  . Dyslipidemia 05/21/2017  . Midsternal chest pain 12/16/2016  . Anxiety 12/16/2016  . Insomnia 12/16/2016  . 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 Ganglion cyst 08/19/2016  . Chronic venous insufficiency 11/29/2015  . Discoloration of skin 11/29/2015  . Left flank pain 07/04/2015  . Low back pain 07/04/2015  . Diarrhea 12/19/2014  . Chronic right maxillary sinusitis 09/17/2014  . Hypertrophy of both inferior nasal turbinates 09/17/2014  . Hematuria 08/30/2014  . Well adult exam 06/20/2014  . Mucous retention cyst of maxillary sinus 04/11/2014  . Nasal obstruction 04/11/2014  . Rhinitis medicamentosa 04/11/2014  . Sinusitis, chronic 05/26/2013  . Medical non-compliance 03/29/2013  . Right testicular pain 02/23/2013  . Right LBP 08/17/2012  . Chest wall pain 05/19/2012  . Nasal polyp 04/08/2011  . Crohn disease ([place]) 02/13/2011  . Hypogonadism male 12/05/2010  . Hyperglycemia 12/05/2010  . 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 SEBORRHEIC DERMATITIS 11/13/2009  . BRUXISM 10/16/2009  . OBSTRUCTIVE SLEEP APNEA 10/16/2009  . WART, VIRAL 09/04/2009  . PALPITATIONS 09/04/2009  . ACUTE SUPPURATIVE OTITIS MEDIA DZ CLASS ELSW 12/06/2008  . [place] 12/06/2008  . NEOPLASM OF UNCERTAIN BEHAVIOR OF SKIN 12/08/2007  . Essential hypertension 10/26/2007  . ALLERGIC RHINITIS 10/26/2007    Current Outpatient Medications  Medication Sig Dispense Refill  . amLODipine (NORVASC) 5 MG tablet Take 1 tablet (5 mg total) by mouth daily. 90 tablet 3  . Cholecalciferol (VITAMIN D PO) Take by mouth.    . cyclobenzaprine (FLEXERIL) 5 MG tablet     . metoprolol succinate (TOPROL-XL) 25 MG 24 hr tablet Take 1 tablet (25 mg total) by mouth daily. 90 tablet 3  . zolpidem (AMBIEN) 10 MG tablet Take 0.5-1 tablets (5-10 mg total) by mouth at bedtime  as needed for sleep. 90 tablet 1   Current Facility-Administered Medications  Medication Dose Route Frequency Provider Last Rate Last Dose  . 0.9 %  sodium chloride infusion  500 mL Intravenous Continuous Milus Banister, MD        Allergies: Amlodipine  Past Medical History:  Diagnosis Date  . Allergic rhinitis   . Bruxism   . Crohn's disease ([place])    no current med.  [name] History of chronic sinusitis   . History of kidney stones   . History of kidney stones   . Hypertension   . Hypogonadism male   . Left ureteral stone   . Mild obstructive sleep apnea    per study 10-05-2009--  INTERMITTANT CPAP USE  . Nephrolithiasis    bilateral  non-obstructive per urologist note  . Plantar fasciitis 2010   bilateral  . Sleep apnea    CPAP use  . Wears contact lenses     Past Surgical History:  Procedure Laterality Date  . ADENOIDECTOMY  as child  . COLONOSCOPY  2012  . CYSTOSCOPY/URETEROSCOPY/HOLMIUM LASER/STENT PLACEMENT Bilateral 08/15/2015   Procedure: CYSTOSCOPY/ LEFT URETEROSCOPY, POSSIBLE RIGHT URETEROSCOPY [place] LASER/STENT PLACEMENT;  Surgeon: Nickie Retort, MD;  Location: [facility];  Service: Urology;  Laterality: Bilateral;  . ETHMOIDECTOMY Bilateral 07/17/2009   anterior endoscopic , polypectomy, and bilateral maxillay antrostomy  . HAND SURGERY Right    fingertip amputation  . HOLMIUM LASER APPLICATION Bilateral 6/81/2751   Procedure: HOLMIUM LASER APPLICATION;  Surgeon: Nickie Retort, MD;  Location: [facility];  Service: Urology;  Laterality: Bilateral;  . NASAL SINUS SURGERY  2016   at [place]  as child    Family History  Problem Relation Age of Onset  . Allergic rhinitis Mother   . Breast cancer Mother   . Lung cancer Father 55  . Colon polyps Father   . Prostate cancer Father   . Allergic rhinitis Daughter   . Allergic rhinitis Son   . Asthma Son   . Colon cancer Neg Hx     Social  History   Tobacco Use  . Smoking status: Never Smoker  . Smokeless tobacco: Never Used  Substance Use Topics  . Alcohol use: No    Subjective:  Right sided flank pain "on and off" x 2-3 months; saw his urologist again today with the concerns- had CT this morning which showed 3 kidney stones; urologist did not think stone was the problem and thought it was muscular in nature; was given prescription for Flexeril today by his urologist- has not picked up the medication; patient is adamant that pain is not related to his back- is in fact due to the kidney stone and he is only here because his urologist told him he had to be seen here; patient is a difficult historian- he has not tried the medication given by his urologist today for a muscular source; suspect he misunderstood what the urologist was asking him to do and try the muscle relaxer before coming here today; no GI symptoms- colonoscopy done last month and no concerning changes seen.    Objective:  Vitals:   10/13/17 1327  BP: 140/90  Pulse: 80  Temp: 98 F (36.7 C)  TempSrc: Oral  SpO2: 97%  Weight: 278 lb (126.1 kg)  Height: 6' 3"  (1.905 m)    General: Well developed, well nourished, in no acute distress  Skin : Warm and dry.  Head: Normocephalic and atraumatic  Lungs: Respirations unlabored; clear to auscultation bilaterally without wheeze, rales, rhonchi  CVS exam: normal rate and regular rhythm.  Musculoskeletal: No deformities; no active joint inflammation  Extremities: No edema, cyanosis, clubbing  Vessels: Symmetric bilaterally  Neurologic: Alert and oriented; speech intact; face symmetrical; moves all extremities well; CNII-XII intact without focal deficit   Assessment:  1. Flank pain   2. Prostate cancer screening     Plan:  1. Patient has been seen by his urologist today and was told to try Flexeril to see if symptoms improved; will update thoracic and lumbar X-ray today; agree with patient's plan to see his  urologist in follow-up if no benefit with the Flexeril; stressed need to quit drinking soda to help limit kidney stones; 2. Check PSA today;   No follow-ups on file.  Orders Placed This Encounter  Procedures  . DG Thoracic Spine 2 View    Standing Status:   Future    Standing Expiration Date:   12/14/2018    Order Specific Question:   Reason for Exam (SYMPTOM  OR DIAGNOSIS REQUIRED)    Answer:   back pain    Order Specific Question:   Preferred imaging location?    Answer:   [facility]    Order Specific Question:   Radiology Contrast Protocol - do NOT remove file path    Answer:   \\[email or web address]s.pdf  . DG Lumbar Spine 2-3 Views    Standing Status:   Future    Standing Expiration Date:   12/14/2018    Order Specific Question:  Reason for Exam (SYMPTOM  OR DIAGNOSIS REQUIRED)    Answer:   back pain    Order Specific Question:   Preferred imaging location?    Answer:   [facility]    Order Specific Question:   Radiology Contrast Protocol - do NOT remove file path    Answer:   \\[email or web address]s.pdf  . CBC w/Diff    Standing Status:   Future    Standing Expiration Date:   10/13/2018  . Comp Met (CMET)    Standing Status:   Future    Standing Expiration Date:   10/13/2018  . PSA    Standing Status:   Future    Standing Expiration Date:   10/13/2018    Requested Prescriptions    No prescriptions requested or ordered in this encounter

## 2017-10-20 ENCOUNTER — Encounter: Payer: Self-pay | Admitting: Family

## 2017-10-28 ENCOUNTER — Other Ambulatory Visit: Payer: Self-pay | Admitting: Urology

## 2017-10-28 ENCOUNTER — Other Ambulatory Visit: Payer: Self-pay

## 2017-10-28 ENCOUNTER — Encounter (HOSPITAL_COMMUNITY): Payer: Self-pay | Admitting: *Deleted

## 2017-10-28 ENCOUNTER — Telehealth: Payer: Self-pay | Admitting: *Deleted

## 2017-10-28 DIAGNOSIS — N200 Calculus of kidney: Secondary | ICD-10-CM | POA: Diagnosis not present

## 2017-10-28 NOTE — Telephone Encounter (Signed)
Copied from [place] [id number]. Topic: Quick Communication - See Telephone Encounter >> Oct 28, 2017  9:13 AM Marja Kays F wrote: Pt os needing to talk with Dr. Alain Marion about possible changing the medication metoprolol he states he is having side effects of the medication it is effecting his man hood   Best number [phone]    Called pt inform he will need to see MD before med can be change. Pt is set-up to see MD on 11/17/17 for he is CPX. Inform t can move appt if he want too since he had other issues. Pt requesting appt to be moved up. Moved appt to 11/04/17 @ 10:40.Marland KitchenJohny Chess

## 2017-11-03 ENCOUNTER — Ambulatory Visit (HOSPITAL_COMMUNITY): Admission: RE | Admit: 2017-11-03 | Payer: 59 | Source: Ambulatory Visit | Admitting: Urology

## 2017-11-03 SURGERY — LITHOTRIPSY, ESWL
Anesthesia: LOCAL | Laterality: Right

## 2017-11-04 ENCOUNTER — Encounter: Payer: 59 | Admitting: Internal Medicine

## 2017-11-04 ENCOUNTER — Other Ambulatory Visit: Payer: Self-pay | Admitting: Urology

## 2017-11-13 ENCOUNTER — Encounter (HOSPITAL_COMMUNITY): Admission: RE | Payer: Self-pay | Source: Ambulatory Visit

## 2017-11-13 ENCOUNTER — Ambulatory Visit (HOSPITAL_COMMUNITY): Admission: RE | Admit: 2017-11-13 | Payer: 59 | Source: Ambulatory Visit | Admitting: Urology

## 2017-11-13 SURGERY — LITHOTRIPSY, ESWL
Anesthesia: LOCAL | Laterality: Right

## 2017-11-17 ENCOUNTER — Encounter: Payer: 59 | Admitting: Internal Medicine

## 2017-11-24 ENCOUNTER — Other Ambulatory Visit: Payer: Self-pay | Admitting: *Deleted

## 2017-11-26 MED ORDER — DIPHENOXYLATE-ATROPINE 2.5-0.025 MG PO TABS: 1.0000 | ORAL_TABLET | Freq: Four times a day (QID) | ORAL | 1 refills | Status: DC | PRN

## 2017-12-01 ENCOUNTER — Telehealth: Payer: Self-pay | Admitting: Gastroenterology

## 2017-12-01 NOTE — Telephone Encounter (Signed)
I spoke to patient who was questioning if Dr Ardis Hughs ordered any new medication for him after his colonoscopy 4/19. No new medications were ordered at that time. Patient complains of blood in his stool over the past month. Appointment made for 12/03/17 at 8:30am with Janett Billow.

## 2017-12-03 ENCOUNTER — Encounter: Payer: Self-pay | Admitting: Gastroenterology

## 2017-12-03 ENCOUNTER — Ambulatory Visit: Payer: 59 | Admitting: Gastroenterology

## 2017-12-03 VITALS — BP 124/88 | HR 80 | Ht 75.5 in | Wt 285.6 lb

## 2017-12-03 DIAGNOSIS — K625 Hemorrhage of anus and rectum: Secondary | ICD-10-CM | POA: Diagnosis not present

## 2017-12-03 DIAGNOSIS — R197 Diarrhea, unspecified: Secondary | ICD-10-CM

## 2017-12-03 DIAGNOSIS — K5010 Crohn's disease of large intestine without complications: Secondary | ICD-10-CM

## 2017-12-03 DIAGNOSIS — R109 Unspecified abdominal pain: Secondary | ICD-10-CM | POA: Diagnosis not present

## 2017-12-03 MED ORDER — MESALAMINE ER 0.375 G PO CP24: 1500.0000 mg | ORAL_CAPSULE | Freq: Every day | ORAL | 3 refills | Status: DC

## 2017-12-03 MED ORDER — DIPHENOXYLATE-ATROPINE 2.5-0.025 MG PO TABS: 1.0000 | ORAL_TABLET | Freq: Four times a day (QID) | ORAL | 1 refills | Status: DC | PRN

## 2017-12-03 NOTE — Progress Notes (Signed)
12/03/2017 Casey Kelly [id number] 1976-02-16  Review of pertinent gastrointestinal problems:   1. Crohn's disease, diagnosed September 2012 when he presented with intermittent rectal bleeding, loose stools. Colonoscopy September 2012 showed 20 cm segment in his left colon with chronic, also active colitis on biopsy. Terminal ileum found active ileitis as well on biopsy. TPMT enzyme level was normal. Small bowel follow-through was also normal.  01/2011 he was started on azathiaprine 268m per day, then no -showed for three appts from 2012 until 03/2013. He had been off all crohn's meds for 1-2 year by time of 03/2013 follow up, I started him on antispasm meds, ROV scheduled for several weeks later but he never showed for that appt.  02/2015 appt set up but he no showed again.  HISTORY OF PRESENT ILLNESS: He finally underwent a repeat colonoscopy in 07/2017 by Dr. JArdis Hughsthat showed the following:  Irregular terminal ileum and segment of sigmoid colon. This is similar to the examination 2012. Multiple biopsies taken.  Terminal ileal biopsies were normal.  The only biopsies that   were abnormal were from the sigmoid colon and showed minimally active chronic colitis c/w IBD.  Complains of seeing blood in his stools for the past 1.5 months that started intermittently but has progressed to every BM and also sees mucus in his stools.  No rectal pain.  Has intermittent diarrhea as well, but sometimes normal stools.  Complaining of right sided abdominal pain for the past 3 months.  Thought it was kidney stone, but that was "rule out".  SMichela Pitcherit could be a muscle and he says that tylenol actually does help.  Complains of a lot of gas/flatulence.  Past Medical History:  Diagnosis Date  . Allergic rhinitis   . Bruxism   . [place]    no current med.  .Marland KitchenHistory of chronic sinusitis   . History of kidney stones   . History of kidney stones   . Hypertension   . Hypogonadism male   .  Left ureteral stone   . Mild obstructive sleep apnea    per study 10-05-2009--  INTERMITTANT CPAP USE  . Nephrolithiasis    bilateral  non-obstructive per urologist note  . Plantar fasciitis 2010   bilateral  . Sleep apnea    CPAP use  . Wears contact lenses    Past Surgical History:  Procedure Laterality Date  . ADENOIDECTOMY  as child  . COLONOSCOPY  2012  . CYSTOSCOPY/URETEROSCOPY/HOLMIUM LASER/STENT PLACEMENT Bilateral 08/15/2015   Procedure: CYSTOSCOPY/ LEFT URETEROSCOPY, POSSIBLE RIGHT URETEROSCOPY [place]/STENT PLAC[place];  Surgeon: BNickie Retort MD;  [facility]  Service: Urology;  Laterality: Bilateral;  . ETHMOIDECTOMY Bilateral 07/17/2009   anterior endoscopic , polypectomy, and bilateral maxillay antrostomy  . HAND SURGERY Right    fingertip amputation  . HOLMIUM LASER APPLICATION Bilateral 43/97/6734  Procedure: HOLMIUM LASER APPLICATION;  Surgeon: BNickie Retort MD;  [facility]  Service: Urology;  Laterality: Bilateral;  . [place] S[place]    reports that he has never smoked. He has never used smokeless tobacco. He reports that he does not drink alcohol or use drugs. family history includes Allergic rhinitis in his daughter, mother, and son; Asthma in his son; Breast cancer in his mother; Colon polyps in his father; Lung cancer (age of onset: 672 in his father; Prostate cancer in  his father. Allergies  Allergen Reactions  . Amlodipine     edema      Outpatient Encounter Medications as of 12/03/2017  Medication Sig  . amLODipine (NORVASC) 5 MG tablet Take 1 tablet (5 mg total) by mouth daily.  . Cholecalciferol (VITAMIN D PO) Take by mouth.  . diphenoxylate-atropine (LOMOTIL) 2.5-0.025 MG tablet Take 1 tablet by mouth 4 (four) times daily as needed for diarrhea or loose stools.  . metoprolol succinate (TOPROL-XL) 25 MG 24 hr tablet Take  1 tablet (25 mg total) by mouth daily.  . [DISCONTINUED] cyclobenzaprine (FLEXERIL) 5 MG tablet   . [DISCONTINUED] zolpidem (AMBIEN) 10 MG tablet Take 0.5-1 tablets (5-10 mg total) by mouth at bedtime as needed for sleep.   Facility-Administered Encounter Medications as of 12/03/2017  Medication  . 0.9 %  sodium chloride infusion     REVIEW OF SYSTEMS  : All other systems reviewed and negative except where noted in the History of Present Illness.   PHYSICAL EXAM: BP 124/88   Pulse 80   Ht 6' 3.5" (1.918 m)   Wt 285 lb 9.6 oz (129.5 kg)   BMI 35.23 kg/m  General: Well developed black male in no acute distress Head: Normocephalic and atraumatic Eyes:  Sclerae anicteric, conjunctiva pink. Ears: Normal auditory acuity Lungs: Clear throughout to auscultation; no increased WOB. Heart:  Regular rate and rhythm.  No M/R/G. Abdomen:  Soft, non-distended.  BS present.  Non-tender. Musculoskeletal: Symmetrical with no gross deformities  Skin: No lesions on visible extremities Extremities: No edema  Neurological: Alert oriented x 4, grossly non-focal Psychological:  Alert and cooperative. Normal mood and affect  ASSESSMENT AND PLAN: *42 year old male with IBD, likely crohn's disease:  Had inflammation at TI in the past but most recent colonoscopy showed normal TI and only inflammation was minimally active in the sigmoid colon.  Patient has been non-compliant over the years and has not really been on any medications for his Crohn's disease.  I am going to start him on Apriso 4 pills daily (Lialda was not covered).  Will refill his lomotil.  Can start a daily probiotic.  I am going to schedule an MR enterography in order to evaluate his small bowel.  Although his right sided abdominal pain may very well be muscular, I think that we need to make sure it is not from small bowel disease because if so then mesalamine would not be a good choice of medication for him and he would likely need escalated  therapy with something like Remicade.   CC:  Plotnikov, Evie Lacks, MD

## 2017-12-03 NOTE — Patient Instructions (Signed)
We have sent the following medications to your pharmacy for you to pick up at your convenience: Apriso 4 pills daily   We have refilled your Lomotil.   Start a daily probiotic.   You have been scheduled for an MRI at [facility] on 12-10-17 . Your appointment time is 4 pm. Please arrive 1 hour prior to your appointment time for registration purposes. Please make certain not to have anything to eat or drink 4 hours prior to your test. In addition, if you have any metal in your body, have a pacemaker or defibrillator, please be sure to let your ordering physician know. This test typically takes 45 minutes to 1 hour to complete. Should you need to reschedule, please call [phone] to do so.

## 2017-12-04 ENCOUNTER — Encounter: Payer: Self-pay | Admitting: Gastroenterology

## 2017-12-04 DIAGNOSIS — L219 Seborrheic dermatitis, unspecified: Secondary | ICD-10-CM | POA: Diagnosis not present

## 2017-12-04 DIAGNOSIS — K625 Hemorrhage of anus and rectum: Secondary | ICD-10-CM | POA: Insufficient documentation

## 2017-12-05 NOTE — Progress Notes (Signed)
I agree with the above note, plan 

## 2017-12-10 ENCOUNTER — Ambulatory Visit (HOSPITAL_COMMUNITY): Admission: RE | Admit: 2017-12-10 | Payer: 59 | Source: Ambulatory Visit

## 2017-12-18 ENCOUNTER — Ambulatory Visit (HOSPITAL_COMMUNITY)
Admission: RE | Admit: 2017-12-18 | Discharge: 2017-12-18 | Disposition: A | Discharge status: Home / Self Care | Payer: 59 | Source: Ambulatory Visit | Attending: Gastroenterology | Admitting: Gastroenterology

## 2017-12-18 DIAGNOSIS — R197 Diarrhea, unspecified: Secondary | ICD-10-CM

## 2017-12-18 DIAGNOSIS — K50811 Crohn's disease of both small and large intestine with rectal bleeding: Secondary | ICD-10-CM | POA: Insufficient documentation

## 2017-12-18 DIAGNOSIS — R109 Unspecified abdominal pain: Secondary | ICD-10-CM

## 2017-12-18 DIAGNOSIS — K625 Hemorrhage of anus and rectum: Secondary | ICD-10-CM

## 2017-12-18 DIAGNOSIS — K5010 Crohn's disease of large intestine without complications: Secondary | ICD-10-CM

## 2017-12-18 DIAGNOSIS — K5000 Crohn's disease of small intestine without complications: Secondary | ICD-10-CM | POA: Diagnosis not present

## 2017-12-18 LAB — POCT I-STAT CREATININE: CREATININE: 1.1 mg/dL (ref 0.61–1.24)

## 2017-12-18 MED ORDER — GADOBENATE DIMEGLUMINE 529 MG/ML IV SOLN
20.0000 mL | Freq: Once | INTRAVENOUS | Status: AC | PRN
  Administered 2017-12-18: 20 mL via INTRAVENOUS

## 2017-12-22 ENCOUNTER — Other Ambulatory Visit: Payer: Self-pay

## 2017-12-22 DIAGNOSIS — K5010 Crohn's disease of large intestine without complications: Secondary | ICD-10-CM

## 2017-12-22 DIAGNOSIS — K625 Hemorrhage of anus and rectum: Secondary | ICD-10-CM

## 2017-12-22 DIAGNOSIS — R109 Unspecified abdominal pain: Secondary | ICD-10-CM

## 2017-12-22 DIAGNOSIS — R197 Diarrhea, unspecified: Secondary | ICD-10-CM

## 2017-12-22 DIAGNOSIS — K639 Disease of intestine, unspecified: Secondary | ICD-10-CM

## 2017-12-31 ENCOUNTER — Ambulatory Visit: Payer: 59 | Admitting: Gastroenterology

## 2017-12-31 ENCOUNTER — Encounter: Payer: Self-pay | Admitting: Gastroenterology

## 2017-12-31 VITALS — BP 124/78 | HR 84 | Ht 74.0 in | Wt 285.0 lb

## 2017-12-31 DIAGNOSIS — R1011 Right upper quadrant pain: Secondary | ICD-10-CM | POA: Diagnosis not present

## 2017-12-31 NOTE — Patient Instructions (Addendum)
Abdominal US for RUQ pains  You have been scheduled for an abdominal ultrasound at [facility]iology (1st floor of hospital) on 01/07/18 at 1130am. Please arrive 15 minutes prior to your appointment for registration. Make certain not to have anything to eat or drink 6 hours prior to your appointment. Should you need to reschedule your appointment, please contact radiology at [phone]. This test typically takes about 30 minutes to perform.  Normal BMI (Body Mass Index- based on height and weight) is between 19 and 25. Your BMI today is Body mass index is 36.59 kg/m. [name] Please consider follow up  regarding your BMI with your Primary Care Provider.  [name]

## 2017-12-31 NOTE — Progress Notes (Signed)
Review of pertinent gastrointestinal problems:  1. Crohn's disease, diagnosed September 2012 when he presented with intermittent rectal bleeding, loose stools. Colonoscopy September 2012 showed 20 cm segment in his left colon with chronic, also active colitis on biopsy. Terminal ileum found active ileitis as well on biopsy. TPMT enzyme level was normal. Small bowel follow-through was also normal. 01/2011 he was started on azathiaprine 263m per day, then no -showed for three appts from 2012 until 03/2013. He had been off all crohn's meds for 1-2 year by time of 03/2013 follow up, I started him on antispasm meds, ROV scheduled for several weeks later but he never showed for that appt.  02/2015 appt set up but he no showed again. 07/2015 office visit he had been off of all Crohn's medicines for 2-1/2 years.  I recommended a colonoscopy to restage his bowel disease.  He eventually showed up for the colonoscopy 2 years later.  Colonoscopy April 2019 (similar to 2012 colonoscopy) found irregular terminal ileum and segment of sigmoid colon.  These areas were slightly inflamed appearing congested.  Multiple biopsies were taken from the ileum and the colon. TI biopsies were normal,  Sigmoid biopsies showed minimally active chronic colitis.   HPI: This is a very pleasant 42year old man whom I last saw at the time of a colonoscopy about 5 months ago.  See that results summarized above.  Chief complaint is intermittent right upper quadrant pains  He was here in our office about 2 months ago and saw JJanett Billow He was having abdominal pains and loose, blood stools.  She started apriso full dose, lomotil refills, probiotic.   She arranged for an MRI enterography: 11/2017: Mild active terminal ileitis. Short segments of mild active Crohn colitis in the cecum, proximal sigmoid colon and rectosigmoid junction. No evidence of fistula, abscess or obstruction.  Has pain in RUQ, can be fine for days. Then intermittent RUQ  pains.  His pains seem like kidney stones in the past.  He does not feel much better since starting the a pre-so  BMs and eating do NOT help the pains.  He's been gaining weight (up 15 poundsn in a year).  ROS: complete GI ROS as described in HPI, all other review negative.  Constitutional:  No unintentional weight loss   Past Medical History:  Diagnosis Date  . Allergic rhinitis   . Bruxism   . [place]    no current med.  .Marland KitchenHistory of chronic sinusitis   . History of kidney stones   . History of kidney stones   . Hypertension   . Hypogonadism male   . Left ureteral stone   . Mild obstructive sleep apnea    per study 10-05-2009--  INTERMITTANT CPAP USE  . Nephrolithiasis    bilateral  non-obstructive per urologist note  . Plantar fasciitis 2010   bilateral  . Sleep apnea    CPAP use  . Wears contact lenses     Past Surgical History:  Procedure Laterality Date  . ADENOIDECTOMY  as child  . COLONOSCOPY  2012  . CYSTOSCOPY/URETEROSCOPY/HOLMIUM LASER/STENT PLACEMENT Bilateral 08/15/2015   Procedure: CYSTOSCOPY/ LEFT URETEROSCOPY, POSSIBLE RIGHT URETEROSCOPY /[place]/STENT PLACEMENT;  Surgeon: BNickie Retort MD;  [facility]  Service: Urology;  Laterality: Bilateral;  . ETHMOIDECTOMY Bilateral 07/17/2009   anterior endoscopic , polypectomy, and bilateral maxillay antrostomy  . HAND SURGERY Right    fingertip amputation  . HOLMIUM LASER APPLICATION Bilateral 48/02/314  Procedure: HOLMIUM LASER  APPLICATION;  Surgeon: Nickie Retort, MD;  [facility];  Service: Urology;  Laterality: Bilateral;  . NASAL SINUS SURGERY  2016   at [place]  as child    Current Outpatient Medications  Medication Sig Dispense Refill  . amLODipine (NORVASC) 5 MG tablet Take 1 tablet (5 mg total) by mouth daily. 90 tablet 3  . Cholecalciferol (VITAMIN D PO) Take by mouth.    .  diphenoxylate-atropine (LOMOTIL) 2.5-0.025 MG tablet Take 1 tablet by mouth 4 (four) times daily as needed for diarrhea or loose stools. 60 tablet 1  . mesalamine (APRISO) 0.375 g 24 hr capsule Take 4 capsules (1.5 g total) by mouth daily. 120 capsule 3  . metoprolol succinate (TOPROL-XL) 25 MG 24 hr tablet Take 1 tablet (25 mg total) by mouth daily. 90 tablet 3   Current Facility-Administered Medications  Medication Dose Route Frequency Provider Last Rate Last Dose  . 0.9 %  sodium chloride infusion  500 mL Intravenous Continuous Milus Banister, MD        Allergies as of 12/31/2017 - Review Complete 12/31/2017  Allergen Reaction Noted  . Amlodipine  11/29/2015    Family History  Problem Relation Age of Onset  . Allergic rhinitis Mother   . Breast cancer Mother   . Lung cancer Father 65  . Colon polyps Father   . Prostate cancer Father   . Allergic rhinitis Daughter   . Allergic rhinitis Son   . Asthma Son   . Colon cancer Neg Hx     Social History   Socioeconomic History  . Marital status: Married    Spouse name: Not on file  . Number of children: 3  . Years of education: Not on file  . Highest education level: Not on file  Occupational History  . Occupation: Metallurgist: Proofreader  . Occupation: English as a second language teacher: [employer]  . Financial resource strain: Not on file  . Food insecurity:    Worry: Not on file    Inability: Not on file  . Transportation needs:    Medical: Not on file    Non-medical: Not on file  Tobacco Use  . Smoking status: Never Smoker  . Smokeless tobacco: Never Used  Substance and Sexual Activity  . Alcohol use: No  . Drug use: No  . Sexual activity: Yes  Lifestyle  . Physical activity:    Days per week: Not on file    Minutes per session: Not on file  . Stress: Not on file  Relationships  . Social connections:    Talks on phone: Not on file    Gets together: Not on file    Attends religious  service: Not on file    Active member of club or organization: Not on file    Attends meetings of clubs or organizations: Not on file    Relationship status: Not on file  . Intimate partner violence:    Fear of current or ex partner: Not on file    Emotionally abused: Not on file    Physically abused: Not on file    Forced sexual activity: Not on file  Other Topics Concern  . Not on file  Social History Narrative   Regular exercise- No   1 Caffeine drink daily      Physical Exam: BP 124/78   Pulse 84   Ht 6' 2"  (  1.88 m)   Wt 285 lb (129.3 kg)   BMI 36.59 kg/m  Constitutional: generally well-appearing Psychiatric: alert and oriented x3 Abdomen: soft, nontender, nondistended, no obvious ascites, no peritoneal signs, normal bowel sounds No peripheral edema noted in lower extremities  Assessment and plan: 42 y.o. male with Crohn's ileocolitis  I am not convinced that the intermittent right sided, really upper quadrant abdominal pains are related to his mild Crohn's ileocolitis.  There is some mild inflammation by MR enterography and endoscopically in his terminal ileum, path biopsies were normal however.  He can have absolutely no discomfort for many days and then for 2 or 3 days be bothered by colicky right sided, right upper quadrant abdominal pain.  He says this reminds him of kidney stone pain however he is seeing his urologist even just a month ago and they do not think his pains are related to kidney stone disease.  Perhaps he has gallbladder problems, biliary colic or gallstone disease.  Been order right upper quadrant ultrasound.  I think if that fails to show obvious explanation for his intermittent right upper quadrant pains and I would likely try a brief trial of steroids.  I am reluctant to put him on any stronger inflammatory bowel disease such as immunomodulators or Biologics without more clear evidence that his symptoms are related to IBD.  Also he has a well-documented  history of noncompliance making both of those therapies also a bit more challenging, risky.  He will stay on his full strength oral mesalamine for now.  Please see the "Patient Instructions" section for addition details about the plan.  Owens Loffler, MD [facility] 12/31/2017, 2:02 PM

## 2018-01-02 ENCOUNTER — Telehealth: Payer: Self-pay | Admitting: Internal Medicine

## 2018-01-02 NOTE — Telephone Encounter (Signed)
I'm not familiar with this patch. Thx

## 2018-01-02 NOTE — Telephone Encounter (Signed)
Does he want patient to schedule an appointment to talk about it?

## 2018-01-02 NOTE — Telephone Encounter (Signed)
Copied from [place] [phone]. Topic: General - Other >> Jan 02, 2018  9:25 AM Lennox Solders wrote: Reason for CRM: pt is calling and would like to try the blood pressure patch that you put on your foot a night. Pt does not know the name of patch. Pt has not check with his insurance  [facility] on [address]

## 2018-01-07 ENCOUNTER — Ambulatory Visit (HOSPITAL_COMMUNITY)
Admission: RE | Admit: 2018-01-07 | Discharge: 2018-01-07 | Disposition: A | Discharge status: Home / Self Care | Payer: 59 | Source: Ambulatory Visit | Attending: Gastroenterology | Admitting: Gastroenterology

## 2018-01-07 DIAGNOSIS — R1011 Right upper quadrant pain: Secondary | ICD-10-CM | POA: Diagnosis not present

## 2018-01-07 DIAGNOSIS — N200 Calculus of kidney: Secondary | ICD-10-CM | POA: Diagnosis not present

## 2018-01-07 NOTE — Telephone Encounter (Signed)
Pt notified and made f/u appt

## 2018-01-14 ENCOUNTER — Other Ambulatory Visit: Payer: Self-pay

## 2018-01-14 MED ORDER — PREDNISONE 10 MG PO TABS: ORAL_TABLET | ORAL | 0 refills | Status: AC

## 2018-01-14 MED ORDER — PREDNISONE 10 MG PO TABS: ORAL_TABLET | ORAL | 0 refills | Status: DC

## 2018-01-30 ENCOUNTER — Telehealth: Payer: Self-pay | Admitting: Gastroenterology

## 2018-01-30 NOTE — Telephone Encounter (Signed)
The pt states that he finished the prednisone and has noticed NO improvement in pain.  He also states the apriso has not helped.  Please see note below.     [place] results;   Notes recorded by Milus Banister, MD on 01/14/2018 at 12:50 PM EDT Please call the patient. There is a stone in the right kidney that is probably NOT causing his pains. I want him to complete a brief trial of steroids (prednisone 93m daily for 3 days, then 371mdaily for 3 days, then 2015maily for 3 days, then 98m30mily for 3 days, then 5mg 23mly for 3 days, then off). He needs to call me about 1 week AFTER he completes the steroid taper to let me know how/if it affected his pains. thanks  9/4 office note;  Review of pertinent gastrointestinal problems:  1. Crohn's disease, diagnosed September 2012 when he presented with intermittent rectal bleeding, loose stools. Colonoscopy September 2012 showed 20 cm segment in his left colon with chronic, also active colitis on biopsy. Terminal ileum found active ileitis as well on biopsy. TPMT enzyme level was normal. Small bowel follow-through was also normal. 01/2011 he was started on azathiaprine 200mg 90mday, then no -showed for three appts from 2012 until 03/2013. He had been off all crohn's meds for 1-2 year by time of 03/2013 follow up, I started him on antispasm meds, ROV scheduled for several weeks later but he never showed for that appt.  02/2015 appt set up but he no showed again. 07/2015 office visit he had been off of all Crohn's medicines for 2-1/2 years.  I recommended a colonoscopy to restage his bowel disease.  He eventually showed up for the colonoscopy 2 years later.  Colonoscopy April 2019 (similar to 2012 colonoscopy) found irregular terminal ileum and segment of sigmoid colon.  These areas were slightly inflamed appearing congested.  Multiple biopsies were taken from the ileum and the colon. TI biopsies were normal,  Sigmoid biopsies showed minimally active chronic  colitis.   HPI: This is a very pleasant 42 yea70old man whom I last saw at the time of a colonoscopy about 5 months ago.  See that results summarized above.  Chief complaint is intermittent right upper quadrant pains  He was here in our office about 2 months ago and saw JessicJanett Billowas having abdominal pains and loose, blood stools.  She started apriso full dose, lomotil refills, probiotic.   She arranged for an MRI enterography: 11/2017: Mild active terminal ileitis. Short segments of mild active Crohn colitis in the cecum, proximal sigmoid colon and rectosigmoid junction. No evidence of fistula, abscess or obstruction.  Has pain in RUQ, can be fine for days. Then intermittent RUQ pains.  His pains seem like kidney stones in the past.  He does not feel much better since starting the a pre-so  BMs and eating do NOT help the pains.  He's been gaining weight (up 15 poundsn in a year).  ROS: complete GI ROS as described in HPI, all other review negative.  Constitutional:  No unintentional weight loss   Past Medical History:  Diagnosis Date  . Allergic rhinitis   . Bruxism   . Crohn's disease (HCC)  [place] current med.  . HistMarland Kitchenry of chronic sinusitis   . History of kidney stones   . History of kidney stones   . Hypertension   . Hypogonadism male   . Left ureteral stone   . Mild obstructive sleep apnea  per study 10-05-2009--  INTERMITTANT CPAP USE  . Nephrolithiasis    bilateral  non-obstructive per urologist note  . Plantar fasciitis 2010   bilateral  . Sleep apnea    CPAP use  . Wears contact lenses     Past Surgical History:  Procedure Laterality Date  . ADENOIDECTOMY  as child  . COLONOSCOPY  2012  . CYSTOSCOPY/URETEROSCOPY/HOLMIUM LASER/STENT PLACEMENT Bilateral 08/15/2015   Procedure: CYSTOSCOPY/ LEFT URETEROSCOPY, POSSIBLE RIGHT URETEROSCOPY [place] LASER/STENT PLACEMENT;  Surgeon: Nickie Retort, MD;  Location: [facility];  Service: Urology;   Laterality: Bilateral;  . ETHMOIDECTOMY Bilateral 07/17/2009   anterior endoscopic , polypectomy, and bilateral maxillay antrostomy  . HAND SURGERY Right    fingertip amputation  . HOLMIUM LASER APPLICATION Bilateral 6/50/3546   Procedure: HOLMIUM LASER APPLICATION;  Surgeon: Nickie Retort, MD;  Location: [facility];  Service: Urology;  Laterality: Bilateral;  . NASAL SINUS SURGERY  2016   at [place]  as child    Current Outpatient Medications  Medication Sig Dispense Refill  . amLODipine (NORVASC) 5 MG tablet Take 1 tablet (5 mg total) by mouth daily. 90 tablet 3  . Cholecalciferol (VITAMIN D PO) Take by mouth.    . diphenoxylate-atropine (LOMOTIL) 2.5-0.025 MG tablet Take 1 tablet by mouth 4 (four) times daily as needed for diarrhea or loose stools. 60 tablet 1  . mesalamine (APRISO) 0.375 g 24 hr capsule Take 4 capsules (1.5 g total) by mouth daily. 120 capsule 3  . metoprolol succinate (TOPROL-XL) 25 MG 24 hr tablet Take 1 tablet (25 mg total) by mouth daily. 90 tablet 3   Current Facility-Administered Medications  Medication Dose Route Frequency Provider Last Rate Last Dose  . 0.9 %  sodium chloride infusion  500 mL Intravenous Continuous Milus Banister, MD        Allergies as of 12/31/2017 - Review Complete 12/31/2017  Allergen Reaction Noted  . Amlodipine  11/29/2015    Family History  Problem Relation Age of Onset  . Allergic rhinitis Mother   . Breast cancer Mother   . Lung cancer Father 35  . Colon polyps Father   . Prostate cancer Father   . Allergic rhinitis Daughter   . Allergic rhinitis Son   . Asthma Son   . Colon cancer Neg Hx     Social History   Socioeconomic History  . Marital status: Married    Spouse name: Not on file  . Number of children: 3  . Years of education: Not on file  . Highest education level: Not on file  Occupational History  . Occupation: Metallurgist: Industrial/product designer  . Occupation: English as a second language teacher: [employer]  . Financial resource strain: Not on file  . Food insecurity:    Worry: Not on file    Inability: Not on file  . Transportation needs:    Medical: Not on file    Non-medical: Not on file  Tobacco Use  . Smoking status: Never Smoker  . Smokeless tobacco: Never Used  Substance and Sexual Activity  . Alcohol use: No  . Drug use: No  . Sexual activity: Yes  Lifestyle  . Physical activity:    Days per week: Not on file    Minutes per session: Not on file  . Stress: Not on file  Relationships  . Social connections:    Talks on  phone: Not on file    Gets together: Not on file    Attends religious service: Not on file    Active member of club or organization: Not on file    Attends meetings of clubs or organizations: Not on file    Relationship status: Not on file  . Intimate partner violence:    Fear of current or ex partner: Not on file    Emotionally abused: Not on file    Physically abused: Not on file    Forced sexual activity: Not on file  Other Topics Concern  . 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  There is some mild inflammation by MR enterography and endoscopically in his terminal ileum, path biopsies were normal however.  He can have absolutely no discomfort for many days and then for 2 or 3 days be bothered by colicky right  sided, right upper quadrant abdominal pain.  He says this reminds him of kidney stone pain however he is seeing his urologist even just a month ago and they do not think his pains are related to kidney stone disease.  Perhaps he has gallbladder problems, biliary colic or gallstone disease.  Been order right upper quadrant ultrasound.  I think if that fails to show obvious explanation for his intermittent right upper quadrant pains and I would likely try a brief trial of steroids.  I am reluctant to put him on any stronger inflammatory bowel disease such as immunomodulators or Biologics without more clear evidence that his symptoms are related to IBD.  Also he has a well-documented history of noncompliance making both of those therapies also a bit more challenging, risky.  He will stay on his full strength oral mesalamine for now.  Please see the "Patient Instructions" section for addition details about the plan.  Owens Loffler, MD [facility] 12/31/2017, 2:02 PM

## 2018-02-03 ENCOUNTER — Ambulatory Visit: Payer: 59 | Admitting: Gastroenterology

## 2018-02-03 NOTE — Telephone Encounter (Signed)
   I would like to see him in the office to discuss this further.  I have an opening tomorrow Wednesday, October 9 and also Wednesday, October 16 according to my schedule in epic.  Please offer him 1 of those.

## 2018-02-03 NOTE — Telephone Encounter (Signed)
Spoke with pt and he is aware. Pt scheduled to see Dr. Ardis Hughs 02/11/18@3 :45pm. Pt aware of appt.

## 2018-02-11 ENCOUNTER — Ambulatory Visit: Payer: 59 | Admitting: Gastroenterology

## 2018-02-11 ENCOUNTER — Other Ambulatory Visit (INDEPENDENT_AMBULATORY_CARE_PROVIDER_SITE_OTHER): Payer: 59

## 2018-02-11 ENCOUNTER — Encounter: Payer: Self-pay | Admitting: Gastroenterology

## 2018-02-11 VITALS — BP 152/96 | HR 80 | Ht 74.0 in | Wt 289.0 lb

## 2018-02-11 DIAGNOSIS — K639 Disease of intestine, unspecified: Secondary | ICD-10-CM | POA: Diagnosis not present

## 2018-02-11 DIAGNOSIS — K5010 Crohn's disease of large intestine without complications: Secondary | ICD-10-CM

## 2018-02-11 DIAGNOSIS — R109 Unspecified abdominal pain: Secondary | ICD-10-CM

## 2018-02-11 DIAGNOSIS — R1011 Right upper quadrant pain: Secondary | ICD-10-CM | POA: Diagnosis not present

## 2018-02-11 DIAGNOSIS — Z23 Encounter for immunization: Secondary | ICD-10-CM | POA: Diagnosis not present

## 2018-02-11 DIAGNOSIS — K625 Hemorrhage of anus and rectum: Secondary | ICD-10-CM

## 2018-02-11 DIAGNOSIS — R197 Diarrhea, unspecified: Secondary | ICD-10-CM | POA: Diagnosis not present

## 2018-02-11 LAB — HIGH SENSITIVITY CRP: CRP, High Sensitivity: 1.04 mg/L (ref 0.000–5.000)

## 2018-02-11 LAB — SEDIMENTATION RATE: Sed Rate: 19 mm/hr — ABNORMAL HIGH (ref 0–15)

## 2018-02-11 NOTE — Progress Notes (Signed)
Review of pertinent gastrointestinal problems:  1. Crohn's disease, diagnosed September 2012 when he presented with intermittent rectal bleeding, loose stools. Colonoscopy September 2012 showed 20 cm segment in his left colon with chronic, also active colitis on biopsy. Terminal ileum found active ileitis as well on biopsy. TPMT enzyme level was normal. Small bowel follow-through was also normal. 01/2011 he was started on azathiaprine 257m per day, then no -showed for three appts from 2012 until 03/2013. He had been off all crohn's meds for 1-2 year by time of 03/2013 follow up, I started him on antispasm meds, ROV scheduled for several weeks later but he never showed for that appt. 02/2015 appt set up but he no showed again. 07/2015 office visit he had been off of all Crohn's medicines for 2-1/2 years.  I recommended a colonoscopy to restage his bowel disease.  He eventually showed up for the colonoscopy 2 years later.  Colonoscopy April 2019 (similar to 2012 colonoscopy) found irregular terminal ileum and segment of sigmoid colon.  These areas were slightly inflamed appearing congested.  Multiple biopsies were taken from the ileum and the colon. TI biopsies were normal,  Sigmoid biopsies showed minimally active chronic colitis.   MRI enterography: 11/2017: Mild active terminal ileitis. Short segments of mild active Crohn colitis in the cecum, proximal sigmoid colon and rectosigmoid junction.     HPI: This is a very pleasant 42year old man whom I last saw about 2 months ago here in the office  Chief complaint is Crohn's ileocolitis   Tells me today that he really never took much of the azathiaprine ('probably only 7 pills total') in the past.  I have had significant question about whether he has been having an IBD flare.  His pain was atypical, he has been gaining weight.  September 2019 right upper quadrant ultrasound was essentially normal.   Steroid trial starting at 40 mg/day for 3 days and  then tapering over about 15 days    ROS: complete GI ROS as described in HPI, all other review negative.  Constitutional:  No unintentional weight loss  weioght is up 4 pounds since last visit 2 months ago.   Past Medical History:  Diagnosis Date  . Allergic rhinitis   . Bruxism   . Crohn's disease ([place]    no current med.  .Marland KitchenHistory of chronic sinusitis   . History of kidney stones   . History of kidney stones   . Hypertension   . Hypogonadism male   . Left ureteral stone   . Mild obstructive sleep apnea    per study 10-05-2009--  INTERMITTANT CPAP USE  . Nephrolithiasis    bilateral  non-obstructive per urologist note  . Plantar fasciitis 2010   bilateral  . Sleep apnea    CPAP use  . Wears contact lenses     Past Surgical History:  Procedure Laterality Date  . ADENOIDECTOMY  as child  . COLONOSCOPY  2012  . CYSTOSCOPY/URETEROSCOPY/HOLMIUM LASER/STENT PLACEMENT Bilateral 08/15/2015   Procedure: CYSTOSCOPY/ LEFT URETEROSCOPY, POSSIBLE RIGHT URETEROSCOPY /[place]ER/STENT PLACEMENT;  Surgeon: BNickie Retort MD;  [facility]  Service: Urology;  Laterality: Bilateral;  . ETHMOIDECTOMY Bilateral 07/17/2009   anterior endoscopic , polypectomy, and bilateral maxillay antrostomy  . HAND SURGERY Right    fingertip amputation  . HOLMIUM LASER APPLICATION Bilateral 42/75/1700  Procedure: HOLMIUM LASER APPLICATION;  Surgeon: BNickie Retort MD;  [facility]  Service: Urology;  Laterality: Bilateral;  .  NASAL SINUS SURGERY  2016   at [place]  as child    Current Outpatient Medications  Medication Sig Dispense Refill  . amLODipine (NORVASC) 5 MG tablet Take 1 tablet (5 mg total) by mouth daily. 90 tablet 3  . diphenoxylate-atropine (LOMOTIL) 2.5-0.025 MG tablet Take 1 tablet by mouth 4 (four) times daily as needed for diarrhea or loose stools. 60 tablet 1  . metoprolol succinate  (TOPROL-XL) 25 MG 24 hr tablet Take 1 tablet (25 mg total) by mouth daily. 90 tablet 3  . Cholecalciferol (VITAMIN D PO) Take by mouth.    . mesalamine (APRISO) 0.375 g 24 hr capsule Take 4 capsules (1.5 g total) by mouth daily. 120 capsule 3   Current Facility-Administered Medications  Medication Dose Route Frequency Provider Last Rate Last Dose  . 0.9 %  sodium chloride infusion  500 mL Intravenous Continuous Milus Banister, MD        Allergies as of 02/11/2018 - Review Complete 02/11/2018  Allergen Reaction Noted  . Amlodipine  11/29/2015    Family History  Problem Relation Age of Onset  . Allergic rhinitis Mother   . Breast cancer Mother   . Lung cancer Father 109  . Colon polyps Father   . Prostate cancer Father   . Allergic rhinitis Daughter   . Allergic rhinitis Son   . Asthma Son   . Colon cancer Neg Hx     Social History   Socioeconomic History  . Marital status: Married    Spouse name: Not on file  . Number of children: 3  . Years of education: Not on file  . Highest education level: Not on file  Occupational History  . Occupation: Metallurgist: Proofreader  . 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 Financial resource strain: Not on file  . Food insecurity:    Worry: Not on file    Inability: Not on file  . Transportation needs:    Medical: Not on file    Non-medical: Not on file  Tobacco Use  . Smoking status: Never Smoker  . Smokeless tobacco: Never Used  Substance and Sexual Activity  . Alcohol use: No  . Drug use: No  . Sexual activity: Yes  Lifestyle  . Physical activity:    Days per week: Not on file    Minutes per session: Not on file  . Stress: Not on file  Relationships  . Social connections:    Talks on phone: Not on file    Gets together: Not on file    Attends religious service: Not on file    Active member of club or organization: Not on file    Attends meetings of clubs or  organizations: Not on file    Relationship status: Not on file  . Intimate partner violence:    Fear of current or ex partner: Not on file    Emotionally abused: Not on file    Physically abused: Not on file    Forced sexual activity: Not on file  Other Topics Concern  . Not on file  Social History Narrative   Regular exercise- No   1 Caffeine drink daily      Physical Exam: BP (!) 152/96   Pulse 80   Ht 6' 2"  (1.88 m)   Wt 289 lb (131.1 kg)   BMI 37.11 kg/m  Constitutional: generally well-appearing Psychiatric: alert  and oriented x3 Abdomen: soft, nontender, nondistended, no obvious ascites, no peritoneal signs, normal bowel sounds No peripheral edema noted in lower extremities  Assessment and plan: 42 y.o. male withCrohn's ileocolitis  It is not been clear whether his symptoms recently are from his Crohn's ileocolitis.  Is well-documented noncompliance certainly clouds the picture and today he tells me that he probably only took 7 pills of his azathioprine total during the 1 or 2 years that I presume he was on treatment.  He has had multiple no-shows.  For the past 2 or 3 months at least he has had worsening of his urgency, some right upper quadrant pains, intermittent blood in his stool and some diarrhea.  Colonoscopy, MRI both suggested at least somewhat active Crohn's ileocolitis.  His right upper quadrant pain was a bit out of proportion however.  His nonresponse to a 2-week prednisone dose has may be a bit confused as well  He thinks that his oral mesalamine is starting to work however he believes it is because he is taking apriso 1 pill 4 times a day instead of 4 pills once a day.  I explained to him that that should not make any difference and that the pill was.designed to be taken all at once.  Either way he is definitely feeling better over the last week or 2 with less pains no bleeding a bit more firm stool.  We talked about potentially restarting him on azathioprine but  he said he will not do that because of fears of side effects and that is why he only took a few of the pills previously.   We talked about Biologics and he would consider starting those if he starts to have recurrence of symptoms.  Today we will get him up-to-date on immunizations with flu shot and Prevnar 13 with Pneumovax in 2 to 3 months from now.  He will get hepatitis and HIV and TB testing done today as well.  He will return to see me in 2 to 3 months he knows to call sooner if he is having troubles again.  Please see the "Patient Instructions" section for addition details about the plan.  Owens Loffler, MD [facility] 02/11/2018, 3:58 PM

## 2018-02-11 NOTE — Patient Instructions (Addendum)
Continue apriso 4 pills once daily.  Please return to see Dr. Ardis Hughs in 2-3 months.    Call sooner if needed.  Flu shot today  Prevnar 13 now and then pneumovax in 3 months. Please call office in the next few weeks to make follow up appointment.  You will have labs checked today in the basement lab.  Please head down after you check out with the front desk  (HIV, hepatitis B S Ab, Hepaitits B S Ag, Hepatitis C Ab, TB quant gold).  Thank you for entrusting me with your care and choosing [facility].  Dr Ardis Hughs

## 2018-02-13 LAB — HEPATITIS B SURFACE ANTIGEN: Hepatitis B Surface Ag: NONREACTIVE

## 2018-02-13 LAB — QUANTIFERON-TB GOLD PLUS
Mitogen-NIL: >10 IU/mL
NIL: 0.33 [IU]/mL
QUANTIFERON-TB GOLD PLUS: NEGATIVE
TB1-NIL: <0 IU/mL

## 2018-02-13 LAB — HEPATITIS C ANTIBODY
HEP C AB: NONREACTIVE
SIGNAL TO CUT-OFF: 0.02 (ref <1.00)

## 2018-02-13 LAB — HEPATITIS B SURFACE ANTIBODY,QUALITATIVE: Hep B S Ab: NONREACTIVE

## 2018-02-13 LAB — HIV ANTIBODY (ROUTINE TESTING W REFLEX): HIV: NONREACTIVE

## 2018-02-23 ENCOUNTER — Ambulatory Visit: Payer: 59 | Admitting: Internal Medicine

## 2018-02-23 DIAGNOSIS — Z0289 Encounter for other administrative examinations: Secondary | ICD-10-CM

## 2018-04-13 DIAGNOSIS — H16223 Keratoconjunctivitis sicca, not specified as Sjogren's, bilateral: Secondary | ICD-10-CM | POA: Diagnosis not present

## 2018-04-13 DIAGNOSIS — H40033 Anatomical narrow angle, bilateral: Secondary | ICD-10-CM | POA: Diagnosis not present

## 2018-04-20 ENCOUNTER — Telehealth: Payer: Self-pay | Admitting: Gastroenterology

## 2018-04-20 NOTE — Telephone Encounter (Signed)
Pt states he is ready to start Stelara and would like to discuss. Will sched for Feb

## 2018-04-20 NOTE — Telephone Encounter (Signed)
The pt has been scheduled to come in on 1/28 to discuss Stelara vs azathioprine.  He is aware and will call back if he has any concerns

## 2018-05-20 ENCOUNTER — Other Ambulatory Visit: Payer: Self-pay | Admitting: Internal Medicine

## 2018-05-24 ENCOUNTER — Other Ambulatory Visit: Payer: Self-pay | Admitting: Gastroenterology

## 2018-05-26 ENCOUNTER — Encounter: Payer: Self-pay | Admitting: Gastroenterology

## 2018-05-26 ENCOUNTER — Ambulatory Visit: Payer: 59 | Admitting: Gastroenterology

## 2018-05-26 VITALS — BP 120/76 | HR 94 | Ht 75.0 in | Wt 289.0 lb

## 2018-05-26 DIAGNOSIS — Z23 Encounter for immunization: Secondary | ICD-10-CM | POA: Diagnosis not present

## 2018-05-26 DIAGNOSIS — K5080 Crohn's disease of both small and large intestine without complications: Secondary | ICD-10-CM

## 2018-05-26 NOTE — Progress Notes (Signed)
Review of pertinent gastrointestinal problems:  1. Crohn's ileocolitis, diagnosed September 2012 when he presented with intermittent rectal bleeding, loose stools. Colonoscopy September 2012 showed 20 cm segment in his left colon with chronic, also active colitis on biopsy. Terminal ileum found active ileitis as well on biopsy. TPMT enzyme level was normal. Small bowel follow-through was also normal. 01/2011 he was started on azathiaprine 222m per dayn (he admitted in 2019 that he almost never took the azathioprine), then no -showed for three appts from 2012 until 03/2013. He had been off all crohn's meds for 1-2 year by time of 03/2013 follow up, I started him on antispasm meds, ROV scheduled for several weeks later but he never showed for that appt. 02/2015 appt set up but he no showed again.4/2017office visit he had been off of all Crohn's medicines for 2-1/2 years. I recommended a colonoscopy to restage his bowel disease. He eventually showed up for the colonoscopy 2 years later.Colonoscopy April 2019(similar to 2012 colonoscopy)found irregular terminal ileum and segment of sigmoid colon. These areas were slightly inflamed appearing congested. Multiple biopsies were taken from the ileum and the colon. TI biopsies were normal, Sigmoid biopsies showed minimally active chronic colitis. MRIenterography: 11/2017:Mild active terminal ileitis. Short segments of mild active Crohn colitis in the cecum, proximal sigmoid colon and rectosigmoid junction.  TB quant gold negative, HIV negative, hepatitis B surface antibody negative, hepatitis B surface antigen negative, hepatitis C antibody negative -October 2019  Sed rate October 2019 was 177 Immunized flu shot, prevnar 13 01/2018 (due for pneumovax 23 2-3 months later)  HPI: This is a   very pleasant 43year old man whom I last saw about 3 months ago.  He has Crohn's ileocolitis and at the time of his last visit we discussed starting Biologics.   He was not really sure if he wanted to do it at that point.  He has been watching his diet a lot.  No bleeding, more firm.  Avoiding sodas.  apriso 4 pills once daily.  Gaining weight.  When a flare comes on he usually has right-sided abdominal pains and also diarrhea, blood-tinged, frequent up to 5-7 stools per day.  He is starting to have some of his typical right-sided abdominal pains that proceed to flare.  He plans to be starting a new job in the next few weeks and he is hoping to get his Crohn's under as best control as possible since his last job he had to quit because of his urgency, diarrhea that would interrupt his work schedule quite frequently.   Chief complaint is Crohn's ileocolitis  ROS: complete GI ROS as described in HPI, all other review negative.  Constitutional:  No unintentional weight loss   Past Medical History:  Diagnosis Date  . Allergic rhinitis   . Bruxism   . Crohn's disease ([place]    no current med.  .Marland KitchenHistory of chronic sinusitis   . History of kidney stones   . History of kidney stones   . Hypertension   . Hypogonadism male   . Left ureteral stone   . Mild obstructive sleep apnea    per study 10-05-2009--  INTERMITTANT CPAP USE  . Nephrolithiasis    bilateral  non-obstructive per urologist note  . Plantar fasciitis 2010   bilateral  . Sleep apnea    CPAP use  . Wears contact lenses     Past Surgical History:  Procedure Laterality Date  . ADENOIDECTOMY  as child  . COLONOSCOPY  2012  .  CYSTOSCOPY/URETEROSCOPY/HOLMIUM LASER/STENT PLACEMENT Bilateral 08/15/2015   Procedure: CYSTOSCOPY/ LEFT URETEROSCOPY, POSSIBLE RIGHT URETEROSCOPY [place] LASER/STENT PLACEMENT;  Surgeon: Nickie Retort, MD;  Location: [facility];  Service: Urology;  Laterality: Bilateral;  . ETHMOIDECTOMY Bilateral 07/17/2009   anterior endoscopic , polypectomy, and bilateral maxillay antrostomy  . HAND SURGERY Right    fingertip amputation  . HOLMIUM LASER  APPLICATION Bilateral 2/77/4128   Procedure: HOLMIUM LASER APPLICATION;  Surgeon: Nickie Retort, MD;  Location: [facility];  Service: Urology;  Laterality: Bilateral;  . NASAL SINUS SURGERY  2016   at [place]  as child    Current Outpatient Medications  Medication Sig Dispense Refill  . amLODipine (NORVASC) 5 MG tablet Take 1 tablet (5 mg total) by mouth daily. 90 tablet 3  . Cholecalciferol (VITAMIN D PO) Take by mouth.    . diphenoxylate-atropine (LOMOTIL) 2.5-0.025 MG tablet Take 1 tablet by mouth 4 (four) times daily as needed for diarrhea or loose stools. 60 tablet 1  . metoprolol succinate (TOPROL-XL) 25 MG 24 hr tablet Take 1 tablet (25 mg total) by mouth daily. Needs annual appointment for further refills 30 tablet 0  . mesalamine (APRISO) 0.375 g 24 hr capsule Take 4 capsules (1.5 g total) by mouth daily. 120 capsule 3   No current facility-administered medications for this visit.     Allergies as of 05/26/2018 - Review Complete 05/26/2018  Allergen Reaction Noted  . Amlodipine  11/29/2015    Family History  Problem Relation Age of Onset  . Allergic rhinitis Mother   . Breast cancer Mother   . Lung cancer Father 52  . Colon polyps Father   . Prostate cancer Father   . Allergic rhinitis Daughter   . Allergic rhinitis Son   . Asthma Son   . Colon cancer Neg Hx     Social History   Socioeconomic History  . Marital status: Married    Spouse name: Not on file  . Number of children: 3  . Years of education: Not on file  . Highest education level: Not on file  Occupational History  . Occupation: Metallurgist: Proofreader  . Occupation: English as a second language teacher: [employer]  . Financial resource strain: Not on file  . Food insecurity:    Worry: Not on file    Inability: Not on file  . Transportation needs:    Medical: Not on file    Non-medical: Not on file  Tobacco Use  . Smoking  status: Never Smoker  . Smokeless tobacco: Never Used  Substance and Sexual Activity  . Alcohol use: No  . Drug use: No  . Sexual activity: Yes  Lifestyle  . Physical activity:    Days per week: Not on file    Minutes per session: Not on file  . Stress: Not on file  Relationships  . Social connections:    Talks on phone: Not on file    Gets together: Not on file    Attends religious service: Not on file    Active member of club or organization: Not on file    Attends meetings of clubs or organizations: Not on file    Relationship status: Not on file  . Intimate partner violence:    Fear of current or ex partner: Not on file    Emotionally abused: Not on file    Physically abused: Not on  file    Forced sexual activity: Not on file  Other Topics Concern  . Not on file  Social History Narrative   Regular exercise- No   1 Caffeine drink daily      Physical Exam: BP 120/76 Comment: large cuff  Pulse 94   Ht 6' 3"  (1.905 m)   Wt 289 lb (131.1 kg)   BMI 36.12 kg/m  Constitutional: generally well-appearing Psychiatric: alert and oriented x3 Abdomen: soft, mildly tender right side abdomen, nondistended, no obvious ascites, no peritoneal signs, normal bowel sounds No peripheral edema noted in lower extremities  Assessment and plan: 43 y.o. male with Crohn's ileocolitis  He admitted to me at his last visit that he really never took azathioprine or only very sparingly 8 or 10 pills total over the many months that I thought he was on.  I do not think mesalamine is adequately controlling his symptoms and I think he is probably going to be starting to flare soon based on his usual course.  We discussed Biologics previously and he has decided to go ahead with that now.  He understands the risks and benefits and wishes to proceed.  Planning new start Stelara weight-based induction which would be 540 mg IV induction infusion followed by usual 90 mg subcu every 8 weeks.  He will return to  see me after his first subcu dose which should be in mid April or so.  He knows to call sooner if he has any serious questions or concerns.  He will also get some labs checked shortly before he returns the office including a CBC and complete metabolic profile.  Please see the "Patient Instructions" section for addition details about the plan.  Owens Loffler, MD [facility] 05/26/2018, 3:26 PM

## 2018-05-26 NOTE — Patient Instructions (Addendum)
Stelara new start; induction infusion 51m IV.  Then start 944mSC 8 weeks after the infusion and then every 8 weeks after that.  Please return to see Dr. JaArdis Hughsn late April, sooner if any problems.  Will need cbc, cmet a day or 2 prior to that RODare Pneumovax shot now.  Thank you for entrusting me with your care and choosing [facility] Dr JaArdis Hughs

## 2018-05-27 ENCOUNTER — Telehealth: Payer: Self-pay | Admitting: Gastroenterology

## 2018-05-27 NOTE — Telephone Encounter (Signed)
PT advised that he needs a refill for med (LOMOTIL). JG

## 2018-05-28 ENCOUNTER — Telehealth: Payer: Self-pay | Admitting: Gastroenterology

## 2018-05-28 ENCOUNTER — Telehealth: Payer: Self-pay

## 2018-05-28 NOTE — Telephone Encounter (Signed)
Patient notified that he will be contacted by [facility] to set up an appt to discuss new start Stelara 540 mg IV x 1.  He understands that we will then send in rx for approval to his pharmacy for 90 mg sq q 8 weeks.   He will listen out for call from [facility] to schedule with them directly.

## 2018-05-28 NOTE — Telephone Encounter (Signed)
faxed

## 2018-05-28 NOTE — Telephone Encounter (Signed)
-----   Message from Angie Fava, LPN sent at 4/35/6861  4:48 PM EST ----- Regarding: Tiburcio Pea, this patient was seen on 05/26/18. Dr Ardis Hughs wants him to start Stelara new start. Please see AVS

## 2018-05-28 NOTE — Telephone Encounter (Signed)
[facility] called to request a copy of pt's insurance card for a referral they had received from [place].  Fax: [phone]

## 2018-05-29 NOTE — Telephone Encounter (Signed)
Pam sent prescription in on 05/26/18 to [facility]. Patient notified.

## 2018-06-08 ENCOUNTER — Other Ambulatory Visit: Payer: Self-pay | Admitting: Gastroenterology

## 2018-06-08 ENCOUNTER — Telehealth: Payer: Self-pay | Admitting: Gastroenterology

## 2018-06-08 ENCOUNTER — Other Ambulatory Visit: Payer: Self-pay | Admitting: Internal Medicine

## 2018-06-08 DIAGNOSIS — K5090 Crohn's disease, unspecified, without complications: Secondary | ICD-10-CM | POA: Diagnosis not present

## 2018-06-08 DIAGNOSIS — Z79899 Other long term (current) drug therapy: Secondary | ICD-10-CM | POA: Diagnosis not present

## 2018-06-08 DIAGNOSIS — I10 Essential (primary) hypertension: Secondary | ICD-10-CM | POA: Diagnosis not present

## 2018-06-08 NOTE — Telephone Encounter (Signed)
I have provided the pt with the information that was needed. Please disregard thanks.

## 2018-06-08 NOTE — Telephone Encounter (Signed)
Pt called in wanting to get so information about his infusion that is sched to day at 9am

## 2018-06-08 NOTE — Telephone Encounter (Signed)
Pt has questions about EPI and requested a CB.

## 2018-06-08 NOTE — Telephone Encounter (Signed)
Dr Ardis Hughs the pt went to [facility] today and was told that his symptoms sounded like EPI.  He wanted your opinion.  Please advise

## 2018-06-15 DIAGNOSIS — K5090 Crohn's disease, unspecified, without complications: Secondary | ICD-10-CM | POA: Diagnosis not present

## 2018-06-18 ENCOUNTER — Other Ambulatory Visit: Payer: Self-pay | Admitting: Gastroenterology

## 2018-07-07 ENCOUNTER — Telehealth: Payer: Self-pay

## 2018-07-07 NOTE — Telephone Encounter (Signed)
Spoke to patient who needs a follow up appointment in April with labs a few days prior. Patient states he no longer has health insurance. He is in the process of calling some insurance companies that are in network. Colletta [place] is helping him with this process. He will call the office to schedule an appointment once this is worked out. Samples of Apriso given to patient.

## 2018-07-10 ENCOUNTER — Telehealth: Payer: Self-pay | Admitting: Gastroenterology

## 2018-07-10 NOTE — Telephone Encounter (Signed)
The pt had his stelara infusion 1 month ago and is due for injection in another month. He states that his symptoms have not improved at all.  He continues to have diarrhea and abd pain.  Please advise

## 2018-07-10 NOTE — Telephone Encounter (Signed)
Pt wants to speak with the nurse about his stelara.

## 2018-07-10 NOTE — Telephone Encounter (Signed)
He should continue with plans for his second Stelara infusion.  He should have an office appointment with me sometime in April already scheduled.  Explained to him that it might take more than just 1 dose of the Stelara to start getting an effect.  Let him know I heard about this question of pancreatic exocrine insufficiency and I think that that is unlikely causing any of his difficulties.  If he is still having problems after his second or third dose of Stelara then we would have to consider further testing, possibly repeat colonoscopy, possibly other testing.

## 2018-07-10 NOTE — Telephone Encounter (Signed)
See phone note from today.

## 2018-07-10 NOTE — Telephone Encounter (Signed)
The patient has been notified of this information and all questions answered. The pt will keep appt as planned and will call with any complaints prior to the appt

## 2018-07-15 ENCOUNTER — Telehealth: Payer: Self-pay | Admitting: Gastroenterology

## 2018-07-15 NOTE — Telephone Encounter (Signed)
Spoke to patient. He will come in today to pick up samples of Apriso

## 2018-07-15 NOTE — Telephone Encounter (Signed)
Pt called in wanting to know when is he able to come pick up the samples of apriso?

## 2018-07-17 ENCOUNTER — Other Ambulatory Visit: Payer: Self-pay | Admitting: Internal Medicine

## 2018-07-17 ENCOUNTER — Telehealth: Payer: Self-pay | Admitting: Internal Medicine

## 2018-07-17 NOTE — Telephone Encounter (Signed)
Copied from [place] [id number]. Topic: Quick Communication - Rx Refill/Question >> Jul 17, 2018  9:56 AM Leward Quan A wrote: Medication: amLODipine (NORVASC) 5 MG tablet, metoprolol succinate (TOPROL-XL) 25 MG 24 hr tablet  Has the patient contacted their pharmacy? Yes.   (Agent: If no, request that the patient contact the pharmacy for the refill.) (Agent: If yes, when and what did the pharmacy advise?)  Preferred Pharmacy (with phone number or street name): [id number] [phone](Phone)   Agent: Please be advised that RX refills may take up to 3 business days. We ask that you follow-up with your pharmacy.

## 2018-07-17 NOTE — Telephone Encounter (Signed)
Patient states his wife lost her job.  Therefore they lost their health insurance.  States he has gone through the market place and now has Nurse, mental health with [facility].  Patient states it is going to take him 2 to 3 weeks to establish care in that system.  He is requesting a months refill on meds.  States will be out of meds tomorrow.

## 2018-07-17 NOTE — Telephone Encounter (Signed)
Pt need OV, has not seen Plotnikov since 04/2017

## 2018-07-20 MED ORDER — METOPROLOL SUCCINATE ER 25 MG PO TB24: 25.0000 mg | ORAL_TABLET | Freq: Every day | ORAL | 0 refills | Status: DC

## 2018-07-20 MED ORDER — AMLODIPINE BESYLATE 5 MG PO TABS: 5.0000 mg | ORAL_TABLET | Freq: Every day | ORAL | 0 refills | Status: DC

## 2018-07-20 NOTE — Telephone Encounter (Signed)
30 day supply sent

## 2018-08-03 ENCOUNTER — Telehealth: Payer: Self-pay | Admitting: Gastroenterology

## 2018-08-03 MED ORDER — USTEKINUMAB 90 MG/ML ~~LOC~~ SOSY: 90.0000 mg | PREFILLED_SYRINGE | SUBCUTANEOUS | 6 refills | Status: DC

## 2018-08-03 NOTE — Telephone Encounter (Signed)
Prescription sent to [facility]

## 2018-08-04 ENCOUNTER — Telehealth: Payer: Self-pay | Admitting: Gastroenterology

## 2018-08-04 NOTE — Telephone Encounter (Signed)
FYI  I called patient to see if he had insurance that he wanted our office to file for 08/24/18 appointment. Patient states that he has blue local(we are out of network). He says that he does have a "webex" appointment on 08/19/18 with Dr. Kathleen Argue at [facility] and is requesting that our office mail records to that doctor. I informed him that I would mail him a medical records release form. He will fill form out and mail it back to our office.

## 2018-08-10 ENCOUNTER — Other Ambulatory Visit (INDEPENDENT_AMBULATORY_CARE_PROVIDER_SITE_OTHER): Payer: Self-pay

## 2018-08-10 DIAGNOSIS — K5080 Crohn's disease of both small and large intestine without complications: Secondary | ICD-10-CM

## 2018-08-10 LAB — COMPREHENSIVE METABOLIC PANEL
ALT: 29 U/L (ref 0–53)
AST: 22 U/L (ref 0–37)
Albumin: 4.2 g/dL (ref 3.5–5.2)
Alkaline Phosphatase: 96 U/L (ref 39–117)
BUN: 13 mg/dL (ref 6–23)
CO2: 27 mEq/L (ref 19–32)
Calcium: 9.1 mg/dL (ref 8.4–10.5)
Chloride: 103 mEq/L (ref 96–112)
Creatinine, Ser: 1 mg/dL (ref 0.40–1.50)
GFR: 97.68 mL/min (ref >60.00)
Glucose, Bld: 95 mg/dL (ref 70–99)
Potassium: 3.8 mEq/L (ref 3.5–5.1)
Sodium: 139 mEq/L (ref 135–145)
Total Bilirubin: 0.6 mg/dL (ref 0.2–1.2)
Total Protein: 8.1 g/dL (ref 6.0–8.3)

## 2018-08-10 LAB — CBC WITH DIFFERENTIAL/PLATELET
Basophils Absolute: 0.1 10*3/uL (ref 0.0–0.1)
Basophils Relative: 1.1 % (ref 0.0–3.0)
Eosinophils Absolute: 0.2 10*3/uL (ref 0.0–0.7)
Eosinophils Relative: 3.4 % (ref 0.0–5.0)
HCT: 48 % (ref 39.0–52.0)
Hemoglobin: 15.9 g/dL (ref 13.0–17.0)
Lymphocytes Relative: 32.3 % (ref 12.0–46.0)
Lymphs Abs: 2 10*3/uL (ref 0.7–4.0)
MCHC: 33.1 g/dL (ref 30.0–36.0)
MCV: 81.9 fl (ref 78.0–100.0)
Monocytes Absolute: 0.5 10*3/uL (ref 0.1–1.0)
Monocytes Relative: 8.7 % (ref 3.0–12.0)
Neutro Abs: 3.4 10*3/uL (ref 1.4–7.7)
Neutrophils Relative %: 54.5 % (ref 43.0–77.0)
Platelets: 360 10*3/uL (ref 150.0–400.0)
RBC: 5.87 Mil/uL — ABNORMAL HIGH (ref 4.22–5.81)
RDW: 13.8 % (ref 11.5–15.5)
WBC: 6.2 10*3/uL (ref 4.0–10.5)

## 2018-08-12 ENCOUNTER — Telehealth: Payer: Self-pay | Admitting: Gastroenterology

## 2018-08-12 NOTE — Telephone Encounter (Signed)
See alternate note

## 2018-08-12 NOTE — Telephone Encounter (Signed)
Pt has been trying to get his medication. Is very frustrated because he changed pharmacy and has not been able to get a hold of anyone. Would like to speak to someone.

## 2018-08-13 MED ORDER — USTEKINUMAB 90 MG/ML ~~LOC~~ SOSY: 90.0000 mg | PREFILLED_SYRINGE | SUBCUTANEOUS | 6 refills | Status: DC

## 2018-08-13 NOTE — Telephone Encounter (Signed)
Prescription sent as requested.

## 2018-08-16 ENCOUNTER — Other Ambulatory Visit: Payer: Self-pay | Admitting: Internal Medicine

## 2018-08-17 ENCOUNTER — Ambulatory Visit (INDEPENDENT_AMBULATORY_CARE_PROVIDER_SITE_OTHER): Payer: BLUE CROSS/BLUE SHIELD | Admitting: Internal Medicine

## 2018-08-17 ENCOUNTER — Encounter: Payer: Self-pay | Admitting: Internal Medicine

## 2018-08-17 DIAGNOSIS — K5080 Crohn's disease of both small and large intestine without complications: Secondary | ICD-10-CM

## 2018-08-17 DIAGNOSIS — E785 Hyperlipidemia, unspecified: Secondary | ICD-10-CM | POA: Diagnosis not present

## 2018-08-17 DIAGNOSIS — I10 Essential (primary) hypertension: Secondary | ICD-10-CM

## 2018-08-17 MED ORDER — AMLODIPINE BESYLATE 5 MG PO TABS: 5.0000 mg | ORAL_TABLET | Freq: Every day | ORAL | 3 refills | Status: DC

## 2018-08-17 MED ORDER — METOPROLOL SUCCINATE ER 25 MG PO TB24: 25.0000 mg | ORAL_TABLET | Freq: Every day | ORAL | 3 refills | Status: DC

## 2018-08-17 NOTE — Assessment & Plan Note (Signed)
The patient is on Stelara.  He is asking whether taking Stelara is dangerous during COVID-19 epidemics.  I told the patient that in my opinion Stelara should have a protective effect for serious pulmonary complication and that medicines like Stelara has been used to treat COVID-19 induced pneumonias.

## 2018-08-17 NOTE — Assessment & Plan Note (Signed)
Obtain labs in 3 months

## 2018-08-17 NOTE — Assessment & Plan Note (Signed)
I renewed amlodipine and Toprol

## 2018-08-17 NOTE — Progress Notes (Signed)
Virtual Visit via Telephone Note  I connected with Casey Kelly on 08/17/18 at  3:20 PM EDT by telephone and verified that I am speaking with the correct person using two identifiers.   I discussed the limitations, risks, security and privacy concerns of performing an evaluation and management service by telephone and the availability of in person appointments. I also discussed with the patient that there may be a patient responsible charge related to this service. The patient expressed understanding and agreed to proceed.   History of Present Illness:   Follow-up on hypertension.  The patient needs his meds refilled.  He is asking whether taking Stelara is dangerous during COVID-19 epidemics.  Observations/Objective:  The patient is in no acute distress Assessment and Plan:  See plan Follow Up Instructions:    I discussed the assessment and treatment plan with the patient. The patient was provided an opportunity to ask questions and all were answered. The patient agreed with the plan and demonstrated an understanding of the instructions.   The patient was advised to call back or seek an in-person evaluation if the symptoms worsen or if the condition fails to improve as anticipated.  I provided 20 minutes of non-face-to-face time during this encounter.   Walker Kehr, MD

## 2018-08-18 ENCOUNTER — Telehealth: Payer: Self-pay | Admitting: Gastroenterology

## 2018-08-18 NOTE — Telephone Encounter (Signed)
K50.00 has been provided to [facility] prime at the returned number

## 2018-08-18 NOTE — Telephone Encounter (Signed)
Aris calling from [employer], they need the ICD code for [place]. They states that they have been trying to get this information from [place] for a while ago and now the pt is threating to take legal actions against them. Pls call Aris.

## 2018-08-18 NOTE — Telephone Encounter (Signed)
Prior auth started via Cover My Meds

## 2018-08-18 NOTE — Telephone Encounter (Signed)
[facility] called gave me cover my med key: [id number]   For pre A that has been sent.   Rollene Fare [phone]

## 2018-08-19 NOTE — Telephone Encounter (Signed)
Stelara approved from 08/18/18-08/16/2021. According to Cover My Meds Site- "Your request has been approved Effective from 08/18/2018 through 08/16/2021"

## 2018-08-24 ENCOUNTER — Ambulatory Visit: Payer: 59 | Admitting: Gastroenterology

## 2018-08-26 ENCOUNTER — Ambulatory Visit (INDEPENDENT_AMBULATORY_CARE_PROVIDER_SITE_OTHER): Payer: BLUE CROSS/BLUE SHIELD | Admitting: Gastroenterology

## 2018-08-26 ENCOUNTER — Encounter: Payer: Self-pay | Admitting: Gastroenterology

## 2018-08-26 ENCOUNTER — Telehealth: Payer: Self-pay | Admitting: Gastroenterology

## 2018-08-26 ENCOUNTER — Other Ambulatory Visit: Payer: Self-pay | Admitting: Gastroenterology

## 2018-08-26 ENCOUNTER — Other Ambulatory Visit: Payer: Self-pay

## 2018-08-26 VITALS — BP 120/76 | HR 94 | Ht 75.0 in | Wt 289.0 lb

## 2018-08-26 DIAGNOSIS — K5080 Crohn's disease of both small and large intestine without complications: Secondary | ICD-10-CM | POA: Diagnosis not present

## 2018-08-26 MED ORDER — APRISO 0.375 G PO CP24: ORAL_CAPSULE | ORAL | 3 refills | Status: DC

## 2018-08-26 NOTE — Progress Notes (Signed)
Review of pertinent gastrointestinal problems:  1. Crohn's ileocolitis, diagnosed September 2012 when he presented with intermittent rectal bleeding, loose stools. Colonoscopy September 2012 showed 20 cm segment in his left colon with chronic, also active colitis on biopsy. Terminal ileum found active ileitis as well on biopsy. TPMT enzyme level was normal. Small bowel follow-through was also normal. 01/2011 he was started on azathiaprine 233m per dayn (he admitted in 2019 that he almost never took the azathioprine, probably only "8-10 pills total" over the several months that I thought he was on it), then no -showed for three appts from 2012 until 03/2013. He had been off all crohn's meds for 1-2 year by time of 03/2013 follow up, I started him on antispasm meds, ROV scheduled for several weeks later but he never showed for that appt. 02/2015 appt set up but he no showed again.4/2017office visit he had been off of all Crohn's medicines for 2-1/2 years. I recommended a colonoscopy to restage his bowel disease. He eventually showed up for the colonoscopy 2 years later.Colonoscopy April 2019(similar to 2012 colonoscopy)found irregular terminal ileum and segment of sigmoid colon. These areas were slightly inflamed appearing congested. Multiple biopsies were taken from the ileum and the colon. TI biopsies were normal, Sigmoid biopsies showed minimally active chronic colitis. MRIenterography: 11/2017:Mild active terminal ileitis. Short segments of mild active Crohn colitis in the cecum, proximal sigmoid colon and rectosigmoid junction.  TB quant gold negative, HIV negative, hepatitis B surface antibody negative, hepatitis B surface antigen negative, hepatitis C antibody negative -October 2019  Sed rate October 2019 was 162 Immunized flu shot, prevnar 13 01/2018 (due for pneumovax 23 2-3 months later)  Recommended new start stelara 04/2018 weight based induction 5457mIV infusion followed by subc  9062mvery 8 weeks.  This service was provided via virtual visit.   Only audio was used.  The patient was located at home.  I was located in my office.  The patient did consent to this virtual visit and is aware of possible charges through their insurance for this visit.  The patient is an established patient.  My certified medical assistant, KelGrace Bushyontributed to this visit by contacting the patient by phone 1 or 2 business days prior to the appointment and also followed up on the recommendations I made after the visit.  Time spent on virtual visit: 18 min   HPI: This is a pleasant [age]ar old man whom I visited with via telemedicine today.  I last saw him in the office several months ago and we decided to start Stelara.  His first IV infusion was in February and his first subcu self-administered shot was last week.  He has not noticed any significant changes since starting Stelara: still has intermittent solid, loose stools.  He has 7 times per day, maybe.  Never in middle of the night.  Still has pains in right side of abdomen, very bothersome.  Needs a refill of lomotil.  Needs a refill of apriso.  He met Dr.  KocDerrill Kaya telemedicine last week and he is meeting Rich Bloomfeld and is switching care to him.  Chief complaint is Crohn's ileocolitis  ROS: complete GI ROS as described in HPI, all other review negative.  Constitutional:  No unintentional weight loss   Past Medical History:  Diagnosis Date  . Allergic rhinitis   . Bruxism   . Crohn's disease (HCCStraughn  no current med.  . HMarland Kitchenstory of chronic sinusitis   . History of  kidney stones   . History of kidney stones   . Hypertension   . Hypogonadism male   . Left ureteral stone   . Mild obstructive sleep apnea    per study 10-05-2009--  INTERMITTANT CPAP USE  . Nephrolithiasis    bilateral  non-obstructive per urologist note  . Plantar fasciitis 2010   bilateral  . Sleep apnea    CPAP use  . Wears contact lenses      Past Surgical History:  Procedure Laterality Date  . ADENOIDECTOMY  as child  . COLONOSCOPY  2012  . CYSTOSCOPY/URETEROSCOPY/HOLMIUM LASER/STENT PLACEMENT Bilateral 08/15/2015   Procedure: CYSTOSCOPY/ LEFT URETEROSCOPY, POSSIBLE RIGHT URETEROSCOPY [place] LASER/STENT PLACEMENT;  Surgeon: Nickie Retort, MD;  Location: [facility];  Service: Urology;  Laterality: Bilateral;  . ETHMOIDECTOMY Bilateral 07/17/2009   anterior endoscopic , polypectomy, and bilateral maxillay antrostomy  . HAND SURGERY Right    fingertip amputation  . HOLMIUM LASER APPLICATION Bilateral 5/46/5035   Procedure: HOLMIUM LASER APPLICATION;  Surgeon: Nickie Retort, MD;  Location: [facility];  Service: Urology;  Laterality: Bilateral;  . NASAL SINUS SURGERY  2016   at [place]  as child    Current Outpatient Medications  Medication Sig Dispense Refill  . amLODipine (NORVASC) 5 MG tablet Take 1 tablet (5 mg total) by mouth daily. 90 tablet 3  . APRISO 0.375 g 24 hr capsule TAKE 4 CAPSULES(1.5 GRAMS) BY MOUTH DAILY 120 capsule 0  . Cholecalciferol (VITAMIN D PO) Take by mouth.    . diphenoxylate-atropine (LOMOTIL) 2.5-0.025 MG tablet Take 1 tablet by mouth 4 (four) times daily as needed for diarrhea or loose stools. 120 tablet 0  . metoprolol succinate (TOPROL-XL) 25 MG 24 hr tablet Take 1 tablet (25 mg total) by mouth daily. 90 tablet 3  . ustekinumab (STELARA) 90 MG/ML SOSY injection Inject 1 mL (90 mg total) into the skin every 8 (eight) weeks. 1 Syringe 6   No current facility-administered medications for this visit.     Allergies as of 08/26/2018 - Review Complete 08/25/2018  Allergen Reaction Noted  . Amlodipine  11/29/2015    Family History  Problem Relation Age of Onset  . Allergic rhinitis Mother   . Breast cancer Mother   . Lung cancer Father 71  . Colon polyps Father   . Prostate cancer Father   . Allergic rhinitis  Daughter   . Allergic rhinitis Son   . Asthma Son   . Colon cancer Neg Hx     Social History   Socioeconomic History  . Marital status: Married    Spouse name: Not on file  . Number of children: 3  . Years of education: Not on file  . Highest education level: Not on file  Occupational History  . Occupation: Metallurgist: Proofreader  . Occupation: English as a second language teacher: [place]  . Financial resource strain: Not on file  . Food insecurity:    Worry: Not on file    Inability: Not on file  . Transportation needs:    Medical: Not on file    Non-medical: Not on file  Tobacco Use  . Smoking status: Never Smoker  . Smokeless tobacco: Never Used  Substance and Sexual Activity  . Alcohol use: No  . Drug use: No  . Sexual activity: Yes  Lifestyle  . Physical activity:    Days per week:  Not on file    Minutes per session: Not on file  . Stress: Not on file  Relationships  . Social connections:    Talks on phone: Not on file    Gets together: Not on file    Attends religious service: Not on file    Active member of club or organization: Not on file    Attends meetings of clubs or organizations: Not on file    Relationship status: Not on file  . Intimate partner violence:    Fear of current or ex partner: Not on file    Emotionally abused: Not on file    Physically abused: Not on file    Forced sexual activity: Not on file  Other Topics Concern  . Not on file  Social History Narrative   Regular exercise- No   1 Caffeine drink daily      Physical Exam: Unable to perform because this was a "telemed visit" due to current Covid-19 pandemic  Assessment and plan: 43 y.o. male with Crohn's ileocolitis  He is on biologic Stelara, started 2 months ago.  His first self-administered injection was just last week.  I have struggled to get his Crohn's under good clinical control, in part due to significant noncompliance however over the past  year or so he seems to be complying quite a bit better than in earlier years.  I had him on immunomodulators for what to me seemed like a year or 2 however he admitted more recently that he only probably took 8 or 10 pills total during that time.  His insurance has changed and he is switching his gastroenterology care to [facility].  He is already met with 1 of the doctors via telemedicine and is seeing Dr. Maryelizabeth Kaufmann tomorrow as a new patient.  I am happy to refill his mesalamine, his Lomotil today but he knows future medicine prescriptions should be through his new primary GI doc  Please see the "Patient Instructions" section for addition details about the plan.  Owens Loffler, MD [facility] 08/26/2018, 8:44 AM

## 2018-08-26 NOTE — Patient Instructions (Addendum)
He is switching his gastroenterology care to [facility], Dr. Maryelizabeth Kaufmann for insurance reasons.  Please send a copy of today's office note to Dr. Maryelizabeth Kaufmann.   Please refill his apriso at 4 pills once daily, dispense 1 month with 3 refills.  He understands that since he is switching care we will not be prescribing any further medicines for him.

## 2018-08-26 NOTE — Telephone Encounter (Signed)
Pt just had virtual visit with Dr. Ardis Hughs, he forgot to ask about his lab results. Pls call him.

## 2018-08-26 NOTE — Telephone Encounter (Signed)
The patient has been notified of this information and all questions answered. The pt has been advised of the information and verbalized understanding.    

## 2018-08-26 NOTE — Telephone Encounter (Signed)
Pt has appt with you today. Ok to fill?

## 2018-08-27 DIAGNOSIS — K5000 Crohn's disease of small intestine without complications: Secondary | ICD-10-CM | POA: Insufficient documentation

## 2018-09-15 ENCOUNTER — Other Ambulatory Visit: Payer: Self-pay | Admitting: Internal Medicine

## 2018-10-28 ENCOUNTER — Telehealth: Payer: Self-pay | Admitting: Internal Medicine

## 2018-10-28 NOTE — Telephone Encounter (Signed)
Please schedule pt OV

## 2018-10-28 NOTE — Telephone Encounter (Signed)
DOXY appointment has been made for 7/6.

## 2018-10-28 NOTE — Telephone Encounter (Signed)
Pt states he would like to speak to Dr Alain Marion about Cialis or similar medication.  Pt states he tried it and it did not work well and he would like to discuss alternatives.  Please call pt: [phone].

## 2018-11-02 ENCOUNTER — Encounter: Payer: Self-pay | Admitting: Internal Medicine

## 2018-11-02 ENCOUNTER — Ambulatory Visit (INDEPENDENT_AMBULATORY_CARE_PROVIDER_SITE_OTHER): Payer: BLUE CROSS/BLUE SHIELD | Admitting: Internal Medicine

## 2018-11-02 DIAGNOSIS — K5080 Crohn's disease of both small and large intestine without complications: Secondary | ICD-10-CM | POA: Diagnosis not present

## 2018-11-02 DIAGNOSIS — E291 Testicular hypofunction: Secondary | ICD-10-CM

## 2018-11-02 DIAGNOSIS — N529 Male erectile dysfunction, unspecified: Secondary | ICD-10-CM | POA: Diagnosis not present

## 2018-11-02 DIAGNOSIS — E785 Hyperlipidemia, unspecified: Secondary | ICD-10-CM

## 2018-11-02 DIAGNOSIS — F419 Anxiety disorder, unspecified: Secondary | ICD-10-CM

## 2018-11-02 MED ORDER — VARDENAFIL HCL 20 MG PO TABS: 20.0000 mg | ORAL_TABLET | Freq: Every day | ORAL | 6 refills | Status: DC | PRN

## 2018-11-02 NOTE — Assessment & Plan Note (Signed)
May be contributing to his ED.

## 2018-11-02 NOTE — Assessment & Plan Note (Signed)
Obtain testosterone level again.  Lose weight

## 2018-11-02 NOTE — Assessment & Plan Note (Signed)
Follow-up with Dr. Ardis Hughs on On Stelara

## 2018-11-02 NOTE — Assessment & Plan Note (Addendum)
Cialis did not work.  Will try Levitra Obtain testosterone level

## 2018-11-02 NOTE — Progress Notes (Signed)
Virtual Visit via Video Note  I connected with Casey Kelly on 11/02/18 at  3:00 PM EDT by a video enabled telemedicine application and verified that I am speaking with the correct person using two identifiers.   I discussed the limitations of evaluation and management by telemedicine and the availability of in person appointments. The patient expressed understanding and agreed to proceed.  History of Present Illness: Casey Kelly is complaining of problems with Crohn's.  He will contact his gastroenterologist regarding continues treatment.  She is complaining of ED.  Cialis did not help.  He is worried about a problem with low testosterone.  There has been no runny nose, cough, chest pain, shortness of breath, constipation, arthralgias, skin rashes.  He has been feeling tired   Observations/Objective: The patient appears to be in no acute distress, looks well.  Assessment and Plan:  See my Assessment and Plan. Follow Up Instructions:    I discussed the assessment and treatment plan with the patient. The patient was provided an opportunity to ask questions and all were answered. The patient agreed with the plan and demonstrated an understanding of the instructions.   The patient was advised to call back or seek an in-person evaluation if the symptoms worsen or if the condition fails to improve as anticipated.  I provided face-to-face time during this encounter. We were at different locations.   Walker Kehr, MD

## 2018-11-09 ENCOUNTER — Other Ambulatory Visit (INDEPENDENT_AMBULATORY_CARE_PROVIDER_SITE_OTHER): Payer: BLUE CROSS/BLUE SHIELD

## 2018-11-09 DIAGNOSIS — K5080 Crohn's disease of both small and large intestine without complications: Secondary | ICD-10-CM

## 2018-11-09 DIAGNOSIS — E785 Hyperlipidemia, unspecified: Secondary | ICD-10-CM | POA: Diagnosis not present

## 2018-11-09 LAB — HEPATIC FUNCTION PANEL
ALT: 21 U/L (ref 0–53)
AST: 21 U/L (ref 0–37)
Albumin: 4.2 g/dL (ref 3.5–5.2)
Alkaline Phosphatase: 84 U/L (ref 39–117)
Bilirubin, Direct: 0.1 mg/dL (ref 0.0–0.3)
Total Bilirubin: 0.7 mg/dL (ref 0.2–1.2)
Total Protein: 7.7 g/dL (ref 6.0–8.3)

## 2018-11-09 LAB — BASIC METABOLIC PANEL
BUN: 15 mg/dL (ref 6–23)
CO2: 25 mEq/L (ref 19–32)
Calcium: 9.2 mg/dL (ref 8.4–10.5)
Chloride: 106 mEq/L (ref 96–112)
Creatinine, Ser: 1.08 mg/dL (ref 0.40–1.50)
GFR: 89.28 mL/min (ref >60.00)
Glucose, Bld: 95 mg/dL (ref 70–99)
Potassium: 4 mEq/L (ref 3.5–5.1)
Sodium: 142 mEq/L (ref 135–145)

## 2018-11-09 LAB — CBC WITH DIFFERENTIAL/PLATELET
Basophils Absolute: 0.1 10*3/uL (ref 0.0–0.1)
Basophils Relative: 1.3 % (ref 0.0–3.0)
Eosinophils Absolute: 0.4 10*3/uL (ref 0.0–0.7)
Eosinophils Relative: 6.9 % — ABNORMAL HIGH (ref 0.0–5.0)
HCT: 45.7 % (ref 39.0–52.0)
Hemoglobin: 14.8 g/dL (ref 13.0–17.0)
Lymphocytes Relative: 34.3 % (ref 12.0–46.0)
Lymphs Abs: 1.9 10*3/uL (ref 0.7–4.0)
MCHC: 32.5 g/dL (ref 30.0–36.0)
MCV: 83.6 fl (ref 78.0–100.0)
Monocytes Absolute: 0.4 10*3/uL (ref 0.1–1.0)
Monocytes Relative: 7.8 % (ref 3.0–12.0)
Neutro Abs: 2.7 10*3/uL (ref 1.4–7.7)
Neutrophils Relative %: 49.7 % (ref 43.0–77.0)
Platelets: 356 10*3/uL (ref 150.0–400.0)
RBC: 5.46 Mil/uL (ref 4.22–5.81)
RDW: 13.7 % (ref 11.5–15.5)
WBC: 5.5 10*3/uL (ref 4.0–10.5)

## 2018-11-09 LAB — TESTOSTERONE: Testosterone: 342.43 ng/dL (ref 300.00–890.00)

## 2018-11-09 LAB — TSH: TSH: 1.5 u[IU]/mL (ref 0.35–4.50)

## 2018-11-12 ENCOUNTER — Other Ambulatory Visit: Payer: Self-pay | Admitting: Internal Medicine

## 2018-11-18 ENCOUNTER — Telehealth: Payer: Self-pay | Admitting: Internal Medicine

## 2018-11-18 MED ORDER — VARDENAFIL HCL 20 MG PO TABS: 20.0000 mg | ORAL_TABLET | Freq: Every day | ORAL | 6 refills | Status: DC | PRN

## 2018-11-18 NOTE — Telephone Encounter (Signed)
Notified pt Md sent generic to pof.Marland KitchenJohny Chess

## 2018-11-18 NOTE — Telephone Encounter (Signed)
Done. Thx.

## 2018-11-18 NOTE — Telephone Encounter (Signed)
Medication Refill - Medication: vardenafil (LEVITRA) 20 MG tablet (Patient would like to know if a genereic version can be called in and would like a callback  Has the patient contacted their pharmacy?Yes (Agent: If no, request that the patient contact the pharmacy for the refill.) (Agent: If yes, when and what did the pharmacy advise?)Contact PCP  Preferred Pharmacy (with phone number or street name):  [id number] [phone](Phone) [phone](Fax)     Agent: Please be advised that RX refills may take up to 3 business days. We ask that you follow-up with your pharmacy.

## 2018-11-20 ENCOUNTER — Telehealth: Payer: Self-pay | Admitting: *Deleted

## 2018-11-20 NOTE — Telephone Encounter (Signed)
vardenafil 20 mg PA initiated via CoverMyMeds Key: [id number] Pt ID: [id number]  [facility]: [phone]

## 2019-01-19 ENCOUNTER — Telehealth: Payer: Self-pay

## 2019-01-19 NOTE — Telephone Encounter (Signed)
Copied from [place] [id number]. Topic: General - Inquiry >> Jan 19, 2019 10:25 AM Scherrie Gerlach wrote: Reason for CRM: pt is scheduled for flu and Tdap next week.  He would like to know if anything else you recommend he get to stay healthy since he has chrones Pt already has had 2 pna.

## 2019-01-20 NOTE — Telephone Encounter (Signed)
No, not now. He is up to date Thx

## 2019-01-21 NOTE — Telephone Encounter (Signed)
Pt.notified

## 2019-01-27 ENCOUNTER — Ambulatory Visit (INDEPENDENT_AMBULATORY_CARE_PROVIDER_SITE_OTHER): Payer: BLUE CROSS/BLUE SHIELD

## 2019-01-27 DIAGNOSIS — Z23 Encounter for immunization: Secondary | ICD-10-CM

## 2019-01-27 DIAGNOSIS — Z299 Encounter for prophylactic measures, unspecified: Secondary | ICD-10-CM

## 2019-02-09 ENCOUNTER — Telehealth: Payer: Self-pay | Admitting: Gastroenterology

## 2019-02-09 NOTE — Telephone Encounter (Signed)
Hi Dr. Ardis Hughs, this patient would like to transfer his GI care back to you. He stated that he lost his insurance at the beginning of the year so he had to go to [facility] but he has a new insurance now so he would like to see you again. Would you take patient back? Thank you.

## 2019-02-10 ENCOUNTER — Other Ambulatory Visit: Payer: Self-pay

## 2019-02-10 ENCOUNTER — Encounter: Payer: Self-pay | Admitting: Internal Medicine

## 2019-02-10 ENCOUNTER — Ambulatory Visit (INDEPENDENT_AMBULATORY_CARE_PROVIDER_SITE_OTHER): Payer: BC Managed Care – PPO | Admitting: Internal Medicine

## 2019-02-10 ENCOUNTER — Other Ambulatory Visit (INDEPENDENT_AMBULATORY_CARE_PROVIDER_SITE_OTHER): Payer: BC Managed Care – PPO

## 2019-02-10 DIAGNOSIS — E291 Testicular hypofunction: Secondary | ICD-10-CM

## 2019-02-10 DIAGNOSIS — K5080 Crohn's disease of both small and large intestine without complications: Secondary | ICD-10-CM

## 2019-02-10 DIAGNOSIS — N529 Male erectile dysfunction, unspecified: Secondary | ICD-10-CM | POA: Diagnosis not present

## 2019-02-10 DIAGNOSIS — M7661 Achilles tendinitis, right leg: Secondary | ICD-10-CM | POA: Diagnosis not present

## 2019-02-10 DIAGNOSIS — M7660 Achilles tendinitis, unspecified leg: Secondary | ICD-10-CM | POA: Insufficient documentation

## 2019-02-10 DIAGNOSIS — M7662 Achilles tendinitis, left leg: Secondary | ICD-10-CM

## 2019-02-10 LAB — BASIC METABOLIC PANEL
BUN: 15 mg/dL (ref 6–23)
CO2: 27 mEq/L (ref 19–32)
Calcium: 9.2 mg/dL (ref 8.4–10.5)
Chloride: 103 mEq/L (ref 96–112)
Creatinine, Ser: 1.09 mg/dL (ref 0.40–1.50)
GFR: 88.24 mL/min (ref >60.00)
Glucose, Bld: 93 mg/dL (ref 70–99)
Potassium: 4 mEq/L (ref 3.5–5.1)
Sodium: 139 mEq/L (ref 135–145)

## 2019-02-10 LAB — HEMOGLOBIN A1C: Hgb A1c MFr Bld: 6.1 % (ref 4.6–6.5)

## 2019-02-10 LAB — VITAMIN B12: Vitamin B-12: 406 pg/mL (ref 211–911)

## 2019-02-10 LAB — VITAMIN D 25 HYDROXY (VIT D DEFICIENCY, FRACTURES): VITD: 19.6 ng/mL — ABNORMAL LOW (ref 30.00–100.00)

## 2019-02-10 MED ORDER — STENDRA 100 MG PO TABS: 100.0000 mg | ORAL_TABLET | Freq: Every day | ORAL | 5 refills | Status: DC | PRN

## 2019-02-10 MED ORDER — VARDENAFIL HCL 20 MG PO TABS: 20.0000 mg | ORAL_TABLET | Freq: Every day | ORAL | 6 refills | Status: DC | PRN

## 2019-02-10 NOTE — Assessment & Plan Note (Signed)
Stelara [facility]

## 2019-02-10 NOTE — Patient Instructions (Addendum)
Ice Heel lifts 1/4 inch    These suggestions will probably help you to improve your metabolism if you are not overweight and to lose weight if you are overweight: 1.  Reduce your consumption of sugars and starches.  Eliminate high fructose corn syrup from your diet.  Reduce your consumption of processed foods.  For desserts try to have seasonal fruits, berries, nuts, cheeses or dark chocolate with more than 70% cacao. 2.  Do not snack 3.  You do not have to eat breakfast.  If you choose to have breakfast-eat plain greek yogurt, eggs, oatmeal (without sugar) 4.  Drink water, freshly brewed unsweetened tea (green, black or herbal) or coffee.  Do not drink sodas including diet sodas , juices, beverages sweetened with artificial sweeteners. 5.  Reduce your consumption of refined grains. 6.  Avoid protein drinks such as Optifast, Slim fast etc. Eat chicken, fish, meat, dairy and beans for your sources of protein 7.  Natural unprocessed fats like cold pressed virgin olive oil, butter, coconut oil are good for you.  Eat avocados 8.  Increase your consumption of fiber.  Fruits, berries, vegetables, whole grains, flaxseeds, Chia seeds, beans, popcorn, nuts, oatmeal are good sources of fiber 9.  Use vinegar in your diet, i.e. apple cider vinegar, red wine or balsamic vinegar 10.  You can try fasting.  For example you can skip breakfast and lunch every other day (24-hour fast) 11.  Stress reduction, good night sleep, relaxation, meditation, yoga and other physical activity is likely to help you to maintain low weight too. 12.  If you drink alcohol, limit your alcohol intake to no more than 2 drinks a day.     Cabbage soup recipe that will not make you gain weight: Take 1 small head of cabbage, 1 average pack of celery, 4 green peppers, 4 onions, 2 cans diced tomatoes (they are not available without salt), salt and spices to taste.  Chop cabbage, celery, peppers and onions.  And tomatoes and 2-2.5  liters (2.5 quarts) of water so that it would just cover the vegetables.  Bring to boil.  Add spices and salt.  Turn heat to low/medium and simmer for 20-25 minutes.  Naturally, you can make a smaller batch and change some of the ingredients.   There are natural ways to boost your testosterone:  1. Lose Weight If you're overweight, shedding the excess pounds may increase your testosterone levels, according to multiple research. Overweight men are more likely to have low testosterone levels to begin with, so this is an important trick to increase your body's testosterone production when you need it most.   2. Strength Training    Strength training is also known to boost testosterone levels, provided you are doing so intensely enough. When strength training to boost testosterone, you'll want to increase the weight and lower your number of reps, and then focus on exercises that work a large number of muscles.  3. Optimize Your Vitamin D Levels Vitamin D, a steroid hormone, is essential for the healthy development of the nucleus of the sperm cell, and helps maintain semen quality and sperm count. Vitamin D also increases levels of testosterone, which may boost libido. In one study, overweight men who were given vitamin D supplements had a significant increase in testosterone levels after one year.  4. Reduce Stress When you're under a lot of stress, your body releases high levels of the stress hormone cortisol. This hormone actually blocks the effects of testosterone, presumably  because, from a biological standpoint, testosterone-associated behaviors (mating, competing, aggression) may have lowered your chances of survival in an emergency (hence, the "fight or flight" response is dominant, courtesy of cortisol).  5. Limit or Eliminate Sugar from Your Diet Testosterone levels decrease after you eat sugar, which is likely because the sugar leads to a high insulin level, another factor leading to low  testosterone.  6. Eat Healthy Fats By healthy, this means not only mon- and polyunsaturated fats, like that found in avocadoes and nuts, but also saturated, as these are essential for building testosterone. Research shows that a diet with less than 40 percent of energy as fat (and that mainly from animal sources, i.e. saturated) lead to a decrease in testosterone levels.  It's important to understand that your body requires saturated fats from animal and vegetable sources (such as meat, dairy, certain oils, and tropical plants like coconut) for optimal functioning, and if you neglect this important food group in favor of sugar, grains and other starchy carbs, your health and weight are almost guaranteed to suffer. Examples of healthy fats you can eat more of to give your testosterone levels a boost include:  Olives and Olive oil  Coconuts and coconut oil Butter made from organic milk  Raw nuts, such as, almonds or pecans Eggs Avocados   Meats Palm oil Unheated organic nut oils   7. "Testosterone boosters" containing Vitamin D-3, Niacin, Vitamin B-6, Vitamin B-12, Magnesium, Zinc, Selenium, D-Aspartic Acid, Fenugreed Seed Extract, Oystershell, [place] Extract, [place] Ginseng may be helpful as well.   Achilles Tendinitis  Achilles tendinitis is inflammation of the tough, cord-like band that attaches the lower leg muscles to the heel bone (Achilles tendon). This is usually caused by overusing the tendon and the ankle joint. Achilles tendinitis usually gets better over time with treatment and caring for yourself at home. It can take weeks or months to heal completely. What are the causes? This condition may be caused by:  A sudden increase in exercise or activity, such as running.  Doing the same exercises or activities (such as jumping) over and over.  Not warming up calf muscles before exercising.  Exercising in shoes that are worn out or not made for exercise.  Having arthritis or a bone  growth (spur) on the back of the heel bone. This can rub against the tendon and hurt it.  Age-related wear and tear. Tendons become less flexible with age and more likely to be injured. What are the signs or symptoms? Common symptoms of this condition include:  Pain in the Achilles tendon or in the back of the leg, just above the heel. The pain usually gets worse with exercise.  Stiffness or soreness in the back of the leg, especially in the morning.  Swelling of the skin over the Achilles tendon.  Thickening of the tendon.  Bone spurs at the bottom of the Achilles tendon, near the heel.  Trouble standing on tiptoe. How is this diagnosed? This condition is diagnosed based on your symptoms and a physical exam. You may have tests, including:  X-rays.  MRI. How is this treated? The goal of treatment is to relieve symptoms and help your injury heal. Treatment may include:  Decreasing or stopping activities that caused the tendinitis. This may mean switching to low-impact exercises like biking or swimming.  Icing the injured area.  Doing physical therapy, including strengthening and stretching exercises.  NSAIDs to help relieve pain and swelling.  Using supportive shoes, wraps, heel lifts,  or a walking boot (air cast).  Surgery. This may be done if your symptoms do not improve after 6 months.  Using high-energy shock wave impulses to stimulate the healing process (extracorporeal shock wave therapy). This is rare.  Injection of medicines to help relieve inflammation (corticosteroids). This is rare. Follow these instructions at home: If you have an air cast:  Wear the cast as told by your health care provider. Remove it only as told by your health care provider.  Loosen the cast if your toes tingle, become numb, or turn cold and blue. Activity  Gradually return to your normal activities once your health care provider approves. Do not do activities that cause pain. ?  Consider doing low-impact exercises, like cycling or swimming.  If you have an air cast, ask your health care provider when it is safe for you to drive.  If physical therapy was prescribed, do exercises as told by your health care provider or physical therapist. Managing pain, stiffness, and swelling   Raise (elevate) your foot above the level of your heart while you are sitting or lying down.  Move your toes often to avoid stiffness and to lessen swelling.  If directed, put ice on the injured area: ? Put ice in a plastic bag. ? Place a towel between your skin and the bag. ? Leave the ice on for 20 minutes, 2-3 times a day General instructions  If directed, wrap your foot with an elastic bandage or other wrap. This can help keep your tendon from moving too much while it heals. Your health care provider will show you how to wrap your foot correctly.  Wear supportive shoes or heel lifts only as told by your health care provider.  Take over-the-counter and prescription medicines only as told by your health care provider.  Keep all follow-up visits as told by your health care provider. This is important. Contact a health care provider if:  You have symptoms that gets worse.  You have pain that does not get better with medicine.  You develop new, unexplained symptoms.  You develop warmth and swelling in your foot.  You have a fever. Get help right away if:  You have a sudden popping sound or sensation in your Achilles tendon followed by severe pain.  You cannot move your toes or foot.  You cannot put any weight on your foot. Summary  Achilles tendinitis is inflammation of the tough, cord-like band that attaches the lower leg muscles to the heel bone (Achilles tendon).  This condition is usually caused by overusing the tendon and the ankle joint. It can also be caused by arthritis or normal aging.  The most common symptoms of this condition include pain, swelling, or  stiffness in the Achilles tendon or in the back of the leg.  This condition is usually treated with rest, NSAIDs, and physical therapy. This information is not intended to replace advice given to you by your health care provider. Make sure you discuss any questions you have with your health care provider. Document Released: 01/23/2005 Document Revised: 03/28/2017 Document Reviewed: 03/04/2016 Elsevier Patient Education  2020 [employer].

## 2019-02-10 NOTE — Assessment & Plan Note (Signed)
Sports med ref [facility] loss Ice Heel lifts 1/4 inch

## 2019-02-10 NOTE — Assessment & Plan Note (Signed)
Normal testosterone off rx now

## 2019-02-10 NOTE — Assessment & Plan Note (Signed)
Normal testosterone off rx now [employer]

## 2019-02-10 NOTE — Progress Notes (Signed)
Subjective:  Patient ID: Casey Kelly, male    DOB: 12-10-1975  Age: 43 y.o. MRN: [id number]  CC: No chief complaint on file.   HPI VEDH PTACEK presents for the heel pain B x months C/o wt gain, obesity F/u Crohn's - up to 7 stools a day  Outpatient Medications Prior to Visit  Medication Sig Dispense Refill   amLODipine (NORVASC) 5 MG tablet Take 1 tablet (5 mg total) by mouth daily. 90 tablet 3   APRISO 0.375 g 24 hr capsule TAKE 4 CAPSULES(1.5 GRAMS) BY MOUTH DAILY 120 capsule 3   Cholecalciferol (VITAMIN D PO) Take by mouth.     diphenoxylate-atropine (LOMOTIL) 2.5-0.025 MG tablet TAKE 1 TABLET BY MOUTH 4 TIMES A DAY AS NEEDED FOR DIARRHEA OR LOOSE STOOLS. 120 tablet 0   metoprolol succinate (TOPROL-XL) 25 MG 24 hr tablet Take 1 tablet (25 mg total) by mouth daily. 90 tablet 3   ustekinumab (STELARA) 90 MG/ML SOSY injection Inject 1 mL (90 mg total) into the skin every 8 (eight) weeks. 1 Syringe 6   vardenafil (LEVITRA) 20 MG tablet Take 1 tablet (20 mg total) by mouth daily as needed for erectile dysfunction. 20 tablet 6   No facility-administered medications prior to visit.     ROS: Review of Systems  Constitutional: Positive for fatigue and unexpected weight change. Negative for appetite change.  HENT: Negative for congestion, nosebleeds, sneezing, sore throat and trouble swallowing.   Eyes: Negative for itching and visual disturbance.  Respiratory: Negative for cough.   Cardiovascular: Negative for chest pain, palpitations and leg swelling.  Gastrointestinal: Positive for blood in stool and diarrhea. Negative for abdominal distention and nausea.  Genitourinary: Negative for frequency and hematuria.  Musculoskeletal: Positive for arthralgias and gait problem. Negative for back pain, joint swelling and neck pain.  Skin: Negative for rash.  Neurological: Negative for dizziness, tremors, speech difficulty and weakness.  Psychiatric/Behavioral: Negative for  agitation, dysphoric mood, sleep disturbance and suicidal ideas. The patient is not nervous/anxious.     Objective:  BP (!) 144/94 (BP Location: Left Arm, Patient Position: Sitting, Cuff Size: Large)    Pulse 70    Temp 98.1 F (36.7 C) (Oral)    Ht 6' 3"  (1.905 m)    Wt 293 lb (132.9 kg)    SpO2 97%    BMI 36.62 kg/m   BP Readings from Last 3 Encounters:  02/10/19 (!) 144/94  08/26/18 120/76  05/26/18 120/76    Wt Readings from Last 3 Encounters:  02/10/19 293 lb (132.9 kg)  08/26/18 289 lb (131.1 kg)  05/26/18 289 lb (131.1 kg)    Physical Exam Constitutional:      General: He is not in acute distress.    Appearance: He is well-developed. He is obese.     Comments: NAD  Eyes:     Conjunctiva/sclera: Conjunctivae normal.     Pupils: Pupils are equal, round, and reactive to light.  Neck:     Musculoskeletal: Normal range of motion.     Thyroid: No thyromegaly.     Vascular: No JVD.  Cardiovascular:     Rate and Rhythm: Normal rate and regular rhythm.     Heart sounds: Normal heart sounds. No murmur. No friction rub. No gallop.   Pulmonary:     Effort: Pulmonary effort is normal. No respiratory distress.     Breath sounds: Normal breath sounds. No wheezing or rales.  Chest:     Chest wall: No  tenderness.  Abdominal:     General: Bowel sounds are normal. There is no distension.     Palpations: Abdomen is soft. There is no mass.     Tenderness: There is no abdominal tenderness. There is no guarding or rebound.  Musculoskeletal: Normal range of motion.        General: Tenderness present.  Lymphadenopathy:     Cervical: No cervical adenopathy.  Skin:    General: Skin is warm and dry.     Findings: No rash.  Neurological:     Mental Status: He is alert and oriented to person, place, and time.     Cranial Nerves: No cranial nerve deficit.     Motor: No abnormal muscle tone.     Coordination: Coordination normal.     Gait: Gait normal.     Deep Tendon Reflexes:  Reflexes are normal and symmetric.  Psychiatric:        Behavior: Behavior normal.        Thought Content: Thought content normal.        Judgment: Judgment normal.     Lab Results  Component Value Date   WBC 5.5 11/09/2018   HGB 14.8 11/09/2018   HCT 45.7 11/09/2018   PLT 356.0 11/09/2018   GLUCOSE 95 11/09/2018   CHOL 213 (H) 05/21/2017   TRIG 80.0 05/21/2017   HDL 39.80 05/21/2017   LDLDIRECT 196.0 12/08/2007   LDLCALC 157 (H) 05/21/2017   ALT 21 11/09/2018   AST 21 11/09/2018   NA 142 11/09/2018   K 4.0 11/09/2018   CL 106 11/09/2018   CREATININE 1.08 11/09/2018   BUN 15 11/09/2018   CO2 25 11/09/2018   TSH 1.50 11/09/2018   PSA 0.23 10/13/2017   INR 0.9 10/10/2010   HGBA1C 5.8 05/21/2017    US Abdomen Complete  Result Date: 01/07/2018 CLINICAL DATA:  Acute onset of RIGHT UPPER and LOWER QUADRANT abdominal pain. Current history of Crohn's disease. Personal history of urinary tract calculi. EXAM: ABDOMEN ULTRASOUND COMPLETE COMPARISON:  MRI abdomen and pelvis 12/18/2017. CT abdomen and pelvis 10/13/2017, 07/04/2015. FINDINGS: Gallbladder: No shadowing gallstones or echogenic sludge. No gallbladder wall thickening or pericholecystic fluid. Negative sonographic Murphy sign according to the ultrasound technologist. Common bile duct: Diameter: Approximately 5 mm. Liver: No focal lesion identified. Within normal limits in parenchymal echogenicity. Portal vein is patent on color Doppler imaging with normal direction of blood flow towards the liver. IVC: Patent. Pancreas: While difficult to visualize in its entirety, visualized portions normal in appearance. The distal body and tail are obscured by overlying bowel gas. The pancreas had a normal appearance on the CT 3 months ago. Spleen: Normal size and echotexture without focal parenchymal abnormality. Right Kidney: Length: Approximately 12.2 cm. No hydronephrosis. Well-preserved cortex. Normal parenchymal echotexture. Shadowing 6 mm  calculus in a mid calyx as noted on the recent CT. No focal parenchymal abnormality. Left Kidney: Length: Approximately 12.2 cm. No hydronephrosis. Well-preserved cortex. No shadowing calculi. Normal parenchymal echotexture. No focal parenchymal abnormality. Abdominal aorta: Normal in caliber throughout its visualized course in the abdomen without evidence of significant atherosclerosis. Maximum diameter 2.0 cm. Other findings: None. IMPRESSION: 1. Non-obstructing 6 mm calculus in a mid calyx of the RIGHT kidney, unchanged from the CT 10/13/2017. 2. Normal examination otherwise with a caveat that the pancreatic tail is obscured by overlying bowel gas and was therefore not evaluated (though the pancreas had a normal appearance on the prior CT). Electronically Signed   By: Marcello Moores  Lawrence M.D.   On: 01/07/2018 17:17    Assessment & Plan:   There are no diagnoses linked to this encounter.   No orders of the defined types were placed in this encounter.    Follow-up: No follow-ups on file.  Walker Kehr, MD

## 2019-02-11 ENCOUNTER — Encounter: Payer: Self-pay | Admitting: Family Medicine

## 2019-02-11 ENCOUNTER — Other Ambulatory Visit: Payer: Self-pay | Admitting: Internal Medicine

## 2019-02-11 ENCOUNTER — Ambulatory Visit: Payer: Self-pay

## 2019-02-11 ENCOUNTER — Ambulatory Visit: Payer: BC Managed Care – PPO | Admitting: Family Medicine

## 2019-02-11 VITALS — BP 152/90 | HR 83 | Ht 75.0 in | Wt 295.0 lb

## 2019-02-11 DIAGNOSIS — M25572 Pain in left ankle and joints of left foot: Secondary | ICD-10-CM | POA: Diagnosis not present

## 2019-02-11 DIAGNOSIS — M7662 Achilles tendinitis, left leg: Secondary | ICD-10-CM

## 2019-02-11 DIAGNOSIS — M25571 Pain in right ankle and joints of right foot: Secondary | ICD-10-CM | POA: Diagnosis not present

## 2019-02-11 DIAGNOSIS — G8929 Other chronic pain: Secondary | ICD-10-CM

## 2019-02-11 DIAGNOSIS — M7661 Achilles tendinitis, right leg: Secondary | ICD-10-CM | POA: Diagnosis not present

## 2019-02-11 MED ORDER — PENNSAID 2 % TD SOLN: 2.0000 g | Freq: Two times a day (BID) | TRANSDERMAL | 3 refills | Status: DC

## 2019-02-11 MED ORDER — VITAMIN D3 50 MCG (2000 UT) PO CAPS: 2000.0000 [IU] | ORAL_CAPSULE | Freq: Every day | ORAL | 3 refills | Status: AC

## 2019-02-11 MED ORDER — VITAMIN D3 1.25 MG (50000 UT) PO CAPS: 1.0000 | ORAL_CAPSULE | ORAL | 0 refills | Status: DC

## 2019-02-11 NOTE — Progress Notes (Signed)
[facility] [address] [place], [place] [address] Phone: [phone] Subjective:   I Casey Kelly am serving as a Education administrator for Dr. Hulan Saas.  I'm seeing this patient by the request  of:  Plotnikov, Evie Lacks, MD   CC: right ankle  [id number]  Casey Kelly is a 43 y.o. male coming in with complaint of bilateral ankle pain. Patient was seen by another provider for achilles tendonitis. Patient states he has been experiencing pain for about 6 months. Walking is painful.  8-9/10 . Achy, sharp, throbbing, burning pain. Pain is altering his lifestyle.      Past Medical History:  Diagnosis Date  . Allergic rhinitis   . Bruxism   . Crohn's disease ([place])    no current med.  [name] History of chronic sinusitis   . History of kidney stones   . History of kidney stones   . Hypertension   . Hypogonadism male   . Left ureteral stone   . Mild obstructive sleep apnea    per study 10-05-2009--  INTERMITTANT CPAP USE  . Nephrolithiasis    bilateral  non-obstructive per urologist note  . Plantar fasciitis 2010   bilateral  . Sleep apnea    CPAP use  . Wears contact lenses    Past Surgical History:  Procedure Laterality Date  . ADENOIDECTOMY  as child  . COLONOSCOPY  2012  . CYSTOSCOPY/URETEROSCOPY/HOLMIUM LASER/STENT PLACEMENT Bilateral 08/15/2015   Procedure: CYSTOSCOPY/ LEFT URETEROSCOPY, POSSIBLE RIGHT URETEROSCOPY [place] LASER/STENT PLACEMENT;  Surgeon: Nickie Retort, MD;  Location: [facility];  Service: Urology;  Laterality: Bilateral;  . ETHMOIDECTOMY Bilateral 07/17/2009   anterior endoscopic , polypectomy, and bilateral maxillay antrostomy  . HAND SURGERY Right    fingertip amputation  . HOLMIUM LASER APPLICATION Bilateral 4/62/7035   Procedure: HOLMIUM LASER APPLICATION;  Surgeon: Nickie Retort, MD;  Location: [facility];  Service: Urology;  Laterality: Bilateral;  . NASAL SINUS SURGERY  2016   at  [place]  as child   Social History   Socioeconomic History  . Marital status: Married    Spouse name: Not on file  . Number of children: 3  . Years of education: Not on file  . Highest education level: Not on file  Occupational History  . Occupation: Metallurgist: Proofreader  . Occupation: English as a second language teacher: [employer]  . Financial resource strain: Not on file  . Food insecurity    Worry: Not on file    Inability: Not on file  . Transportation needs    Medical: Not on file    Non-medical: Not on file  Tobacco Use  . Smoking status: Never Smoker  . Smokeless tobacco: Never Used  Substance and Sexual Activity  . Alcohol use: No  . Drug use: No  . Sexual activity: Yes  Lifestyle  . Physical activity    Days per week: Not on file    Minutes per session: Not on file  . Stress: Not on file  Relationships  . Social Herbalist on phone: Not on file    Gets together: Not on file    Attends religious service: Not on file    Active member of club or organization: Not on file    Attends meetings of clubs or organizations: Not on file    Relationship status: Not on  file  Other Topics Concern  . Not on file  Social History Narrative   Regular exercise- No   1 Caffeine drink daily    Allergies  Allergen Reactions  . Amlodipine     edema   Family History  Problem Relation Age of Onset  . Allergic rhinitis Mother   . Breast cancer Mother   . Lung cancer Father 25  . Colon polyps Father   . Prostate cancer Father   . Allergic rhinitis Daughter   . Allergic rhinitis Son   . Asthma Son   . Colon cancer Neg Hx      Current Outpatient Medications (Cardiovascular):  .  amLODipine (NORVASC) 5 MG tablet, Take 1 tablet (5 mg total) by mouth daily. .  Avanafil (STENDRA) 100 MG TABS, Take 100-200 mg by mouth daily as needed. .  metoprolol succinate (TOPROL-XL) 25 MG 24 hr tablet, Take 1 tablet  (25 mg total) by mouth daily.     Current Outpatient Medications (Other):  [name]  APRISO 0.375 g 24 hr capsule, TAKE 4 CAPSULES(1.5 GRAMS) BY MOUTH DAILY .  Cholecalciferol (VITAMIN D3) 1.25 MG (50000 UT) CAPS, Take 1 capsule by mouth once a week. .  Cholecalciferol (VITAMIN D3) 50 MCG (2000 UT) capsule, Take 1 capsule (2,000 Units total) by mouth daily. .  diphenoxylate-atropine (LOMOTIL) 2.5-0.025 MG tablet, TAKE 1 TABLET BY MOUTH 4 TIMES A DAY AS NEEDED FOR DIARRHEA OR LOOSE STOOLS. [name]  ustekinumab (STELARA) 90 MG/ML SOSY injection, Inject 1 mL (90 mg total) into the skin every 8 (eight) weeks. .  Diclofenac Sodium (PENNSAID) 2 % SOLN, Place 2 g onto the skin 2 (two) times daily.    Past medical history, social, surgical and family history all reviewed in electronic medical record.  No pertanent information unless stated regarding to the chief complaint.   Review of Systems:  No headache, visual changes, nausea, vomiting, diarrhea, constipation, dizziness, abdominal pain, skin rash, fevers, chills, night sweats, weight loss, swollen lymph nodes, body aches, joint swelling, chest pain, shortness of breath, mood changes.  Positive muscle aches  Objective  Blood pressure (!) 152/90, pulse 83, height 6' 3"  (1.905 m), weight 295 lb (133.8 kg), SpO2 98 %.   General: No apparent distress alert and oriented x3 mood and affect normal, dressed appropriately.  Overweight HEENT: Pupils equal, extraocular movements intact  Respiratory: Patient's speak in full sentences and does not appear short of breath  Cardiovascular: No lower extremity edema, non tender, no erythema  Skin: Warm dry intact with no signs of infection or rash on extremities or on axial skeleton.  Abdomen: Soft nontender  Neuro: Cranial nerves II through XII are intact, neurovascularly intact in all extremities with 2+ DTRs and 2+ pulses.  Lymph: No lymphadenopathy of posterior or anterior cervical chain or axillae bilaterally.  Gait  normal with good balance and coordination.  MSK:  Non tender with full range of motion and good stability and symmetric strength and tone of shoulders, elbows, wrist, hip, knee  bilaterally.  Ankle: Bilateral Swelling noted nearing the insertion of the Achilles Range of motion is full in all directions. Strength is 5/5 in all directions. Stable lateral and medial ligaments; squeeze test and kleiger test unremarkable; Talar dome nontender; No pain at base of 5th MT; No tenderness over cuboid; No tenderness over N spot or navicular prominence No tenderness on posterior aspects of lateral and medial malleolus No sign of peroneal tendon subluxations or tenderness to palpation Negative tarsal tunnel tinel's  Tender over the Achilles bilaterally left greater than right Able to walk 4 steps. Pes planus of the feet bilaterally overpronation noted  MSK US performed of: Bilateral ankle This study was ordered, performed, and interpreted by Charlann Boxer D.O.  Foot/Ankle:   Achilles tendon at the insertion does have some calcific changes and some mild increased Doppler flow.  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  This included exercises focusing on stretching, strengthening, with significant focus on eccentric aspects.   Long term goals include an improvement in range of motion, strength, endurance as well as avoiding reinjury. Patient's frequency would include in 1-2 times a day, 3-5 times a week for a duration of 6-12 weeks.  Ankle strengthening that included:  Basic range of motion exercises to allow proper full motion at ankle Stretching of the lower leg and hamstrings  Theraband exercises for the lower leg - inversion, eversion, dorsiflexion and plantarflexion each to be completed with a theraband Balance exercises to increase proprioception Weight  bearing exercises to increase strength and balance Proper technique shown and discussed handout in great detail with ATC.  All questions were discussed and answered.     Impression and Recommendations:     This case required medical decision making of moderate complexity. The above documentation has been reviewed and is accurate and complete Lyndal Pulley, DO       Note: This dictation was prepared with Dragon dictation along with smaller phrase technology. Any transcriptional errors that result from this process are unintentional.

## 2019-02-11 NOTE — Patient Instructions (Addendum)
Good to see you.  Ice 20 minutes 2 times daily. Usually after activity and before bed. Exercises 3 times a week.  pennsaid pinkie amount topically 2 times daily as needed.  Heel lift 1/8-1/16th of an inch in both shoes Avoid being bearfoot in house Plantar wart cream daily   Turmeric 524m 3 times a week Tart cherry extract 12016mat night Vitamin D 2000 IU daily  See me again in 6-8 weeks

## 2019-02-11 NOTE — Assessment & Plan Note (Signed)
Bilateral, new problem for this individual, discussed vitamin D supplementation, discussed the dosing regimen, discussed which activities.  Patient avoid.  Discussed Feliz, prescribed topical anti-inflammatories.  Follow-up with me again in 4 to 6 weeks

## 2019-02-19 NOTE — Telephone Encounter (Signed)
He has complicated Crohn's disease that I have struggled to get under control and I think he is best served staying at [facility] with Dr. Renee Harder.

## 2019-02-19 NOTE — Telephone Encounter (Signed)
Called pt and gave him Dr. Ardis Hughs' message.

## 2019-03-22 NOTE — Progress Notes (Signed)
No show

## 2019-03-23 ENCOUNTER — Ambulatory Visit: Payer: BC Managed Care – PPO | Admitting: Family Medicine

## 2019-03-24 ENCOUNTER — Encounter: Payer: Self-pay | Admitting: Family Medicine

## 2019-04-11 IMAGING — US US ABDOMEN COMPLETE
1 series · 13 of 25 positions shown · non-contrast
Comparison: MRI abdomen and pelvis 12/18/2017. CT abdomen and
pelvis 10/13/2017, 07/04/2015.

CLINICAL DATA: Acute onset of RIGHT UPPER and LOWER QUADRANT
abdominal pain. Current history of Crohn's disease. Personal history
of urinary tract calculi.

EXAM:
ABDOMEN ULTRASOUND COMPLETE

[Series 1: us abdomen complete · 13 of 96 slices shown]
[im 1/96]
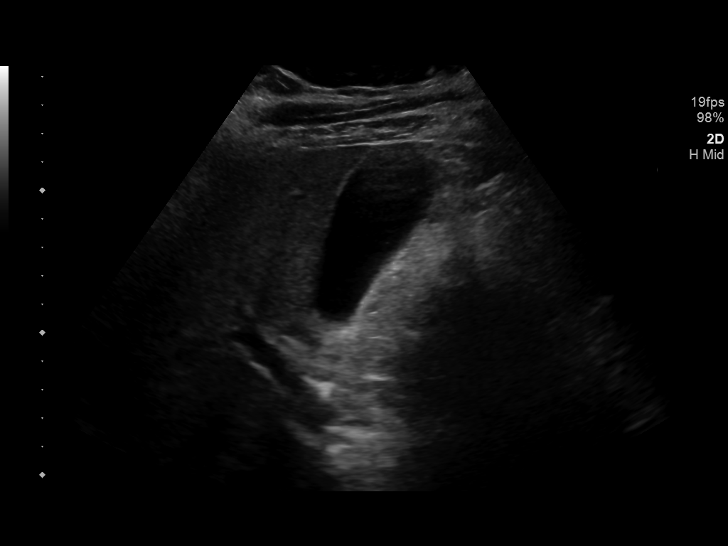
[im 8/96]
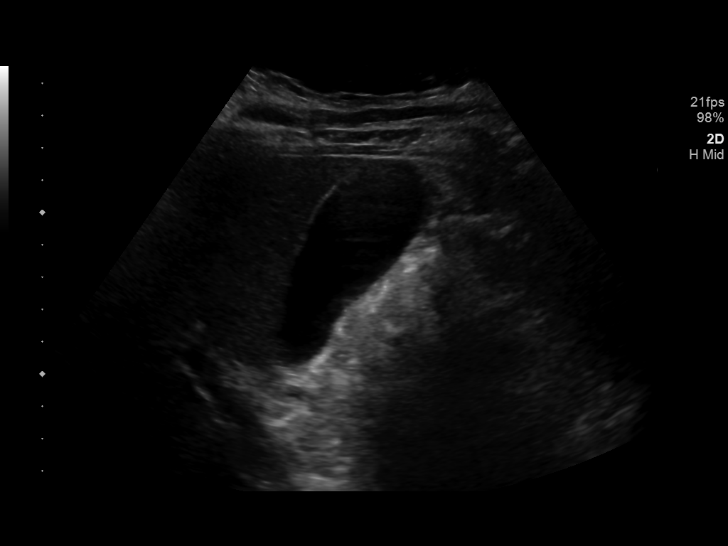
[im 16/96]
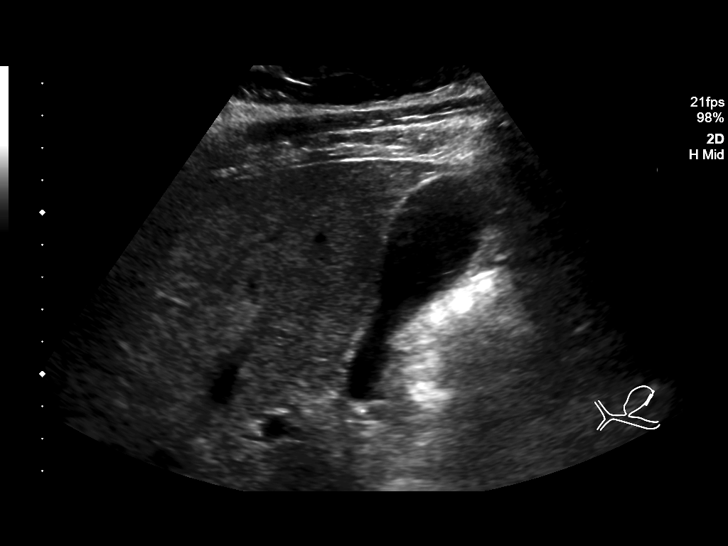
[im 24/96]
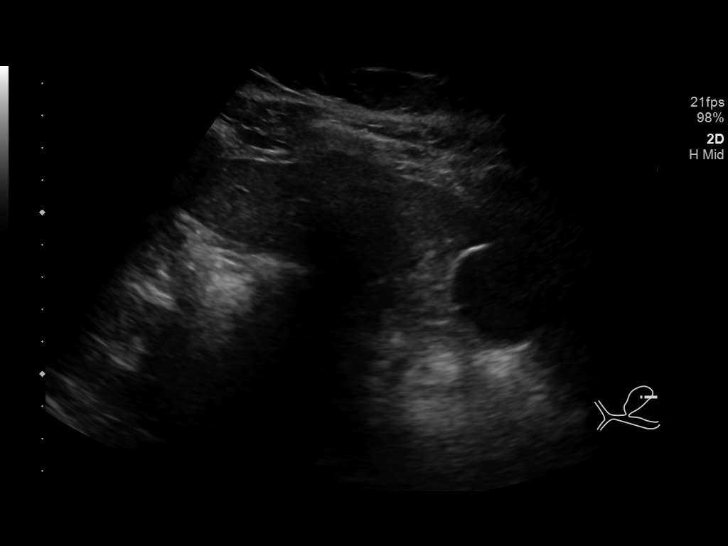
[im 32/96]
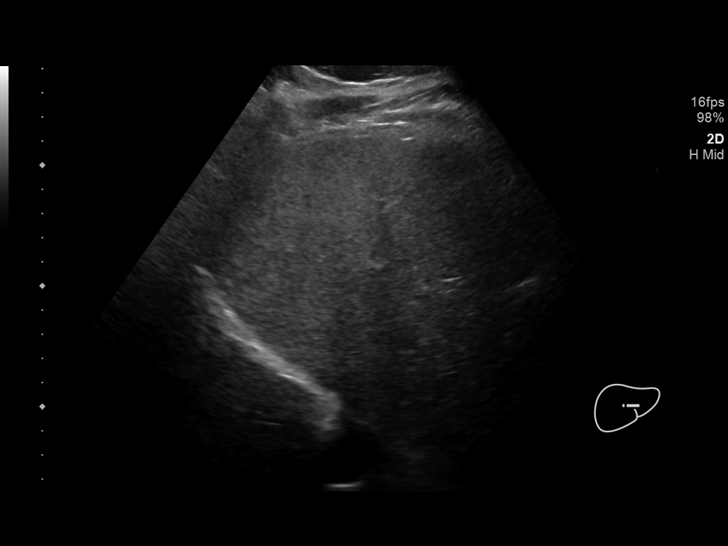
[im 40/96]
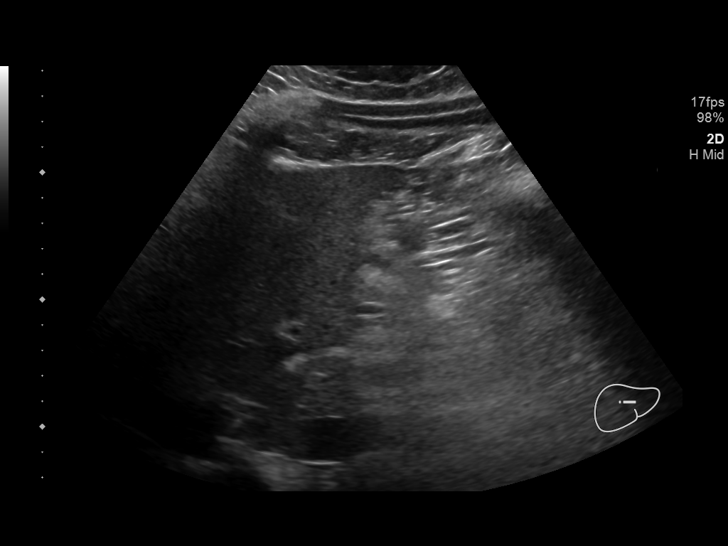
[im 48/96]
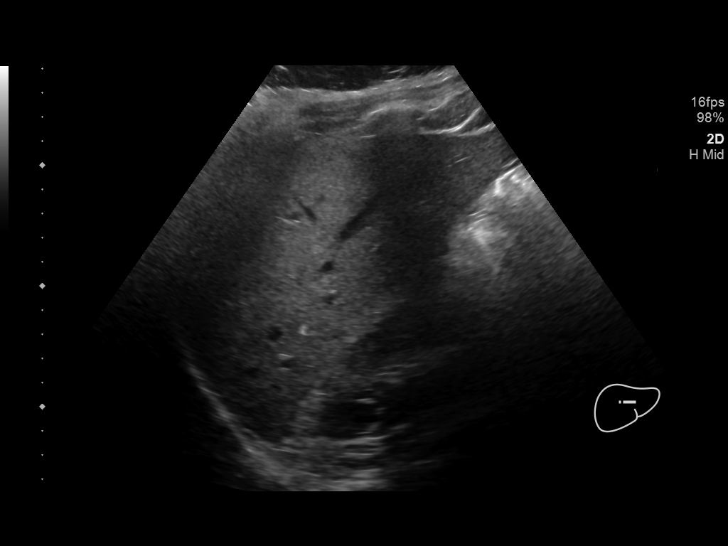
[im 56/96]
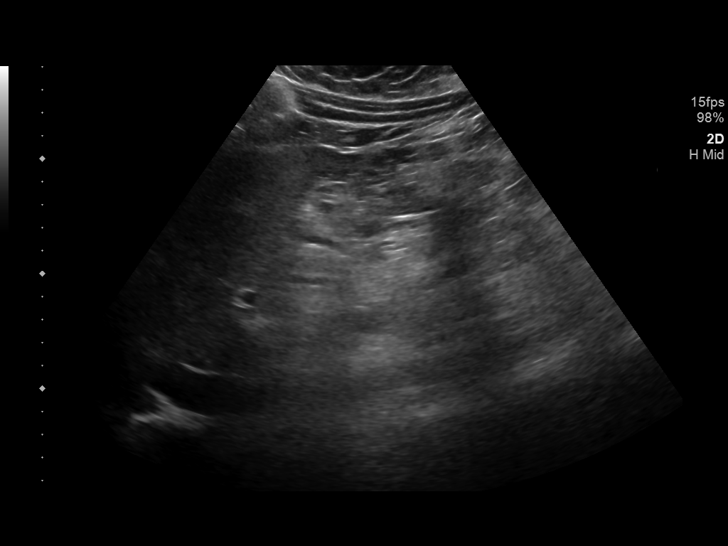
[im 64/96]
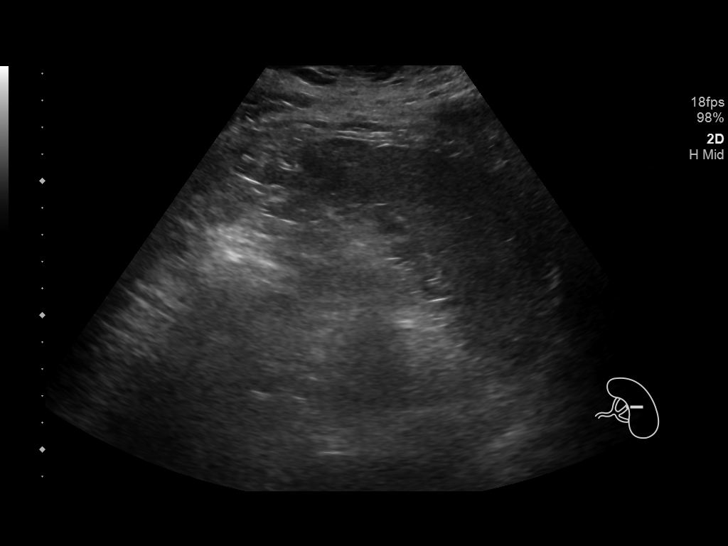
[im 72/96]
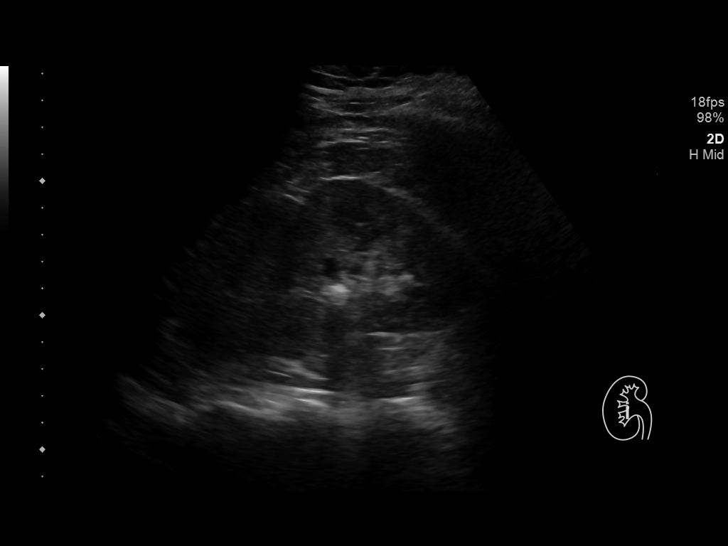
[im 80/96]
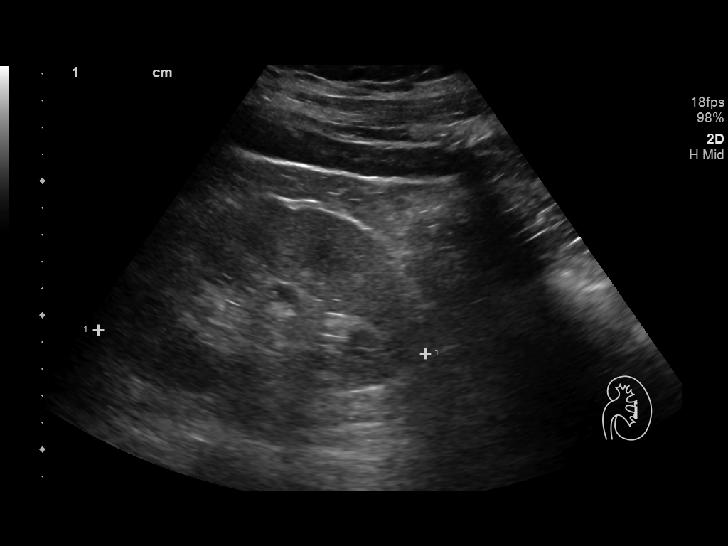
[im 88/96]
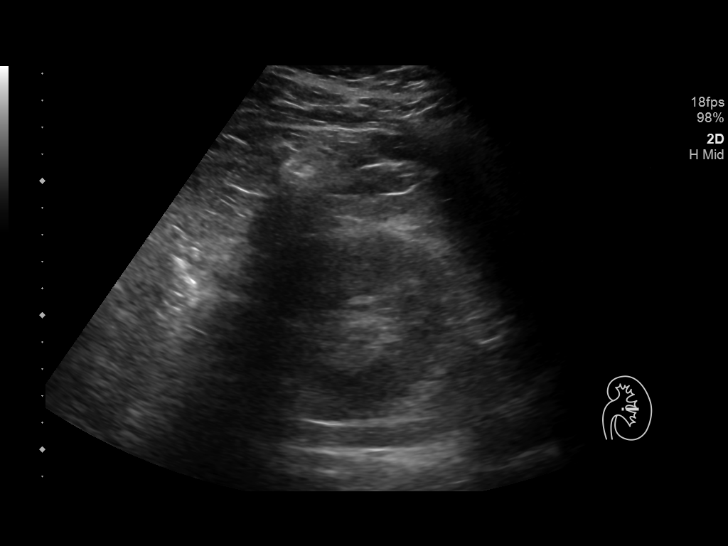
[im 96/96]
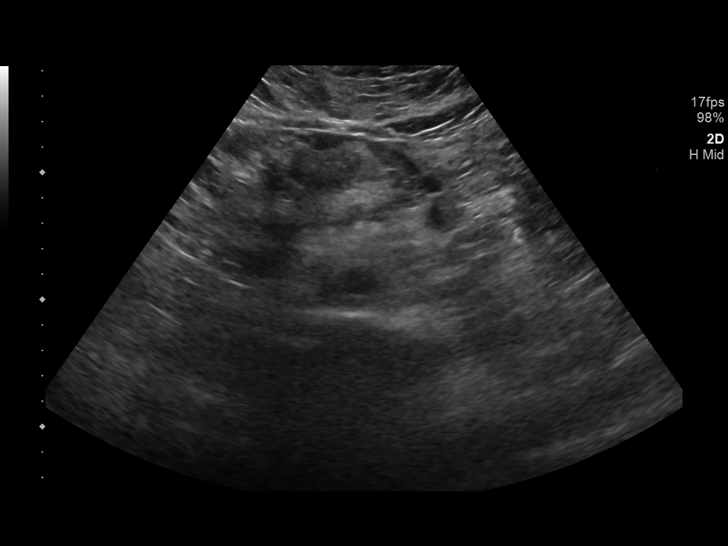

[13 of 25 positions shown; findings below may reference images not displayed]

FINDINGS: Gallbladder: No shadowing gallstones or echogenic sludge. No
gallbladder wall thickening or pericholecystic fluid. Negative
sonographic Murphy sign according to the ultrasound technologist.

Common bile duct: Diameter: Approximately 5 mm.

Liver: No focal lesion identified. Within normal limits in
parenchymal echogenicity. Portal vein is patent on color Doppler
imaging with normal direction of blood flow towards the liver.

IVC: Patent.

Pancreas: While difficult to visualize in its entirety, visualized
portions normal in appearance. The distal body and tail are obscured
by overlying bowel gas. The pancreas had a normal appearance on the
CT 3 months ago.

Spleen: Normal size and echotexture without focal parenchymal
abnormality.

Right Kidney: Length: Approximately 12.2 cm. No hydronephrosis.
Well-preserved cortex. Normal parenchymal echotexture. Shadowing 6
mm calculus in a mid calyx as noted on the recent CT. No focal
parenchymal abnormality.

Left Kidney: Length: Approximately 12.2 cm. No hydronephrosis.
Well-preserved cortex. No shadowing calculi. Normal parenchymal
echotexture. No focal parenchymal abnormality.

Abdominal aorta: Normal in caliber throughout its visualized course
in the abdomen without evidence of significant atherosclerosis.
Maximum diameter 2.0 cm.

Other findings: None.
IMPRESSION: 1. Non-obstructing 6 mm calculus in a mid calyx of the RIGHT kidney,
unchanged from the CT 10/13/2017.
[DATE]. Normal examination otherwise with a caveat that the pancreatic
tail is obscured by overlying bowel gas and was therefore not
evaluated (though the pancreas had a normal appearance on the prior
CT).

## 2019-08-07 ENCOUNTER — Ambulatory Visit: Payer: BC Managed Care – PPO

## 2019-08-17 DIAGNOSIS — K50111 Crohn's disease of large intestine with rectal bleeding: Secondary | ICD-10-CM | POA: Insufficient documentation

## 2019-09-24 ENCOUNTER — Other Ambulatory Visit: Payer: Self-pay | Admitting: Internal Medicine

## 2019-11-22 ENCOUNTER — Ambulatory Visit: Payer: Managed Care, Other (non HMO) | Admitting: Internal Medicine

## 2019-11-22 ENCOUNTER — Other Ambulatory Visit: Payer: Self-pay

## 2019-11-22 ENCOUNTER — Encounter: Payer: Self-pay | Admitting: Internal Medicine

## 2019-11-22 DIAGNOSIS — R739 Hyperglycemia, unspecified: Secondary | ICD-10-CM | POA: Diagnosis not present

## 2019-11-22 DIAGNOSIS — N529 Male erectile dysfunction, unspecified: Secondary | ICD-10-CM

## 2019-11-22 DIAGNOSIS — I10 Essential (primary) hypertension: Secondary | ICD-10-CM | POA: Diagnosis not present

## 2019-11-22 DIAGNOSIS — K5080 Crohn's disease of both small and large intestine without complications: Secondary | ICD-10-CM | POA: Diagnosis not present

## 2019-11-22 DIAGNOSIS — R197 Diarrhea, unspecified: Secondary | ICD-10-CM

## 2019-11-22 DIAGNOSIS — F5102 Adjustment insomnia: Secondary | ICD-10-CM

## 2019-11-22 MED ORDER — DIPHENOXYLATE-ATROPINE 2.5-0.025 MG PO TABS: ORAL_TABLET | ORAL | 0 refills | Status: DC

## 2019-11-22 MED ORDER — ZOLPIDEM TARTRATE 10 MG PO TABS: 5.0000 mg | ORAL_TABLET | Freq: Every evening | ORAL | 3 refills | Status: DC | PRN

## 2019-11-22 MED ORDER — ZENPEP 40000-126000 UNITS PO CPEP: 1.0000 | ORAL_CAPSULE | Freq: Three times a day (TID) | ORAL | 3 refills | Status: DC

## 2019-11-22 MED ORDER — STENDRA 100 MG PO TABS: 100.0000 mg | ORAL_TABLET | Freq: Every day | ORAL | 5 refills | Status: DC | PRN

## 2019-11-22 NOTE — Patient Instructions (Signed)
Activated charcoal capsules for gas

## 2019-11-22 NOTE — Assessment & Plan Note (Addendum)
on Entyvio Lomotil prn No help w/gluten free diet Activated charcoal capsules for gas Try Zenpep

## 2019-11-22 NOTE — Assessment & Plan Note (Signed)
Stendra prn

## 2019-11-22 NOTE — Assessment & Plan Note (Signed)
Amlodipine, Toprol , Hytrin

## 2019-11-22 NOTE — Assessment & Plan Note (Signed)
Worse On CPAP Zolpidem prn - rare

## 2019-11-22 NOTE — Progress Notes (Signed)
Subjective:  Patient ID: Casey Kelly, male    DOB: 1975/05/03  Age: 44 y.o. MRN: [id number]  CC: No chief complaint on file.   HPI Casey Kelly presents for Crohn's - worse C/o diarrhea after meals  C/o bad insomnia, OSA - on CPAP  Outpatient Medications Prior to Visit  Medication Sig Dispense Refill  . amLODipine (NORVASC) 5 MG tablet Take 1 tablet (5 mg total) by mouth daily. Annual appt due in July must see provider for future refills 90 tablet 0  . Avanafil (STENDRA) 100 MG TABS Take 100-200 mg by mouth daily as needed. 12 tablet 5  . Cholecalciferol (VITAMIN D3) 1.25 MG (50000 UT) CAPS Take 1 capsule by mouth once a week. 8 capsule 0  . Cholecalciferol (VITAMIN D3) 50 MCG (2000 UT) capsule Take 1 capsule (2,000 Units total) by mouth daily. 100 capsule 3  . Diclofenac Sodium (PENNSAID) 2 % SOLN Place 2 g onto the skin 2 (two) times daily. 112 g 3  . diphenoxylate-atropine (LOMOTIL) 2.5-0.025 MG tablet TAKE 1 TABLET BY MOUTH 4 TIMES A DAY AS NEEDED FOR DIARRHEA OR LOOSE STOOLS. 120 tablet 0  . metoprolol succinate (TOPROL-XL) 25 MG 24 hr tablet Take 1 tablet (25 mg total) by mouth daily. Annual appt due in July must see provider for future refills 90 tablet 0  . Vedolizumab (ENTYVIO IV) Inject into the vein.    . APRISO 0.375 g 24 hr capsule TAKE 4 CAPSULES(1.5 GRAMS) BY MOUTH DAILY (Patient not taking: Reported on 11/22/2019) 120 capsule 3  . ustekinumab (STELARA) 90 MG/ML SOSY injection Inject 1 mL (90 mg total) into the skin every 8 (eight) weeks. (Patient not taking: Reported on 11/22/2019) 1 Syringe 6   No facility-administered medications prior to visit.    ROS: Review of Systems  Gastrointestinal: Positive for diarrhea.    Objective:  BP (!) 128/86 (BP Location: Left Arm, Patient Position: Sitting, Cuff Size: Large)   Pulse 90   Temp 98.9 F (37.2 C) (Oral)   Ht 6' 3"  (1.905 m)   Wt (!) 295 lb (133.8 kg)   SpO2 97%   BMI 36.87 kg/m   BP Readings from Last 3  Encounters:  11/22/19 (!) 128/86  02/11/19 (!) 152/90  02/10/19 (!) 144/94    Wt Readings from Last 3 Encounters:  11/22/19 (!) 295 lb (133.8 kg)  02/11/19 295 lb (133.8 kg)  02/10/19 293 lb (132.9 kg)    Physical Exam Constitutional:      General: He is not in acute distress.    Appearance: He is well-developed. He is obese.     Comments: NAD  Eyes:     Conjunctiva/sclera: Conjunctivae normal.     Pupils: Pupils are equal, round, and reactive to light.  Neck:     Thyroid: No thyromegaly.     Vascular: No JVD.  Cardiovascular:     Rate and Rhythm: Normal rate and regular rhythm.     Heart sounds: Normal heart sounds. No murmur heard.  No friction rub. No gallop.   Pulmonary:     Effort: Pulmonary effort is normal. No respiratory distress.     Breath sounds: Normal breath sounds. No wheezing or rales.  Chest:     Chest wall: No tenderness.  Abdominal:     General: Bowel sounds are normal. There is no distension.     Palpations: Abdomen is soft. There is no mass.     Tenderness: There is no abdominal tenderness. There  is no guarding or rebound.  Musculoskeletal:        General: No tenderness. Normal range of motion.     Cervical back: Normal range of motion.  Lymphadenopathy:     Cervical: No cervical adenopathy.  Skin:    General: Skin is warm and dry.     Findings: No rash.  Neurological:     Mental Status: He is alert and oriented to person, place, and time.     Cranial Nerves: No cranial nerve deficit.     Motor: No abnormal muscle tone.     Coordination: Coordination normal.     Gait: Gait normal.     Deep Tendon Reflexes: Reflexes are normal and symmetric.  Psychiatric:        Behavior: Behavior normal.        Thought Content: Thought content normal.        Judgment: Judgment normal.     Lab Results  Component Value Date   WBC 5.5 11/09/2018   HGB 14.8 11/09/2018   HCT 45.7 11/09/2018   PLT 356.0 11/09/2018   GLUCOSE 93 02/10/2019   CHOL 213 (H)  05/21/2017   TRIG 80.0 05/21/2017   HDL 39.80 05/21/2017   LDLDIRECT 196.0 12/08/2007   LDLCALC 157 (H) 05/21/2017   ALT 21 11/09/2018   AST 21 11/09/2018   NA 139 02/10/2019   K 4.0 02/10/2019   CL 103 02/10/2019   CREATININE 1.09 02/10/2019   BUN 15 02/10/2019   CO2 27 02/10/2019   TSH 1.50 11/09/2018   PSA 0.23 10/13/2017   INR 0.9 10/10/2010   HGBA1C 6.1 02/10/2019    US Abdomen Complete  Result Date: 01/07/2018 CLINICAL DATA:  Acute onset of RIGHT UPPER and LOWER QUADRANT abdominal pain. Current history of Crohn's disease. Personal history of urinary tract calculi. EXAM: ABDOMEN ULTRASOUND COMPLETE COMPARISON:  MRI abdomen and pelvis 12/18/2017. CT abdomen and pelvis 10/13/2017, 07/04/2015. FINDINGS: Gallbladder: No shadowing gallstones or echogenic sludge. No gallbladder wall thickening or pericholecystic fluid. Negative sonographic Murphy sign according to the ultrasound technologist. Common bile duct: Diameter: Approximately 5 mm. Liver: No focal lesion identified. Within normal limits in parenchymal echogenicity. Portal vein is patent on color Doppler imaging with normal direction of blood flow towards the liver. IVC: Patent. Pancreas: While difficult to visualize in its entirety, visualized portions normal in appearance. The distal body and tail are obscured by overlying bowel gas. The pancreas had a normal appearance on the CT 3 months ago. Spleen: Normal size and echotexture without focal parenchymal abnormality. Right Kidney: Length: Approximately 12.2 cm. No hydronephrosis. Well-preserved cortex. Normal parenchymal echotexture. Shadowing 6 mm calculus in a mid calyx as noted on the recent CT. No focal parenchymal abnormality. Left Kidney: Length: Approximately 12.2 cm. No hydronephrosis. Well-preserved cortex. No shadowing calculi. Normal parenchymal echotexture. No focal parenchymal abnormality. Abdominal aorta: Normal in caliber throughout its visualized course in the abdomen  without evidence of significant atherosclerosis. Maximum diameter 2.0 cm. Other findings: None. IMPRESSION: 1. Non-obstructing 6 mm calculus in a mid calyx of the RIGHT kidney, unchanged from the CT 10/13/2017. 2. Normal examination otherwise with a caveat that the pancreatic tail is obscured by overlying bowel gas and was therefore not evaluated (though the pancreas had a normal appearance on the prior CT). Electronically Signed   By: Evangeline Dakin M.D.   On: 01/07/2018 17:17    Assessment & Plan:   There are no diagnoses linked to this encounter.   No orders of the  defined types were placed in this encounter.    Follow-up: No follow-ups on file.  Walker Kehr, MD

## 2019-11-22 NOTE — Assessment & Plan Note (Signed)
A1c

## 2019-11-24 ENCOUNTER — Telehealth: Payer: Self-pay

## 2019-11-24 NOTE — Telephone Encounter (Signed)
PA FOR STENDRA 100 MG HAS BEEN INITIATED  KEY: [id number]

## 2019-12-28 ENCOUNTER — Other Ambulatory Visit: Payer: Self-pay | Admitting: Internal Medicine

## 2020-01-31 ENCOUNTER — Encounter: Payer: Self-pay | Admitting: Internal Medicine

## 2020-01-31 ENCOUNTER — Ambulatory Visit (INDEPENDENT_AMBULATORY_CARE_PROVIDER_SITE_OTHER): Payer: Managed Care, Other (non HMO) | Admitting: Internal Medicine

## 2020-01-31 ENCOUNTER — Other Ambulatory Visit: Payer: Self-pay

## 2020-01-31 ENCOUNTER — Ambulatory Visit: Payer: Managed Care, Other (non HMO) | Admitting: Internal Medicine

## 2020-01-31 DIAGNOSIS — T25322A Burn of third degree of left foot, initial encounter: Secondary | ICD-10-CM

## 2020-01-31 DIAGNOSIS — K5080 Crohn's disease of both small and large intestine without complications: Secondary | ICD-10-CM

## 2020-01-31 DIAGNOSIS — R7309 Other abnormal glucose: Secondary | ICD-10-CM | POA: Diagnosis not present

## 2020-01-31 DIAGNOSIS — T542X1A Toxic effect of corrosive acids and acid-like substances, accidental (unintentional), initial encounter: Secondary | ICD-10-CM | POA: Diagnosis not present

## 2020-01-31 DIAGNOSIS — T25022A Burn of unspecified degree of left foot, initial encounter: Secondary | ICD-10-CM | POA: Insufficient documentation

## 2020-01-31 LAB — HEMOGLOBIN A1C: Hgb A1c MFr Bld: 6.1 % (ref 4.6–6.5)

## 2020-01-31 LAB — COMPREHENSIVE METABOLIC PANEL
ALT: 17 U/L (ref 0–53)
AST: 16 U/L (ref 0–37)
Albumin: 4.1 g/dL (ref 3.5–5.2)
Alkaline Phosphatase: 89 U/L (ref 39–117)
BUN: 11 mg/dL (ref 6–23)
CO2: 26 mEq/L (ref 19–32)
Calcium: 8.9 mg/dL (ref 8.4–10.5)
Chloride: 104 mEq/L (ref 96–112)
Creatinine, Ser: 0.99 mg/dL (ref 0.40–1.50)
GFR: 81.14 mL/min (ref >60.00)
Glucose, Bld: 99 mg/dL (ref 70–99)
Potassium: 3.8 mEq/L (ref 3.5–5.1)
Sodium: 137 mEq/L (ref 135–145)
Total Bilirubin: 0.7 mg/dL (ref 0.2–1.2)
Total Protein: 7.7 g/dL (ref 6.0–8.3)

## 2020-01-31 LAB — CBC WITH DIFFERENTIAL/PLATELET
Basophils Absolute: 0.1 10*3/uL (ref 0.0–0.1)
Basophils Relative: 1 % (ref 0.0–3.0)
Eosinophils Absolute: 0.2 10*3/uL (ref 0.0–0.7)
Eosinophils Relative: 2.8 % (ref 0.0–5.0)
HCT: 43.7 % (ref 39.0–52.0)
Hemoglobin: 14.6 g/dL (ref 13.0–17.0)
Lymphocytes Relative: 29.1 % (ref 12.0–46.0)
Lymphs Abs: 2.2 10*3/uL (ref 0.7–4.0)
MCHC: 33.4 g/dL (ref 30.0–36.0)
MCV: 82.2 fl (ref 78.0–100.0)
Monocytes Absolute: 0.6 10*3/uL (ref 0.1–1.0)
Monocytes Relative: 8 % (ref 3.0–12.0)
Neutro Abs: 4.5 10*3/uL (ref 1.4–7.7)
Neutrophils Relative %: 59.1 % (ref 43.0–77.0)
Platelets: 375 10*3/uL (ref 150.0–400.0)
RBC: 5.31 Mil/uL (ref 4.22–5.81)
RDW: 13.8 % (ref 11.5–15.5)
WBC: 7.6 10*3/uL (ref 4.0–10.5)

## 2020-01-31 MED ORDER — ACETAMINOPHEN-CODEINE 300-30 MG PO TABS: 1.0000 | ORAL_TABLET | Freq: Four times a day (QID) | ORAL | 0 refills | Status: DC | PRN

## 2020-01-31 MED ORDER — AMOXICILLIN-POT CLAVULANATE 875-125 MG PO TABS: 1.0000 | ORAL_TABLET | Freq: Two times a day (BID) | ORAL | 1 refills | Status: DC

## 2020-01-31 NOTE — Assessment & Plan Note (Signed)
Wound was debrided Cont Silvadine Augmentin po T#3

## 2020-01-31 NOTE — Assessment & Plan Note (Signed)
On Entyvio Needs labs

## 2020-01-31 NOTE — Assessment & Plan Note (Signed)
A1c

## 2020-01-31 NOTE — Progress Notes (Signed)
Subjective:  Patient ID: Casey Kelly, male    DOB: [birth year]  Age: 44 y.o. MRN: [id number]  CC: No chief complaint on file.   HPI LYNDALL WINDT presents for a burn. He has a burn on his foot that he went to urgent care for. It in now red and painful. He bought a gallon of sulfuric acid to burn a weed shrub and accidentally spilled it on his foot. He is using Silvadine... C/o pain, new redness....++++++++++++++++   Outpatient Medications Prior to Visit  Medication Sig Dispense Refill  . amLODipine (NORVASC) 5 MG tablet Take 1 tablet (5 mg total) by mouth daily. 90 tablet 3  . Avanafil (STENDRA) 100 MG TABS Take 100-200 mg by mouth daily as needed. 12 tablet 5  . Cholecalciferol (VITAMIN D3) 1.25 MG (50000 UT) CAPS Take 1 capsule by mouth once a week. 8 capsule 0  . Cholecalciferol (VITAMIN D3) 50 MCG (2000 UT) capsule Take 1 capsule (2,000 Units total) by mouth daily. 100 capsule 3  . Diclofenac Sodium (PENNSAID) 2 % SOLN Place 2 g onto the skin 2 (two) times daily. 112 g 3  . diphenoxylate-atropine (LOMOTIL) 2.5-0.025 MG tablet TAKE 1 TABLET BY MOUTH 4 TIMES A DAY AS NEEDED FOR DIARRHEA OR LOOSE STOOLS. 120 tablet 0  . metoprolol succinate (TOPROL-XL) 25 MG 24 hr tablet Take 1 tablet (25 mg total) by mouth daily. 90 tablet 3  . Pancrelipase, Lip-Prot-Amyl, (ZENPEP) 40000-126000 units CPEP Take 1-2 capsules by mouth 3 (three) times daily with meals. 270 capsule 3  . Vedolizumab (ENTYVIO IV) Inject into the vein.    [name] zolpidem (AMBIEN) 10 MG tablet Take 0.5-1 tablets (5-10 mg total) by mouth at bedtime as needed for sleep. 30 tablet 3   No facility-administered medications prior to visit.    ROS: Review of Systems  Objective:  There were no vitals taken for this visit.  BP Readings from Last 3 Encounters:  11/22/19 (!) 128/86  02/11/19 (!) 152/90  02/10/19 (!) 144/94    Wt Readings from Last 3 Encounters:  11/22/19 (!) 295 lb (133.8 kg)  02/11/19 295 lb (133.8 kg)   02/10/19 293 lb (132.9 kg)    Physical Exam   Procedure note:  Wound debridement and drainage of blisters  Indication : a localized collection of fluid under necrotic skin and debris in the wound   Risks including unsuccessful procedure , possible need for a repeat procedure, scar formation, and others as well as benefits were explained to the patient and her dtr in detail. Oral consent was obtained/signed.    The patient was placed in a sitting position. The area around wound was prepped with povidone-iodine and draped in a sterile fashion. Dead skin was removed. About 1-2 cc of liquid was expressed.  The wound was dressed with Bactroban ointment and Telfa pad. Coban wrap.  Tolerated well.  Complications: None.  Wound instructions provided.   Wound instructions : change dressing once a day or twice a day is needed. Change dressing after  shower in the morning.  Pat dry the wound with gauze. Pull out one inch of packing everyday and cut it off. Re-dress wound with antibiotic ointment and Telfa pad    Please contact us if you notice increased swelling or pain in the area.  Lab Results  Component Value Date   WBC 5.5 11/09/2018   HGB 14.8 11/09/2018   HCT 45.7 11/09/2018   PLT 356.0 11/09/2018   GLUCOSE  93 02/10/2019   CHOL 213 (H) 05/21/2017   TRIG 80.0 05/21/2017   HDL 39.80 05/21/2017   LDLDIRECT 196.0 12/08/2007   LDLCALC 157 (H) 05/21/2017   ALT 21 11/09/2018   AST 21 11/09/2018   NA 139 02/10/2019   K 4.0 02/10/2019   CL 103 02/10/2019   CREATININE 1.09 02/10/2019   BUN 15 02/10/2019   CO2 27 02/10/2019   TSH 1.50 11/09/2018   PSA 0.23 10/13/2017   INR 0.9 10/10/2010   HGBA1C 6.1 02/10/2019    US Abdomen Complete  Result Date: 01/07/2018 CLINICAL DATA:  Acute onset of RIGHT UPPER and LOWER QUADRANT abdominal pain. Current history of Crohn's disease. Personal history of urinary tract calculi. EXAM: ABDOMEN ULTRASOUND COMPLETE COMPARISON:  MRI abdomen and  pelvis 12/18/2017. CT abdomen and pelvis 10/13/2017, 07/04/2015. FINDINGS: Gallbladder: No shadowing gallstones or echogenic sludge. No gallbladder wall thickening or pericholecystic fluid. Negative sonographic Murphy sign according to the ultrasound technologist. Common bile duct: Diameter: Approximately 5 mm. Liver: No focal lesion identified. Within normal limits in parenchymal echogenicity. Portal vein is patent on color Doppler imaging with normal direction of blood flow towards the liver. IVC: Patent. Pancreas: While difficult to visualize in its entirety, visualized portions normal in appearance. The distal body and tail are obscured by overlying bowel gas. The pancreas had a normal appearance on the CT 3 months ago. Spleen: Normal size and echotexture without focal parenchymal abnormality. Right Kidney: Length: Approximately 12.2 cm. No hydronephrosis. Well-preserved cortex. Normal parenchymal echotexture. Shadowing 6 mm calculus in a mid calyx as noted on the recent CT. No focal parenchymal abnormality. Left Kidney: Length: Approximately 12.2 cm. No hydronephrosis. Well-preserved cortex. No shadowing calculi. Normal parenchymal echotexture. No focal parenchymal abnormality. Abdominal aorta: Normal in caliber throughout its visualized course in the abdomen without evidence of significant atherosclerosis. Maximum diameter 2.0 cm. Other findings: None. IMPRESSION: 1. Non-obstructing 6 mm calculus in a mid calyx of the RIGHT kidney, unchanged from the CT 10/13/2017. 2. Normal examination otherwise with a caveat that the pancreatic tail is obscured by overlying bowel gas and was therefore not evaluated (though the pancreas had a normal appearance on the prior CT). Electronically Signed   By: Evangeline Dakin M.D.   On: 01/07/2018 17:17    Assessment & Plan:   There are no diagnoses linked to this encounter.   No orders of the defined types were placed in this encounter.    Follow-up: No follow-ups on  file.  Walker Kehr, MD

## 2020-01-31 NOTE — Patient Instructions (Addendum)
Chemical Burn, Adult A chemical burn is an injury to the skin. The structures below the skin can also be injured, such as the lungs and other internal organs. This type of burn is caused by coming into contact with a chemical that can damage and kill tissue (caustic chemical). Common caustic chemicals are found in fertilizers, household cleaners, and drain cleaners. A chemical burn can be more serious than other burns. Some chemicals continue to cause damage even after they have been removed from the skin. What are the causes? This condition is caused by swallowing, breathing in, touching, or being touched by a caustic chemical. What increases the risk? You are more likely to get a chemical burn if you work in a place where chemicals are made, stored, or used. These include working in [employer], educational, [place], farming, and mining. What are the signs or symptoms? Symptoms of this condition depend on the type of chemical that caused the burn and the way the chemical came in contact with the body. Symptoms may continue to get worse even after the chemical has been removed.  Common symptoms include: ? Color changes of the skin. Your skin may lose color (blanch), turn red, or turn darker. ? Blistered skin. ? Rash. ? Dry, flaky skin. ? A type of acne (chloracne) that results from exposure to certain chemicals. ? Burning or aching pain. ? Itching.  If the chemical is breathed in, or inhaled, symptoms include eye or nose irritation, sore throat, or coughing.  If the chemical is swallowed (ingested) or absorbed into the body through a wound, it can damage: ? Organs, including the liver, kidneys, and bladder. ? The immune system, the nervous system, or the nose, throat, windpipe, and lungs (respiratory system). Later symptoms may include scarring, shrinking of the skin, and permanent change in skin color. How is this diagnosed? This condition is diagnosed with a medical history and physical exam. Your  health care provider will check how deep the burn is and how much of your skin surface it covers. You may be diagnosed with a:  First-degree burn, if the burn only affects the outer layer of skin (epidermis).  Second-degree burn, if the burn extends into the second layer of skin (dermis).  Third-degree burn, if the burn extends through the dermis and into deeper tissue (hypodermis). Your blood pressure, heart rate, and urine output may also be measured. If the injury is severe, you may also have:  Blood tests.  A test to check the heart's electrical activity (electrocardiogram, or ECG).  A chest X-ray. How is this treated? This condition may be treated by removing the caustic chemical. The skin will be washed or brushed to remove the chemical. Clothes will be removed if they have the chemical on them. After the chemical is removed, you may receive:  Oxygen to help you breathe.  Antibiotic medicine to fight infection.  Pain medicine.  Fluids through an IV.  Bandages (dressings).  A procedure to remove dead tissue (debridement).  A tetanus shot. Long-term burn care may include:  Breathing support. You may be given oxygen using a machine (ventilator).  Frequent wound dressing changes.  Antibiotics.  Surgery, including debridement, skin graft, or repairing of damaged tissue or structures.  Physical therapy. Follow these instructions at home: Medicines  Take and apply over-the-counter and prescription medicines only as told by your health care provider.  If you were prescribed an antibiotic medicine, take or apply it as told by your health care provider. Do not stop  using the antibiotic even if you start to feel better. Burn care  Follow instructions from your health care provider about how to take care of your burn, including: ? How to clean your burn. ? When and how you should remove or change your dressing.  Check your burn every day for signs of infection. Check  for: ? More redness, swelling, or pain. ? Fluid or blood. ? Warmth. ? Pus or a bad smell.  Keep the dressing dry until your health care provider says it can be removed.  Do not take baths, swim, use a hot tub, or do anything that would put your burn underwater until your health care provider approves. Ask your health care provider if you may take showers. You may only be allowed to take sponge baths.  Do not put ice on your burn. This can cause more damage. Activity  Rest as told by your health care provider. Do not exercise until your health care provider approves.  Do range-of-motion movements, if told by your health care provider. General instructions  Raise (elevate) the injured area above the level of your heart while you are sitting or lying down.  Do not scratch or pick at the burn.  Do not break any blisters you may have. Do not peel any skin.  Protect your burn from the sun.  Drink enough fluid to keep your urine pale yellow.  Do not put butter, oil, or other home remedies on your burn. This can lead to an infection.  Do not use any products that contain nicotine or tobacco, such as cigarettes, e-cigarettes, and chewing tobacco. These can delay healing. If you need help quitting, ask your health care provider.  Keep all follow-up visits as told by your health care provider. This is important. How is this prevented?  Avoid exposure to caustic chemicals.  Wear protective gloves and equipment when you handle caustic chemicals.  Make sure all caustic chemicals are labeled.  Talk to your employer about the caustic chemicals that they use. Ask whether those can be replaced with chemicals that are less harmful.  Make sure there is proper airflow (ventilation) in any area with caustic chemicals.  Keep your skin clean and moisturized. Dry skin is more likely to be damaged by chemicals. Contact a health care provider if:  You received a tetanus shot and you have any of  the following symptoms at the injection site: ? Swelling. ? Severe pain. ? Redness. ? Bleeding.  Your symptoms do not improve with treatment.  Your pain is not controlled with medicine.  You have more redness, swelling, or pain around your burn.  Your burn feels warm to the touch. Get help right away if you:  Develop any signs of infection such as: ? Red streaks near the burn. ? Fluid, blood, or pus coming from the burn. ? A bad smell coming from your burn.  Develop severe swelling.  Develop severe pain.  Have a fever.  Have numbness or tingling in the burned area or farther down your legs or arms.  Have trouble breathing, or you develop coughing or noisy breathing (wheezing).  Have chest pain. Summary  A chemical burn is an injury to the skin that is caused by a caustic chemical. Some chemicals continue to cause damage even after they have been removed from the skin.  This condition is more likely to develop in people who are exposed to chemicals at work.  Avoid exposure to caustic chemicals that can cause burns.  Wear protective gloves and equipment when you handle dangerous chemicals. This information is not intended to replace advice given to you by your health care provider. Make sure you discuss any questions you have with your health care provider. Document Revised: 09/29/2018 Document Reviewed: 09/29/2018 Elsevier Patient Education  [employer]. .[email or web address]

## 2020-02-16 ENCOUNTER — Telehealth: Payer: Self-pay | Admitting: Internal Medicine

## 2020-02-16 NOTE — Telephone Encounter (Signed)
Patient states that the burn he was seen for on 01/31/20 doesn't seem that its getting any better and he is using the cream he was given but was wondering if there was something for cooling because it is still burning and doesn't know if he needs to get referred to a burn specialist. Patients next appointment is 03/06/20

## 2020-02-17 NOTE — Telephone Encounter (Signed)
Pls work in on Fri at [employer]

## 2020-02-29 ENCOUNTER — Ambulatory Visit: Payer: Managed Care, Other (non HMO) | Admitting: Internal Medicine

## 2020-02-29 DIAGNOSIS — Z0289 Encounter for other administrative examinations: Secondary | ICD-10-CM

## 2020-03-06 ENCOUNTER — Ambulatory Visit: Payer: Managed Care, Other (non HMO) | Admitting: Internal Medicine

## 2020-03-27 ENCOUNTER — Other Ambulatory Visit: Payer: Self-pay

## 2020-03-27 ENCOUNTER — Encounter: Payer: Self-pay | Admitting: Internal Medicine

## 2020-03-27 ENCOUNTER — Ambulatory Visit: Payer: Managed Care, Other (non HMO) | Admitting: Internal Medicine

## 2020-03-27 DIAGNOSIS — F419 Anxiety disorder, unspecified: Secondary | ICD-10-CM | POA: Diagnosis not present

## 2020-03-27 DIAGNOSIS — T25322S Burn of third degree of left foot, sequela: Secondary | ICD-10-CM | POA: Diagnosis not present

## 2020-03-27 DIAGNOSIS — F4321 Adjustment disorder with depressed mood: Secondary | ICD-10-CM | POA: Insufficient documentation

## 2020-03-27 DIAGNOSIS — R197 Diarrhea, unspecified: Secondary | ICD-10-CM | POA: Diagnosis not present

## 2020-03-27 MED ORDER — ZOLPIDEM TARTRATE 10 MG PO TABS: 5.0000 mg | ORAL_TABLET | Freq: Every evening | ORAL | 3 refills | Status: DC | PRN

## 2020-03-27 MED ORDER — VENLAFAXINE HCL ER 75 MG PO CP24: 75.0000 mg | ORAL_CAPSULE | Freq: Every day | ORAL | 5 refills | Status: DC

## 2020-03-27 MED ORDER — DIPHENOXYLATE-ATROPINE 2.5-0.025 MG PO TABS
ORAL_TABLET | ORAL | 0 refills | Status: DC
Start: 2020-03-27 — End: 2020-05-15

## 2020-03-27 NOTE — Assessment & Plan Note (Addendum)
F/u w/Dr Bloomfeld at [facility] is pending soon - discuss incontinence (Dr Ardis Hughs - pt stopped seeing) Lomotil - rare use

## 2020-03-27 NOTE — Assessment & Plan Note (Signed)
Worse due to diarrhea

## 2020-03-27 NOTE — Assessment & Plan Note (Signed)
Healed
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
## 2020-03-27 NOTE — Assessment & Plan Note (Signed)
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

## 2020-05-01 ENCOUNTER — Other Ambulatory Visit: Payer: Managed Care, Other (non HMO)

## 2020-05-08 ENCOUNTER — Ambulatory Visit: Payer: Managed Care, Other (non HMO) | Admitting: Internal Medicine

## 2020-05-13 ENCOUNTER — Other Ambulatory Visit: Payer: Self-pay | Admitting: Internal Medicine

## 2020-05-15 ENCOUNTER — Telehealth: Payer: Self-pay | Admitting: Internal Medicine

## 2020-05-15 ENCOUNTER — Encounter: Payer: Self-pay | Admitting: Internal Medicine

## 2020-05-15 ENCOUNTER — Telehealth (INDEPENDENT_AMBULATORY_CARE_PROVIDER_SITE_OTHER): Payer: Managed Care, Other (non HMO) | Admitting: Internal Medicine

## 2020-05-15 DIAGNOSIS — K5080 Crohn's disease of both small and large intestine without complications: Secondary | ICD-10-CM

## 2020-05-15 DIAGNOSIS — I10 Essential (primary) hypertension: Secondary | ICD-10-CM | POA: Diagnosis not present

## 2020-05-15 DIAGNOSIS — F4321 Adjustment disorder with depressed mood: Secondary | ICD-10-CM

## 2020-05-15 NOTE — Telephone Encounter (Signed)
[facility] Report/Call: 05/13/2020 Caller has 2 BP Rxs which he just took the last pills. His pharmacy has a refill for one but not the other. He asks why the other is not available. Pharmacy closes at 6. Blood pressure runs high lately, recently 179 (top). ---Caller states he is needing a refill on metoprolol 25 mg daily. Has been SOB, current BP is 181/113  Advised go to ED now.  I do not see patient went to ED. Patient has a VV today 05/15/20.

## 2020-05-15 NOTE — Assessment & Plan Note (Signed)
May be a little better. Follow-up with Dr. Koleen Distance. Continue with Entyvio infusions, Lomotil as needed

## 2020-05-15 NOTE — Progress Notes (Signed)
Virtual Visit via Telephone Note  I connected with Casey Kelly on 05/15/20 at 10:20 AM EST by telephone and verified that I am speaking with the correct person using two identifiers.  Location: Patient: Home Provider: Home   I discussed the limitations, risks, security and privacy concerns of performing an evaluation and management service by telephone and the availability of in person appointments. I also discussed with the patient that there may be a patient responsible charge related to this service. The patient expressed understanding and agreed to proceed.   History of Present Illness:  We were going to follow-up on stress/depression-seems to be better on Effexor, Crohn's disease with stool incontinence, diarrhea. Blood pressure remains elevated in 140 range. She is status post the second infusion of Entyvio-may be doing a little better. Observations/Objective: Casey Kelly sounds normal on the phone  Assessment and Plan:  See plan Follow Up Instructions:    I discussed the assessment and treatment plan with the patient. The patient was provided an opportunity to ask questions and all were answered. The patient agreed with the plan and demonstrated an understanding of the instructions.   The patient was advised to call back or seek an in-person evaluation if the symptoms worsen or if the condition fails to improve as anticipated.  I provided 14 minutes of non-face-to-face time during this encounter.   Walker Kehr, MD

## 2020-05-15 NOTE — Assessment & Plan Note (Signed)
Continue with amlodipine and Toprol

## 2020-05-15 NOTE — Telephone Encounter (Signed)
Metoprolol was renewed.  Thanks

## 2020-05-15 NOTE — Assessment & Plan Note (Signed)
A little better on the Effexor. We will continue at current dose 75 mg daily

## 2020-06-27 ENCOUNTER — Other Ambulatory Visit: Payer: Self-pay

## 2020-06-28 ENCOUNTER — Encounter: Payer: Self-pay | Admitting: Internal Medicine

## 2020-06-28 ENCOUNTER — Ambulatory Visit (INDEPENDENT_AMBULATORY_CARE_PROVIDER_SITE_OTHER): Payer: Managed Care, Other (non HMO) | Admitting: Internal Medicine

## 2020-06-28 VITALS — BP 140/85 | HR 76 | Temp 98.9°F | Ht 75.0 in | Wt 298.4 lb

## 2020-06-28 DIAGNOSIS — E669 Obesity, unspecified: Secondary | ICD-10-CM

## 2020-06-28 DIAGNOSIS — R7309 Other abnormal glucose: Secondary | ICD-10-CM | POA: Diagnosis not present

## 2020-06-28 DIAGNOSIS — Z0000 Encounter for general adult medical examination without abnormal findings: Secondary | ICD-10-CM

## 2020-06-28 DIAGNOSIS — E559 Vitamin D deficiency, unspecified: Secondary | ICD-10-CM | POA: Diagnosis not present

## 2020-06-28 DIAGNOSIS — I10 Essential (primary) hypertension: Secondary | ICD-10-CM | POA: Diagnosis not present

## 2020-06-28 DIAGNOSIS — A630 Anogenital (venereal) warts: Secondary | ICD-10-CM

## 2020-06-28 DIAGNOSIS — K5080 Crohn's disease of both small and large intestine without complications: Secondary | ICD-10-CM

## 2020-06-28 DIAGNOSIS — F419 Anxiety disorder, unspecified: Secondary | ICD-10-CM

## 2020-06-28 DIAGNOSIS — F4321 Adjustment disorder with depressed mood: Secondary | ICD-10-CM | POA: Diagnosis not present

## 2020-06-28 DIAGNOSIS — Z125 Encounter for screening for malignant neoplasm of prostate: Secondary | ICD-10-CM | POA: Diagnosis not present

## 2020-06-28 LAB — COMPREHENSIVE METABOLIC PANEL
ALT: 26 U/L (ref 0–53)
AST: 24 U/L (ref 0–37)
Albumin: 3.9 g/dL (ref 3.5–5.2)
Alkaline Phosphatase: 89 U/L (ref 39–117)
BUN: 15 mg/dL (ref 6–23)
CO2: 28 mEq/L (ref 19–32)
Calcium: 9 mg/dL (ref 8.4–10.5)
Chloride: 105 mEq/L (ref 96–112)
Creatinine, Ser: 1.07 mg/dL (ref 0.40–1.50)
GFR: 82.88 mL/min (ref >60.00)
Glucose, Bld: 95 mg/dL (ref 70–99)
Potassium: 4 mEq/L (ref 3.5–5.1)
Sodium: 139 mEq/L (ref 135–145)
Total Bilirubin: 0.5 mg/dL (ref 0.2–1.2)
Total Protein: 7.2 g/dL (ref 6.0–8.3)

## 2020-06-28 LAB — CBC WITH DIFFERENTIAL/PLATELET
Basophils Absolute: 0.1 10*3/uL (ref 0.0–0.1)
Basophils Relative: 1.1 % (ref 0.0–3.0)
Eosinophils Absolute: 0.2 10*3/uL (ref 0.0–0.7)
Eosinophils Relative: 3.9 % (ref 0.0–5.0)
HCT: 43.8 % (ref 39.0–52.0)
Hemoglobin: 14.7 g/dL (ref 13.0–17.0)
Lymphocytes Relative: 34.7 % (ref 12.0–46.0)
Lymphs Abs: 2 10*3/uL (ref 0.7–4.0)
MCHC: 33.5 g/dL (ref 30.0–36.0)
MCV: 82.8 fl (ref 78.0–100.0)
Monocytes Absolute: 0.5 10*3/uL (ref 0.1–1.0)
Monocytes Relative: 9.4 % (ref 3.0–12.0)
Neutro Abs: 2.9 10*3/uL (ref 1.4–7.7)
Neutrophils Relative %: 50.9 % (ref 43.0–77.0)
Platelets: 342 10*3/uL (ref 150.0–400.0)
RBC: 5.3 Mil/uL (ref 4.22–5.81)
RDW: 13.4 % (ref 11.5–15.5)
WBC: 5.7 10*3/uL (ref 4.0–10.5)

## 2020-06-28 LAB — LIPID PANEL
Cholesterol: 191 mg/dL (ref 0–200)
HDL: 39.6 mg/dL (ref >39.00)
LDL Cholesterol: 133 mg/dL — ABNORMAL HIGH (ref 0–99)
NonHDL: 151.57
Total CHOL/HDL Ratio: 5
Triglycerides: 92 mg/dL (ref 0.0–149.0)
VLDL: 18.4 mg/dL (ref 0.0–40.0)

## 2020-06-28 LAB — URINALYSIS
Bilirubin Urine: NEGATIVE
Hgb urine dipstick: NEGATIVE
Ketones, ur: NEGATIVE
Leukocytes,Ua: NEGATIVE
Nitrite: NEGATIVE
Specific Gravity, Urine: >=1.03 — AB (ref 1.000–1.030)
Total Protein, Urine: NEGATIVE
Urine Glucose: NEGATIVE
Urobilinogen, UA: 0.2 (ref 0.0–1.0)
pH: 6 (ref 5.0–8.0)

## 2020-06-28 LAB — HEMOGLOBIN A1C: Hgb A1c MFr Bld: 5.8 % (ref 4.6–6.5)

## 2020-06-28 MED ORDER — IMIQUIMOD 5 % EX CREA: TOPICAL_CREAM | CUTANEOUS | 1 refills | Status: AC

## 2020-06-28 NOTE — Assessment & Plan Note (Signed)
Cont Amlodipine, Toprol

## 2020-06-28 NOTE — Assessment & Plan Note (Signed)
Pt is asking for a wt loss surgery referral. BMI 37 Surgery ref.

## 2020-06-28 NOTE — Assessment & Plan Note (Addendum)
  We discussed age appropriate health related issues, including available/recomended screening tests and vaccinations. Labs were ordered to be later reviewed . All questions were answered. We discussed one or more of the following - seat belt use, use of sunscreen/sun exposure exercise, safe sex, fall risk reduction, second hand smoke exposure, firearm use and storage, seat belt use, a need for adhering to healthy diet and exercise. Labs were ordered.  All questions were answered.  Colon is due 06/2020 pending

## 2020-06-28 NOTE — Assessment & Plan Note (Signed)
A1c

## 2020-06-28 NOTE — Assessment & Plan Note (Signed)
Aldara Rx given

## 2020-06-28 NOTE — Assessment & Plan Note (Signed)
On Entyvio IV

## 2020-06-28 NOTE — Progress Notes (Signed)
Subjective:  Patient ID: Casey Kelly, male    DOB: 03-21-1976  Age: 45 y.o. MRN: [id number]  CC: Annual Exam   HPI Casey Kelly presents for HTN, Crohn's, stress f/u Diarrhea is better on vegetarian plus diet (no fried or fast food) The patient is here for a well exam.  The patient is asking for a wt loss surgery referral.  He is BMI is 37  He has been feeling better overall emotionally.  He is SBP is 140 at home.  He lost a little weight.  He started to exercise regularly at the gym.   Outpatient Medications Prior to Visit  Medication Sig Dispense Refill  . amLODipine (NORVASC) 5 MG tablet Take 1 tablet (5 mg total) by mouth daily. 90 tablet 3  . Avanafil (STENDRA) 100 MG TABS Take 100-200 mg by mouth daily as needed. 12 tablet 5  . Cholecalciferol (VITAMIN D3) 1.25 MG (50000 UT) CAPS Take 1 capsule by mouth once a week. 8 capsule 0  . Cholecalciferol (VITAMIN D3) 50 MCG (2000 UT) capsule Take 1 capsule (2,000 Units total) by mouth daily. 100 capsule 3  . diphenoxylate-atropine (LOMOTIL) 2.5-0.025 MG tablet TAKE 1 TABLET BY MOUTH FOUR TIMES DAILY AS NEEDED FOR DIARRHEA OR LOOSE STOOLS 120 tablet 1  . metoprolol succinate (TOPROL-XL) 25 MG 24 hr tablet TAKE 1 TABLET BY MOUTH EVERY DAY. ANNUAL APPT DUE IN JULY MUST SEE PROVIDER FOR FUTURE REFILLS 90 tablet 3  . Pancrelipase, Lip-Prot-Amyl, (ZENPEP) 40000-126000 units CPEP Take 1-2 capsules by mouth 3 (three) times daily with meals. 270 capsule 3  . Vedolizumab (ENTYVIO IV) Inject into the vein.    [name] venlafaxine XR (EFFEXOR XR) 75 MG 24 hr capsule Take 1 capsule (75 mg total) by mouth daily with breakfast. 30 capsule 5  . zolpidem (AMBIEN) 10 MG tablet Take 0.5-1 tablets (5-10 mg total) by mouth at bedtime as needed for sleep. 30 tablet 3  . Diclofenac Sodium (PENNSAID) 2 % SOLN Place 2 g onto the skin 2 (two) times daily. (Patient not taking: Reported on 06/28/2020) 112 g 3   No facility-administered medications prior to visit.     ROS: Review of Systems  Constitutional: Negative for appetite change, fatigue and unexpected weight change.  HENT: Negative for congestion, nosebleeds, sneezing, sore throat and trouble swallowing.   Eyes: Negative for itching and visual disturbance.  Respiratory: Negative for cough.   Cardiovascular: Negative for chest pain, palpitations and leg swelling.  Gastrointestinal: Positive for diarrhea. Negative for abdominal distention, blood in stool and nausea.  Genitourinary: Negative for frequency and hematuria.  Musculoskeletal: Positive for back pain. Negative for gait problem, joint swelling and neck pain.  Skin: Negative for rash.  Neurological: Negative for dizziness, tremors, speech difficulty and weakness.  Psychiatric/Behavioral: Positive for dysphoric mood. Negative for agitation, sleep disturbance and suicidal ideas. The patient is not nervous/anxious.     Objective:  BP (!) 148/102 (BP Location: Left Arm)   Pulse 76   Temp 98.9 F (37.2 C) (Oral)   Ht 6' 3"  (1.905 m)   Wt 298 lb 6.4 oz (135.4 kg)   SpO2 96%   BMI 37.30 kg/m   BP Readings from Last 3 Encounters:  06/28/20 (!) 148/102  03/27/20 (!) 148/92  01/31/20 (!) 150/98    Wt Readings from Last 3 Encounters:  06/28/20 298 lb 6.4 oz (135.4 kg)  03/27/20 299 lb (135.6 kg)  01/31/20 299 lb (135.6 kg)    Physical Exam Constitutional:  General: He is not in acute distress.    Appearance: He is well-developed. He is obese.     Comments: NAD  HENT:     Mouth/Throat:     Mouth: Oropharynx is clear and moist.  Eyes:     Conjunctiva/sclera: Conjunctivae normal.     Pupils: Pupils are equal, round, and reactive to light.  Neck:     Thyroid: No thyromegaly.     Vascular: No JVD.  Cardiovascular:     Rate and Rhythm: Normal rate and regular rhythm.     Pulses: Intact distal pulses.     Heart sounds: Normal heart sounds. No murmur heard. No friction rub. No gallop.   Pulmonary:     Effort: Pulmonary  effort is normal. No respiratory distress.     Breath sounds: Normal breath sounds. No wheezing or rales.  Chest:     Chest wall: No tenderness.  Abdominal:     General: Bowel sounds are normal. There is no distension.     Palpations: Abdomen is soft. There is no mass.     Tenderness: There is no abdominal tenderness. There is no guarding or rebound.  Musculoskeletal:        General: No tenderness or edema. Normal range of motion.     Cervical back: Normal range of motion.  Lymphadenopathy:     Cervical: No cervical adenopathy.  Skin:    General: Skin is warm and dry.     Findings: No rash.  Neurological:     Mental Status: He is alert and oriented to person, place, and time.     Cranial Nerves: No cranial nerve deficit.     Motor: No abnormal muscle tone.     Coordination: He displays a negative Romberg sign. Coordination normal.     Gait: Gait normal.     Deep Tendon Reflexes: Reflexes are normal and symmetric.  Psychiatric:        Mood and Affect: Mood and affect normal.        Behavior: Behavior normal.        Thought Content: Thought content normal.        Judgment: Judgment normal.   rectal - per GI in 3 wks  I spent 22 minutes in addition to time for CPX wellness examination in preparing to see the patient by review of recent labs, imaging and procedures, obtaining and reviewing separately obtained history, communicating with the patient, ordering medications, tests or procedures, and documenting clinical information in the EHR including the differential diagnosis, treatment, and any further evaluation and other management of gen warts, obesity, Vit D def         Lab Results  Component Value Date   WBC 7.6 01/31/2020   HGB 14.6 01/31/2020   HCT 43.7 01/31/2020   PLT 375.0 01/31/2020   GLUCOSE 99 01/31/2020   CHOL 213 (H) 05/21/2017   TRIG 80.0 05/21/2017   HDL 39.80 05/21/2017   LDLDIRECT 196.0 12/08/2007   LDLCALC 157 (H) 05/21/2017   ALT 17 01/31/2020   AST  16 01/31/2020   NA 137 01/31/2020   K 3.8 01/31/2020   CL 104 01/31/2020   CREATININE 0.99 01/31/2020   BUN 11 01/31/2020   CO2 26 01/31/2020   TSH 1.50 11/09/2018   PSA 0.23 10/13/2017   INR 0.9 10/10/2010   HGBA1C 6.1 01/31/2020    US Abdomen Complete  Result Date: 01/07/2018 CLINICAL DATA:  Acute onset of RIGHT UPPER and LOWER QUADRANT abdominal pain. Current  history of Crohn's disease. Personal history of urinary tract calculi. EXAM: ABDOMEN ULTRASOUND COMPLETE COMPARISON:  MRI abdomen and pelvis 12/18/2017. CT abdomen and pelvis 10/13/2017, 07/04/2015. FINDINGS: Gallbladder: No shadowing gallstones or echogenic sludge. No gallbladder wall thickening or pericholecystic fluid. Negative sonographic Murphy sign according to the ultrasound technologist. Common bile duct: Diameter: Approximately 5 mm. Liver: No focal lesion identified. Within normal limits in parenchymal echogenicity. Portal vein is patent on color Doppler imaging with normal direction of blood flow towards the liver. IVC: Patent. Pancreas: While difficult to visualize in its entirety, visualized portions normal in appearance. The distal body and tail are obscured by overlying bowel gas. The pancreas had a normal appearance on the CT 3 months ago. Spleen: Normal size and echotexture without focal parenchymal abnormality. Right Kidney: Length: Approximately 12.2 cm. No hydronephrosis. Well-preserved cortex. Normal parenchymal echotexture. Shadowing 6 mm calculus in a mid calyx as noted on the recent CT. No focal parenchymal abnormality. Left Kidney: Length: Approximately 12.2 cm. No hydronephrosis. Well-preserved cortex. No shadowing calculi. Normal parenchymal echotexture. No focal parenchymal abnormality. Abdominal aorta: Normal in caliber throughout its visualized course in the abdomen without evidence of significant atherosclerosis. Maximum diameter 2.0 cm. Other findings: None. IMPRESSION: 1. Non-obstructing 6 mm calculus in a mid  calyx of the RIGHT kidney, unchanged from the CT 10/13/2017. 2. Normal examination otherwise with a caveat that the pancreatic tail is obscured by overlying bowel gas and was therefore not evaluated (though the pancreas had a normal appearance on the prior CT). Electronically Signed   By: Evangeline Dakin M.D.   On: 01/07/2018 17:17    Assessment & Plan:   There are no diagnoses linked to this encounter.   No orders of the defined types were placed in this encounter.    Follow-up: No follow-ups on file.  Walker Kehr, MD

## 2020-06-28 NOTE — Assessment & Plan Note (Signed)
Better  

## 2020-06-28 NOTE — Assessment & Plan Note (Signed)
Better Cont Effexor

## 2020-06-29 ENCOUNTER — Encounter: Payer: Self-pay | Admitting: Internal Medicine

## 2020-06-29 LAB — VITAMIN D 25 HYDROXY (VIT D DEFICIENCY, FRACTURES): VITD: 38.01 ng/mL (ref 30.00–100.00)

## 2020-06-29 LAB — TSH: TSH: 1.46 u[IU]/mL (ref 0.35–4.50)

## 2020-06-29 LAB — PSA: PSA: 0.2 ng/mL (ref 0.10–4.00)

## 2020-10-05 ENCOUNTER — Other Ambulatory Visit: Payer: Self-pay | Admitting: Internal Medicine

## 2020-10-24 ENCOUNTER — Other Ambulatory Visit: Payer: Self-pay

## 2020-10-26 MED ORDER — ZOLPIDEM TARTRATE 10 MG PO TABS: 5.0000 mg | ORAL_TABLET | Freq: Every evening | ORAL | 3 refills | Status: DC | PRN

## 2020-11-02 ENCOUNTER — Encounter: Payer: Self-pay | Admitting: Internal Medicine

## 2020-11-02 ENCOUNTER — Other Ambulatory Visit: Payer: Self-pay

## 2020-11-02 ENCOUNTER — Ambulatory Visit: Payer: Managed Care, Other (non HMO) | Admitting: Internal Medicine

## 2020-11-02 DIAGNOSIS — E785 Hyperlipidemia, unspecified: Secondary | ICD-10-CM

## 2020-11-02 DIAGNOSIS — R197 Diarrhea, unspecified: Secondary | ICD-10-CM

## 2020-11-02 DIAGNOSIS — K5080 Crohn's disease of both small and large intestine without complications: Secondary | ICD-10-CM | POA: Diagnosis not present

## 2020-11-02 DIAGNOSIS — I10 Essential (primary) hypertension: Secondary | ICD-10-CM | POA: Diagnosis not present

## 2020-11-02 MED ORDER — COLESEVELAM HCL 625 MG PO TABS
1250.0000 mg | ORAL_TABLET | Freq: Two times a day (BID) | ORAL | 11 refills | Status: DC
Start: 2020-11-02 — End: 2023-02-03

## 2020-11-02 MED ORDER — ZOLPIDEM TARTRATE 10 MG PO TABS: 5.0000 mg | ORAL_TABLET | Freq: Every evening | ORAL | 1 refills | Status: DC | PRN

## 2020-11-02 MED ORDER — VENLAFAXINE HCL ER 150 MG PO CP24: 150.0000 mg | ORAL_CAPSULE | Freq: Every day | ORAL | 1 refills | Status: DC

## 2020-11-02 NOTE — Assessment & Plan Note (Signed)
Continue with Entyvio infusions

## 2020-11-02 NOTE — Assessment & Plan Note (Signed)
Not better Try Welchol for diarrhea

## 2020-11-02 NOTE — Assessment & Plan Note (Signed)
On Lipitor Will get a CT calcium score test

## 2020-11-02 NOTE — Assessment & Plan Note (Addendum)
Pt saw Dr Vilinda Boehringer (Cardiology) Cont w/Metoprolol, Losartan, Spironolactone

## 2020-11-02 NOTE — Progress Notes (Signed)
Subjective:  Patient ID: Casey Kelly, male    DOB: Sep 12, 1975  Age: 45 y.o. MRN: [id number]  CC: Medication Problem (Want to discuss new BP med that was rx by cardiologist)   HPI Casey Kelly presents for diarrhea, occasional stool incontinence.F/u HTN, depression - not better.  Outpatient Medications Prior to Visit  Medication Sig Dispense Refill   amLODipine (NORVASC) 5 MG tablet Take 1 tablet (5 mg total) by mouth daily. 90 tablet 3   Avanafil (STENDRA) 100 MG TABS Take 100-200 mg by mouth daily as needed. 12 tablet 5   Cholecalciferol (VITAMIN D3) 50 MCG (2000 UT) capsule Take 1 capsule (2,000 Units total) by mouth daily. 100 capsule 3   diphenoxylate-atropine (LOMOTIL) 2.5-0.025 MG tablet TAKE 1 TABLET BY MOUTH FOUR TIMES DAILY AS NEEDED FOR DIARRHEA OR LOOSE STOOLS 120 tablet 1   metoprolol succinate (TOPROL-XL) 25 MG 24 hr tablet TAKE 1 TABLET BY MOUTH EVERY DAY. ANNUAL APPT DUE IN JULY MUST SEE PROVIDER FOR FUTURE REFILLS 90 tablet 3   Pancrelipase, Lip-Prot-Amyl, (ZENPEP) 40000-126000 units CPEP Take 1-2 capsules by mouth 3 (three) times daily with meals. 270 capsule 3   Vedolizumab (ENTYVIO IV) Inject into the vein.     venlafaxine XR (EFFEXOR-XR) 75 MG 24 hr capsule TAKE 1 CAPSULE(75 MG) BY MOUTH DAILY WITH BREAKFAST 90 capsule 2   zolpidem (AMBIEN) 10 MG tablet Take 0.5-1 tablets (5-10 mg total) by mouth at bedtime as needed for sleep. 30 tablet 3   Cholecalciferol (VITAMIN D3) 1.25 MG (50000 UT) CAPS Take 1 capsule by mouth once a week. 8 capsule 0   No facility-administered medications prior to visit.    ROS: Review of Systems  Constitutional:  Positive for fatigue and unexpected weight change. Negative for appetite change.  HENT:  Negative for congestion, nosebleeds, sneezing, sore throat and trouble swallowing.   Eyes:  Negative for itching and visual disturbance.  Respiratory:  Negative for cough.   Cardiovascular:  Negative for chest pain, palpitations and leg  swelling.  Gastrointestinal:  Positive for abdominal distention, abdominal pain and diarrhea. Negative for blood in stool and nausea.  Genitourinary:  Negative for frequency and hematuria.  Musculoskeletal:  Negative for back pain, gait problem, joint swelling and neck pain.  Skin:  Negative for rash.  Neurological:  Negative for dizziness, tremors, speech difficulty and weakness.  Psychiatric/Behavioral:  Positive for dysphoric mood and sleep disturbance. Negative for agitation and suicidal ideas. The patient is nervous/anxious.    Objective:  BP (!) 142/90 (BP Location: Left Arm)   Pulse 80   Temp 98.1 F (36.7 C) (Oral)   Ht 6' 3"  (1.905 m)   Wt (!) 301 lb 3.2 oz (136.6 kg)   SpO2 97%   BMI 37.65 kg/m   BP Readings from Last 3 Encounters:  11/02/20 (!) 142/90  06/28/20 140/85  03/27/20 (!) 148/92    Wt Readings from Last 3 Encounters:  11/02/20 (!) 301 lb 3.2 oz (136.6 kg)  06/28/20 298 lb 6.4 oz (135.4 kg)  03/27/20 299 lb (135.6 kg)    Physical Exam Constitutional:      General: He is not in acute distress.    Appearance: He is well-developed. He is obese.     Comments: NAD  Eyes:     Conjunctiva/sclera: Conjunctivae normal.     Pupils: Pupils are equal, round, and reactive to light.  Neck:     Thyroid: No thyromegaly.     Vascular: No JVD.  Cardiovascular:     Rate and Rhythm: Normal rate and regular rhythm.     Heart sounds: Normal heart sounds. No murmur heard.   No friction rub. No gallop.  Pulmonary:     Effort: Pulmonary effort is normal. No respiratory distress.     Breath sounds: Normal breath sounds. No wheezing or rales.  Chest:     Chest wall: No tenderness.  Abdominal:     General: Bowel sounds are normal. There is no distension.     Palpations: Abdomen is soft. There is no mass.     Tenderness: There is no abdominal tenderness. There is no guarding or rebound.  Musculoskeletal:        General: No tenderness. Normal range of motion.      Cervical back: Normal range of motion.  Lymphadenopathy:     Cervical: No cervical adenopathy.  Skin:    General: Skin is warm and dry.     Findings: No rash.  Neurological:     Mental Status: He is alert and oriented to person, place, and time.     Cranial Nerves: No cranial nerve deficit.     Motor: No abnormal muscle tone.     Coordination: Coordination normal.     Gait: Gait normal.     Deep Tendon Reflexes: Reflexes are normal and symmetric.  Psychiatric:        Behavior: Behavior normal.        Thought Content: Thought content normal.        Judgment: Judgment normal.    Lab Results  Component Value Date   WBC 5.7 06/28/2020   HGB 14.7 06/28/2020   HCT 43.8 06/28/2020   PLT 342.0 06/28/2020   GLUCOSE 95 06/28/2020   CHOL 191 06/28/2020   TRIG 92.0 06/28/2020   HDL 39.60 06/28/2020   LDLDIRECT 196.0 12/08/2007   LDLCALC 133 (H) 06/28/2020   ALT 26 06/28/2020   AST 24 06/28/2020   NA 139 06/28/2020   K 4.0 06/28/2020   CL 105 06/28/2020   CREATININE 1.07 06/28/2020   BUN 15 06/28/2020   CO2 28 06/28/2020   TSH 1.46 06/28/2020   PSA 0.20 06/28/2020   INR 0.9 10/10/2010   HGBA1C 5.8 06/28/2020    US Abdomen Complete  Result Date: 01/07/2018 CLINICAL DATA:  Acute onset of RIGHT UPPER and LOWER QUADRANT abdominal pain. Current history of Crohn's disease. Personal history of urinary tract calculi. EXAM: ABDOMEN ULTRASOUND COMPLETE COMPARISON:  MRI abdomen and pelvis 12/18/2017. CT abdomen and pelvis 10/13/2017, 07/04/2015. FINDINGS: Gallbladder: No shadowing gallstones or echogenic sludge. No gallbladder wall thickening or pericholecystic fluid. Negative sonographic Murphy sign according to the ultrasound technologist. Common bile duct: Diameter: Approximately 5 mm. Liver: No focal lesion identified. Within normal limits in parenchymal echogenicity. Portal vein is patent on color Doppler imaging with normal direction of blood flow towards the liver. IVC: Patent.  Pancreas: While difficult to visualize in its entirety, visualized portions normal in appearance. The distal body and tail are obscured by overlying bowel gas. The pancreas had a normal appearance on the CT 3 months ago. Spleen: Normal size and echotexture without focal parenchymal abnormality. Right Kidney: Length: Approximately 12.2 cm. No hydronephrosis. Well-preserved cortex. Normal parenchymal echotexture. Shadowing 6 mm calculus in a mid calyx as noted on the recent CT. No focal parenchymal abnormality. Left Kidney: Length: Approximately 12.2 cm. No hydronephrosis. Well-preserved cortex. No shadowing calculi. Normal parenchymal echotexture. No focal parenchymal abnormality. Abdominal aorta: Normal in caliber throughout its  visualized course in the abdomen without evidence of significant atherosclerosis. Maximum diameter 2.0 cm. Other findings: None. IMPRESSION: 1. Non-obstructing 6 mm calculus in a mid calyx of the RIGHT kidney, unchanged from the CT 10/13/2017. 2. Normal examination otherwise with a caveat that the pancreatic tail is obscured by overlying bowel gas and was therefore not evaluated (though the pancreas had a normal appearance on the prior CT). Electronically Signed   By: Evangeline Dakin M.D.   On: 01/07/2018 17:17    Assessment & Plan:    Walker Kehr, MD

## 2020-11-02 NOTE — Patient Instructions (Signed)

## 2020-11-14 ENCOUNTER — Telehealth: Payer: Self-pay | Admitting: Internal Medicine

## 2020-11-14 NOTE — Telephone Encounter (Signed)
[facility] FYI 7.16.22:  caller states he tested positive, is having a slight cough, runny nose, congestion and fatigued. States that he has Crohn's disease and receives Entyvio infusions is wanting advice on what else he can take or what he needs to be doing because he is high risk

## 2020-11-15 NOTE — Telephone Encounter (Signed)
Notified pt w/MD response.../lmb 

## 2020-11-15 NOTE — Telephone Encounter (Signed)
For a mild COVID-19 case - take zinc 50 mg a day for 1 week, vitamin C 1000 mg daily for 1 week, vitamin D2 50,000 units weekly for 2 months (unless  taking vitamin D daily already). Take Allegra or Benadryl.  Maintain good oral hydration and take Tylenol for high fever.  Call and sch VOV if problems. Isolate for 5 days, then wear a mask for 5 days per CDC.  Thx

## 2020-11-29 LAB — HM COLONOSCOPY

## 2020-12-01 ENCOUNTER — Encounter: Payer: Self-pay | Admitting: Internal Medicine

## 2020-12-01 ENCOUNTER — Telehealth: Payer: Self-pay | Admitting: *Deleted

## 2020-12-01 NOTE — Telephone Encounter (Signed)
-----   Message from [place] sent at 11/30/2020 11:32 AM EDT ----- For review

## 2020-12-01 NOTE — Telephone Encounter (Signed)
Abstracted into chart.Marland KitchenJohny Kelly

## 2020-12-03 NOTE — Telephone Encounter (Addendum)
Casey Kelly,  what am I supposed to look at? Thank you

## 2020-12-04 NOTE — Telephone Encounter (Signed)
It was his colonoscopy report.. Was placed in your purple folder to signed.Marland KitchenJohny Chess

## 2020-12-05 ENCOUNTER — Ambulatory Visit (INDEPENDENT_AMBULATORY_CARE_PROVIDER_SITE_OTHER)
Admission: RE | Admit: 2020-12-05 | Discharge: 2020-12-05 | Disposition: A | Discharge status: Home / Self Care | Payer: Self-pay | Source: Ambulatory Visit | Attending: Internal Medicine | Admitting: Internal Medicine

## 2020-12-05 ENCOUNTER — Other Ambulatory Visit: Payer: Self-pay

## 2020-12-05 DIAGNOSIS — E785 Hyperlipidemia, unspecified: Secondary | ICD-10-CM

## 2020-12-07 ENCOUNTER — Other Ambulatory Visit: Payer: Self-pay | Admitting: Internal Medicine

## 2021-01-03 ENCOUNTER — Other Ambulatory Visit: Payer: Self-pay | Admitting: Internal Medicine

## 2021-02-05 ENCOUNTER — Ambulatory Visit: Payer: Managed Care, Other (non HMO) | Admitting: Internal Medicine

## 2021-02-14 ENCOUNTER — Ambulatory Visit: Payer: Managed Care, Other (non HMO) | Admitting: Internal Medicine

## 2021-02-19 ENCOUNTER — Other Ambulatory Visit: Payer: Self-pay | Admitting: Internal Medicine

## 2021-05-08 ENCOUNTER — Other Ambulatory Visit: Payer: Self-pay | Admitting: Urology

## 2021-05-31 NOTE — Progress Notes (Signed)
Left message for patient to call about procedure.

## 2021-06-01 ENCOUNTER — Other Ambulatory Visit: Payer: Self-pay | Admitting: Urology

## 2021-06-13 NOTE — Progress Notes (Signed)
Left voicemail to return call to [phone].

## 2021-06-14 NOTE — Progress Notes (Signed)
Left message for patient to call back [phone].

## 2021-06-15 NOTE — Progress Notes (Signed)
Have already left 3 messages for pt to return call. Called Tamika (spouse) to have pt to return call

## 2021-06-15 NOTE — Progress Notes (Signed)
Patient returned call. Instructions given. Hx and meds reviewed. Arrival time 0600 clear liquids until 0400 wife is the driver. Ok to take AM  B/P meds with sip of water

## 2021-06-15 NOTE — Progress Notes (Signed)
Pt states has an sinus infection. Instructed to take a home covid test and let us know results

## 2021-06-18 ENCOUNTER — Other Ambulatory Visit: Payer: Self-pay

## 2021-06-18 ENCOUNTER — Encounter (HOSPITAL_BASED_OUTPATIENT_CLINIC_OR_DEPARTMENT_OTHER)
Admission: RE | Disposition: A | Discharge status: Home / Self Care | Payer: Self-pay | Source: Home / Self Care | Attending: Urology

## 2021-06-18 ENCOUNTER — Ambulatory Visit (HOSPITAL_COMMUNITY): Payer: Managed Care, Other (non HMO)

## 2021-06-18 ENCOUNTER — Ambulatory Visit (HOSPITAL_BASED_OUTPATIENT_CLINIC_OR_DEPARTMENT_OTHER)
Admission: RE | Admit: 2021-06-18 | Discharge: 2021-06-18 | Disposition: A | Discharge status: Home / Self Care | Payer: Managed Care, Other (non HMO) | Attending: Urology | Admitting: Urology

## 2021-06-18 DIAGNOSIS — Z79899 Other long term (current) drug therapy: Secondary | ICD-10-CM | POA: Insufficient documentation

## 2021-06-18 DIAGNOSIS — I10 Essential (primary) hypertension: Secondary | ICD-10-CM | POA: Diagnosis not present

## 2021-06-18 DIAGNOSIS — N200 Calculus of kidney: Secondary | ICD-10-CM | POA: Diagnosis present

## 2021-06-18 DIAGNOSIS — G4733 Obstructive sleep apnea (adult) (pediatric): Secondary | ICD-10-CM | POA: Insufficient documentation

## 2021-06-18 HISTORY — PX: EXTRACORPOREAL SHOCK WAVE LITHOTRIPSY: SHX1557

## 2021-06-18 SURGERY — LITHOTRIPSY, ESWL
Anesthesia: LOCAL | Laterality: Right

## 2021-06-18 MED ORDER — DIPHENHYDRAMINE HCL 25 MG PO CAPS
ORAL_CAPSULE | ORAL | Status: AC
  Filled 2021-06-18: qty 1

## 2021-06-18 MED ORDER — SENNOSIDES-DOCUSATE SODIUM 8.6-50 MG PO TABS: 1.0000 | ORAL_TABLET | Freq: Two times a day (BID) | ORAL | 0 refills | Status: DC

## 2021-06-18 MED ORDER — OXYCODONE-ACETAMINOPHEN 5-325 MG PO TABS: 1.0000 | ORAL_TABLET | Freq: Four times a day (QID) | ORAL | 0 refills | Status: DC | PRN

## 2021-06-18 MED ORDER — TAMSULOSIN HCL 0.4 MG PO CAPS: 0.4000 mg | ORAL_CAPSULE | Freq: Every day | ORAL | 0 refills | Status: DC

## 2021-06-18 MED ORDER — SODIUM CHLORIDE 0.9 % IV SOLN: INTRAVENOUS | Status: DC

## 2021-06-18 MED ORDER — DIPHENHYDRAMINE HCL 25 MG PO CAPS
25.0000 mg | ORAL_CAPSULE | ORAL | Status: AC
  Administered 2021-06-18: 25 mg via ORAL

## 2021-06-18 MED ORDER — ONDANSETRON 8 MG PO TBDP: 8.0000 mg | ORAL_TABLET | Freq: Three times a day (TID) | ORAL | 0 refills | Status: DC | PRN

## 2021-06-18 MED ORDER — CIPROFLOXACIN HCL 500 MG PO TABS
500.0000 mg | ORAL_TABLET | ORAL | Status: AC
  Administered 2021-06-18: 500 mg via ORAL

## 2021-06-18 MED ORDER — DIAZEPAM 5 MG PO TABS
10.0000 mg | ORAL_TABLET | ORAL | Status: AC
  Administered 2021-06-18: 10 mg via ORAL

## 2021-06-18 MED ORDER — DIAZEPAM 5 MG PO TABS
ORAL_TABLET | ORAL | Status: AC
  Filled 2021-06-18: qty 2

## 2021-06-18 MED ORDER — CIPROFLOXACIN HCL 500 MG PO TABS
ORAL_TABLET | ORAL | Status: AC
  Filled 2021-06-18: qty 1

## 2021-06-18 NOTE — Discharge Instructions (Signed)
1 - You may have urinary urgency (bladder spasms), pass small stone fragments, and bloody urine on / off for up to 2 weeks. This is normal. ° °2 - Call MD or go to ER for fever >102, severe pain / nausea / vomiting not relieved by medications, or acute change in medical status ° °

## 2021-06-18 NOTE — H&P (Signed)
Casey Kelly is an 46 y.o. male.    Chief Complaint: Pre-OP RIGHT Shockwave Lithotripsy  HPI:   1-  RIGHT Renal Pelvis Stone - abtou 1.5cm conglomerate of Rt renal stones, progressive by imaging since at least 2019. Rt mid renal T11 area on KUB after recent stone passage.   Today "Casey Kelly" is seen for RIGHT shockwave lithotrispy. NO interval fevers. Most recent UCX negative.   Past Medical History:  Diagnosis Date   Allergic rhinitis    Bruxism    Crohn's disease ([place])    no current med.   History of chronic sinusitis    History of kidney stones    History of kidney stones    Hypertension    Hypogonadism male    Left ureteral stone    Mild obstructive sleep apnea    per study 10-05-2009--  INTERMITTANT CPAP USE   Nephrolithiasis    bilateral  non-obstructive per urologist note   Plantar fasciitis 2010   bilateral   Sleep apnea    CPAP use   Wears contact lenses     Past Surgical History:  Procedure Laterality Date   ADENOIDECTOMY  as child   COLONOSCOPY  2012   CYSTOSCOPY/URETEROSCOPY/HOLMIUM LASER/STENT PLACEMENT Bilateral 08/15/2015   Procedure: CYSTOSCOPY/ LEFT URETEROSCOPY, POSSIBLE RIGHT URETEROSCOPY [place] LASER/STENT PLACEMENT;  Surgeon: Nickie Retort, MD;  Location: [facility];  Service: Urology;  Laterality: Bilateral;   ETHMOIDECTOMY Bilateral 07/17/2009   anterior endoscopic , polypectomy, and bilateral maxillay antrostomy   HAND SURGERY Right    fingertip amputation   HOLMIUM LASER APPLICATION Bilateral 2/45/8099   Procedure: HOLMIUM LASER APPLICATION;  Surgeon: Nickie Retort, MD;  Location: [facility];  Service: Urology;  Laterality: Bilateral;   NASAL SINUS SURGERY  2016   at [place]    Family History  Problem Relation Age of Onset   Allergic rhinitis Mother    Breast cancer Mother    Lung cancer Father 39   Colon polyps Father    Prostate cancer Father    Allergic  rhinitis Daughter    Allergic rhinitis Son    Asthma Son    Colon cancer Neg Hx    Social History:  reports that he has never smoked. He has never used smokeless tobacco. He reports that he does not drink alcohol and does not use drugs.  Allergies:  Allergies  Allergen Reactions   Amlodipine     edema    Medications Prior to Admission  Medication Sig Dispense Refill   atorvastatin (LIPITOR) 20 MG tablet Take 20 mg by mouth at bedtime. Take 1 by mouth at bedtime     Cholecalciferol (VITAMIN D3) 50 MCG (2000 UT) capsule Take 1 capsule (2,000 Units total) by mouth daily. 100 capsule 3   diphenoxylate-atropine (LOMOTIL) 2.5-0.025 MG tablet TAKE 1 TABLET BY MOUTH FOUR TIMES DAILY AS NEEDED FOR DIARRHEA OR LOOSE STOOLS 120 tablet 0   losartan (COZAAR) 25 MG tablet Take by mouth. Take 1 by mouth every day     metoprolol succinate (TOPROL-XL) 25 MG 24 hr tablet TAKE 1 TABLET BY MOUTH EVERY DAY. ANNUAL APPT DUE IN JULY MUST SEE PROVIDER FOR FUTURE REFILLS 90 tablet 3   spironolactone (ALDACTONE) 25 MG tablet Take 25 mg by mouth daily. Take 1 by mouth every day     zolpidem (AMBIEN) 10 MG tablet TAKE 1/2 TO 1 TABLET(5 TO 10 MG) BY MOUTH AT BEDTIME AS  NEEDED FOR SLEEP 30 tablet 3   Avanafil (STENDRA) 100 MG TABS Take 100-200 mg by mouth daily as needed. 12 tablet 5   colesevelam (WELCHOL) 625 MG tablet Take 2 tablets (1,250 mg total) by mouth 2 (two) times daily with a meal. 120 tablet 11   Vedolizumab (ENTYVIO IV) Inject into the vein.     venlafaxine XR (EFFEXOR XR) 150 MG 24 hr capsule Take 1 capsule (150 mg total) by mouth daily with breakfast. (Patient not taking: Reported on 06/18/2021) 90 capsule 1    No results found for this or any previous visit (from the past 48 hour(s)). No results found.  Review of Systems  Constitutional:  Negative for chills and fever.  All other systems reviewed and are negative.  Blood pressure (!) 148/88, pulse 74, temperature 97.9 F (36.6 C), temperature  source Oral, resp. rate 18, height 6' 3"  (1.905 m), weight (!) 137 kg, SpO2 98 %. Physical Exam Vitals reviewed.  HENT:     Head: Normocephalic.  Eyes:     Pupils: Pupils are equal, round, and reactive to light.  Cardiovascular:     Rate and Rhythm: Normal rate.  Abdominal:     General: Abdomen is flat.     Comments: Significant truncal obesity.   Genitourinary:    Comments: NO CVAT at present Musculoskeletal:        General: Normal range of motion.     Cervical back: Normal range of motion.  Neurological:     General: No focal deficit present.     Mental Status: He is alert.  Psychiatric:        Mood and Affect: Mood normal.     Assessment/Plan  Proceed as planned with RIGHT shockwave lithotripsy. Risks, benefits, alternatives, expected peri-op course discussed.   Alexis Frock, MD 06/18/2021, 7:04 AM

## 2021-06-18 NOTE — Brief Op Note (Signed)
06/18/2021  8:48 AM  PATIENT:  [name]  46 y.o. male  PRE-OPERATIVE DIAGNOSIS:  RIGHT RENAL STONE  POST-OPERATIVE DIAGNOSIS:  * No post-op diagnosis entered *  PROCEDURE:  Procedure(s): EXTRACORPOREAL SHOCK WAVE LITHOTRIPSY (ESWL) (Right)  SURGEON:  Surgeon(s) and Role:    * Alexis Frock, MD - Primary  PHYSICIAN ASSISTANT:   ASSISTANTS: none   ANESTHESIA:   MAC  EBL:  minimal   BLOOD ADMINISTERED:none  DRAINS: none   LOCAL MEDICATIONS USED:  NONE  SPECIMEN:  No Specimen  DISPOSITION OF SPECIMEN:  N/A  COUNTS:  YES  TOURNIQUET:  * No tourniquets in log *  DICTATION: .Note written in paper chart  PLAN OF CARE: Discharge to home after PACU  PATIENT DISPOSITION:  Short Stay   Delay start of Pharmacological VTE agent (>24hrs) due to surgical blood loss or risk of bleeding: yes

## 2021-06-19 ENCOUNTER — Encounter (HOSPITAL_BASED_OUTPATIENT_CLINIC_OR_DEPARTMENT_OTHER): Payer: Self-pay | Admitting: Urology

## 2021-07-02 ENCOUNTER — Other Ambulatory Visit: Payer: Self-pay

## 2021-07-02 ENCOUNTER — Ambulatory Visit (INDEPENDENT_AMBULATORY_CARE_PROVIDER_SITE_OTHER): Payer: Managed Care, Other (non HMO) | Admitting: Internal Medicine

## 2021-07-02 ENCOUNTER — Encounter: Payer: Self-pay | Admitting: Internal Medicine

## 2021-07-02 VITALS — BP 134/88 | HR 65 | Temp 98.0°F | Ht 75.0 in | Wt 297.0 lb

## 2021-07-02 DIAGNOSIS — I10 Essential (primary) hypertension: Secondary | ICD-10-CM | POA: Diagnosis not present

## 2021-07-02 DIAGNOSIS — F4321 Adjustment disorder with depressed mood: Secondary | ICD-10-CM | POA: Diagnosis not present

## 2021-07-02 DIAGNOSIS — K5080 Crohn's disease of both small and large intestine without complications: Secondary | ICD-10-CM

## 2021-07-02 DIAGNOSIS — R197 Diarrhea, unspecified: Secondary | ICD-10-CM | POA: Diagnosis not present

## 2021-07-02 DIAGNOSIS — Z0000 Encounter for general adult medical examination without abnormal findings: Secondary | ICD-10-CM

## 2021-07-02 LAB — COMPREHENSIVE METABOLIC PANEL
ALT: 21 U/L (ref 0–53)
AST: 18 U/L (ref 0–37)
Albumin: 4.2 g/dL (ref 3.5–5.2)
Alkaline Phosphatase: 62 U/L (ref 39–117)
BUN: 19 mg/dL (ref 6–23)
CO2: 29 mEq/L (ref 19–32)
Calcium: 10.2 mg/dL (ref 8.4–10.5)
Chloride: 102 mEq/L (ref 96–112)
Creatinine, Ser: 1.45 mg/dL (ref 0.40–1.50)
GFR: 57.15 mL/min — ABNORMAL LOW (ref >60.00)
Glucose, Bld: 98 mg/dL (ref 70–99)
Potassium: 4.5 mEq/L (ref 3.5–5.1)
Sodium: 141 mEq/L (ref 135–145)
Total Bilirubin: 0.6 mg/dL (ref 0.2–1.2)
Total Protein: 7.6 g/dL (ref 6.0–8.3)

## 2021-07-02 LAB — LIPID PANEL
Cholesterol: 146 mg/dL (ref 0–200)
HDL: 35.6 mg/dL — ABNORMAL LOW (ref >39.00)
LDL Cholesterol: 94 mg/dL (ref 0–99)
NonHDL: 110.2
Total CHOL/HDL Ratio: 4
Triglycerides: 82 mg/dL (ref 0.0–149.0)
VLDL: 16.4 mg/dL (ref 0.0–40.0)

## 2021-07-02 LAB — VITAMIN D 25 HYDROXY (VIT D DEFICIENCY, FRACTURES): VITD: 37.4 ng/mL (ref 30.00–100.00)

## 2021-07-02 LAB — CBC WITH DIFFERENTIAL/PLATELET
Basophils Absolute: 0.1 10*3/uL (ref 0.0–0.1)
Basophils Relative: 1 % (ref 0.0–3.0)
Eosinophils Absolute: 0.2 10*3/uL (ref 0.0–0.7)
Eosinophils Relative: 2.7 % (ref 0.0–5.0)
HCT: 40.1 % (ref 39.0–52.0)
Hemoglobin: 13.5 g/dL (ref 13.0–17.0)
Lymphocytes Relative: 36.1 % (ref 12.0–46.0)
Lymphs Abs: 2.4 10*3/uL (ref 0.7–4.0)
MCHC: 33.7 g/dL (ref 30.0–36.0)
MCV: 85.4 fl (ref 78.0–100.0)
Monocytes Absolute: 0.5 10*3/uL (ref 0.1–1.0)
Monocytes Relative: 8.1 % (ref 3.0–12.0)
Neutro Abs: 3.4 10*3/uL (ref 1.4–7.7)
Neutrophils Relative %: 52.1 % (ref 43.0–77.0)
Platelets: 414 10*3/uL — ABNORMAL HIGH (ref 150.0–400.0)
RBC: 4.69 Mil/uL (ref 4.22–5.81)
RDW: 12.7 % (ref 11.5–15.5)
WBC: 6.6 10*3/uL (ref 4.0–10.5)

## 2021-07-02 LAB — TSH: TSH: 2.38 u[IU]/mL (ref 0.35–5.50)

## 2021-07-02 LAB — VITAMIN B12: Vitamin B-12: 424 pg/mL (ref 211–911)

## 2021-07-02 LAB — TESTOSTERONE: Testosterone: 328.88 ng/dL (ref 300.00–890.00)

## 2021-07-02 LAB — PSA: PSA: 0.24 ng/mL (ref 0.10–4.00)

## 2021-07-02 MED ORDER — DIPHENOXYLATE-ATROPINE 2.5-0.025 MG PO TABS: 1.0000 | ORAL_TABLET | Freq: Four times a day (QID) | ORAL | 1 refills | Status: AC | PRN

## 2021-07-02 MED ORDER — VENLAFAXINE HCL ER 150 MG PO CP24: 150.0000 mg | ORAL_CAPSULE | Freq: Every day | ORAL | 1 refills | Status: DC

## 2021-07-02 NOTE — Progress Notes (Signed)
? ?Subjective:  ?Patient ID: Casey Kelly, male    DOB: 16-Aug-1975  Age: 46 y.o. MRN: [id number] ? ?CC: Annual Exam (Would like liver and kidney function labs done along with other normal labs. ) ? ? ?HPI ?Casey Kelly presents for depression, colitis, renal stone f/u - s/p lithotripsy  ? ?Outpatient Medications Prior to Visit  ?Medication Sig Dispense Refill  ? atorvastatin (LIPITOR) 20 MG tablet Take 20 mg by mouth at bedtime. Take 1 by mouth at bedtime    ? Avanafil (STENDRA) 100 MG TABS Take 100-200 mg by mouth daily as needed. 12 tablet 5  ? Cholecalciferol (VITAMIN D3) 50 MCG (2000 UT) capsule Take 1 capsule (2,000 Units total) by mouth daily. 100 capsule 3  ? colesevelam (WELCHOL) 625 MG tablet Take 2 tablets (1,250 mg total) by mouth 2 (two) times daily with a meal. 120 tablet 11  ? losartan-hydrochlorothiazide (HYZAAR) 100-25 MG tablet Take 1 tablet by mouth daily.    ? metoprolol succinate (TOPROL-XL) 25 MG 24 hr tablet TAKE 1 TABLET BY MOUTH EVERY DAY. ANNUAL APPT DUE IN JULY MUST SEE PROVIDER FOR FUTURE REFILLS 90 tablet 3  ? ondansetron (ZOFRAN-ODT) 8 MG disintegrating tablet Take 1 tablet (8 mg total) by mouth every 8 (eight) hours as needed for nausea or vomiting. 10 tablet 0  ? oxyCODONE-acetaminophen (PERCOCET) 5-325 MG tablet Take 1 tablet by mouth every 6 (six) hours as needed for severe pain or moderate pain (after lithotripsy). 20 tablet 0  ? senna-docusate (SENOKOT-S) 8.6-50 MG tablet Take 1 tablet by mouth 2 (two) times daily. While taking strong pai meds to prevent constipation. 10 tablet 0  ? spironolactone (ALDACTONE) 25 MG tablet Take 25 mg by mouth daily. Take 1 by mouth every day    ? tamsulosin (FLOMAX) 0.4 MG CAPS capsule Take 1 capsule (0.4 mg total) by mouth daily. To help pass stone fragments after lithotripsy 14 capsule 0  ? Vedolizumab (ENTYVIO IV) Inject into the vein.    ? zolpidem (AMBIEN) 10 MG tablet TAKE 1/2 TO 1 TABLET(5 TO 10 MG) BY MOUTH AT BEDTIME AS NEEDED FOR SLEEP  30 tablet 3  ? diphenoxylate-atropine (LOMOTIL) 2.5-0.025 MG tablet TAKE 1 TABLET BY MOUTH FOUR TIMES DAILY AS NEEDED FOR DIARRHEA OR LOOSE STOOLS 120 tablet 0  ? losartan (COZAAR) 25 MG tablet Take by mouth. Take 1 by mouth every day    ? venlafaxine XR (EFFEXOR XR) 150 MG 24 hr capsule Take 1 capsule (150 mg total) by mouth daily with breakfast. 90 capsule 1  ? ?No facility-administered medications prior to visit.  ? ? ?ROS: ?Review of Systems  ?Constitutional:  Positive for fatigue. Negative for appetite change and unexpected weight change.  ?HENT:  Negative for congestion, nosebleeds, sneezing, sore throat and trouble swallowing.   ?Eyes:  Negative for itching and visual disturbance.  ?Respiratory:  Negative for cough.   ?Cardiovascular:  Negative for chest pain, palpitations and leg swelling.  ?Gastrointestinal:  Positive for abdominal pain and diarrhea. Negative for abdominal distention, blood in stool and nausea.  ?Genitourinary:  Negative for flank pain, frequency, hematuria and scrotal swelling.  ?Musculoskeletal:  Negative for back pain, gait problem, joint swelling and neck pain.  ?Skin:  Negative for rash.  ?Neurological:  Negative for dizziness, tremors, speech difficulty and weakness.  ?Psychiatric/Behavioral:  Negative for agitation, dysphoric mood and sleep disturbance. The patient is not nervous/anxious.   ? ?Objective:  ?BP 134/88 (BP Location: Left Arm, Patient Position: Sitting, Cuff  Size: Large)   Pulse 65   Temp 98 ?F (36.7 ?C) (Oral)   Ht 6' 3"  (1.905 m)   Wt 297 lb (134.7 kg)   SpO2 97%   BMI 37.12 kg/m?  ? ?BP Readings from Last 3 Encounters:  ?07/02/21 134/88  ?06/18/21 (!) 148/88  ?11/02/20 (!) 142/90  ? ? ?Wt Readings from Last 3 Encounters:  ?07/02/21 297 lb (134.7 kg)  ?06/18/21 (!) 302 lb (137 kg)  ?11/02/20 (!) 301 lb 3.2 oz (136.6 kg)  ? ? ?Physical Exam ?Constitutional:   ?   General: He is not in acute distress. ?   Appearance: He is well-developed.  ?   Comments: NAD  ?Eyes:   ?   Conjunctiva/sclera: Conjunctivae normal.  ?   Pupils: Pupils are equal, round, and reactive to light.  ?Neck:  ?   Thyroid: No thyromegaly.  ?   Vascular: No JVD.  ?Cardiovascular:  ?   Rate and Rhythm: Normal rate and regular rhythm.  ?   Heart sounds: Normal heart sounds. No murmur heard. ?  No friction rub. No gallop.  ?Pulmonary:  ?   Effort: Pulmonary effort is normal. No respiratory distress.  ?   Breath sounds: Normal breath sounds. No wheezing or rales.  ?Chest:  ?   Chest wall: No tenderness.  ?Abdominal:  ?   General: Bowel sounds are normal. There is no distension.  ?   Palpations: Abdomen is soft. There is no mass.  ?   Tenderness: There is no abdominal tenderness. There is no guarding or rebound.  ?Musculoskeletal:     ?   General: No tenderness. Normal range of motion.  ?   Cervical back: Normal range of motion.  ?Lymphadenopathy:  ?   Cervical: No cervical adenopathy.  ?Skin: ?   General: Skin is warm and dry.  ?   Findings: No rash.  ?Neurological:  ?   Mental Status: He is alert and oriented to person, place, and time.  ?   Cranial Nerves: No cranial nerve deficit.  ?   Motor: No abnormal muscle tone.  ?   Coordination: Coordination normal.  ?   Gait: Gait normal.  ?   Deep Tendon Reflexes: Reflexes are normal and symmetric.  ?Psychiatric:     ?   Behavior: Behavior normal.     ?   Thought Content: Thought content normal.     ?   Judgment: Judgment normal.  ? ? ?Lab Results  ?Component Value Date  ? WBC 5.7 06/28/2020  ? HGB 14.7 06/28/2020  ? HCT 43.8 06/28/2020  ? PLT 342.0 06/28/2020  ? GLUCOSE 95 06/28/2020  ? CHOL 191 06/28/2020  ? TRIG 92.0 06/28/2020  ? HDL 39.60 06/28/2020  ? LDLDIRECT 196.0 12/08/2007  ? LDLCALC 133 (H) 06/28/2020  ? ALT 26 06/28/2020  ? AST 24 06/28/2020  ? NA 139 06/28/2020  ? K 4.0 06/28/2020  ? CL 105 06/28/2020  ? CREATININE 1.07 06/28/2020  ? BUN 15 06/28/2020  ? CO2 28 06/28/2020  ? TSH 1.46 06/28/2020  ? PSA 0.20 06/28/2020  ? INR 0.9 10/10/2010  ? HGBA1C 5.8  06/28/2020  ? ? ?DG Abd 1 View ? ?Result Date: 06/18/2021 ?CLINICAL DATA:  Nephrolithiasis EXAM: ABDOMEN - 1 VIEW COMPARISON:  05/07/2021 FINDINGS: The bowel gas pattern is normal. Calcification projecting over the right renal shadow measuring 1.9 x 0.9 cm. No acute bony findings. IMPRESSION: 1.9 x 0.9 cm calcification projecting over the right renal shadow.  Electronically Signed   By: Davina Poke D.O.   On: 06/18/2021 08:01  ? ? ?Assessment & Plan:  ? ?Problem List Items Addressed This Visit   ? ? Crohn disease ([place])  ?   Continue with Entyvio infusions, Lomotil as needed ?  ?  ? Relevant Orders  ? Testosterone  ? Vitamin B12  ? VITAMIN D 25 Hydroxy (Vit-D Deficiency, Fractures)  ? Diarrhea  ?   Continue with Entyvio infusions, Lomotil as needed ?  ?  ? Relevant Orders  ? Vitamin B12  ? VITAMIN D 25 Hydroxy (Vit-D Deficiency, Fractures)  ? Essential hypertension  ?   Metoprolol, Losartan, Spironolactone ?  ?  ? Relevant Medications  ? losartan-hydrochlorothiazide (HYZAAR) 100-25 MG tablet  ? Situational depression  ?   Cont on Effexor XR ?  ?  ? Relevant Medications  ? venlafaxine XR (EFFEXOR XR) 150 MG 24 hr capsule  ? Other Relevant Orders  ? Testosterone  ? Vitamin B12  ? Well adult exam - Primary  ? Relevant Orders  ? CBC with Differential/Platelet  ? Lipid panel  ? Comprehensive metabolic panel  ? PSA  ? TSH  ?  ? ? ?Meds ordered this encounter  ?Medications  ? venlafaxine XR (EFFEXOR XR) 150 MG 24 hr capsule  ?  Sig: Take 1 capsule (150 mg total) by mouth daily with breakfast.  ?  Dispense:  90 capsule  ?  Refill:  1  ? diphenoxylate-atropine (LOMOTIL) 2.5-0.025 MG tablet  ?  Sig: Take 1-2 tablets by mouth 4 (four) times daily as needed for diarrhea or loose stools. TAKE 1 TABLET BY MOUTH FOUR TIMES DAILY AS NEEDED FOR DIARRHEA OR LOOSE STOOLS  ?  Dispense:  120 tablet  ?  Refill:  1  ?  ? ? ?Follow-up: Return in about 3 months (around 10/02/2021) for a follow-up visit. ? ?Walker Kehr, MD ?

## 2021-07-02 NOTE — Assessment & Plan Note (Signed)
Continue with Entyvio infusions, Lomotil as needed ?

## 2021-07-02 NOTE — Assessment & Plan Note (Signed)
Metoprolol, Losartan, Spironolactone ?

## 2021-07-02 NOTE — Assessment & Plan Note (Signed)
Cont on Effexor XR ?

## 2021-08-14 ENCOUNTER — Other Ambulatory Visit: Payer: Self-pay | Admitting: Internal Medicine

## 2021-08-14 MED ORDER — STENDRA 100 MG PO TABS: ORAL_TABLET | ORAL | 5 refills | Status: DC

## 2021-08-14 NOTE — Telephone Encounter (Signed)
1st transmission failed refax [facility] to [facility] ?

## 2021-08-14 NOTE — Addendum Note (Signed)
Addended by: Earnstine Regal on: 08/14/2021 04:24 PM ? ? Modules accepted: Orders ? ?

## 2021-08-16 ENCOUNTER — Other Ambulatory Visit: Payer: Self-pay | Admitting: *Deleted

## 2021-08-16 MED ORDER — STENDRA 100 MG PO TABS: ORAL_TABLET | ORAL | 5 refills | Status: DC

## 2021-08-17 ENCOUNTER — Telehealth: Payer: Self-pay | Admitting: *Deleted

## 2021-08-17 ENCOUNTER — Other Ambulatory Visit: Payer: Self-pay | Admitting: Internal Medicine

## 2021-08-17 MED ORDER — STENDRA 100 MG PO TABS: ORAL_TABLET | ORAL | 5 refills | Status: DC

## 2021-08-17 NOTE — Telephone Encounter (Signed)
Pt states pharmacy is not getting rx for Avaafil. Inform Caryl Pina (scheduler) that rx was sent twice. Pt can come by to pick rx up. Will leave at front desk for pick up.Marland KitchenJohny Chess ?

## 2021-08-17 NOTE — Telephone Encounter (Signed)
Rx will not go through.[name] kepp getting msg Transmission to pharmacy failed . Faxing rx manually to [facility]../[facility] ?

## 2021-10-07 ENCOUNTER — Other Ambulatory Visit: Payer: Self-pay | Admitting: Internal Medicine

## 2021-10-08 NOTE — Telephone Encounter (Signed)
Check [place] registry last filled 04/23/2021../lm,b

## 2021-10-09 ENCOUNTER — Encounter: Payer: Self-pay | Admitting: Internal Medicine

## 2021-10-09 ENCOUNTER — Telehealth: Payer: Self-pay

## 2021-10-09 ENCOUNTER — Ambulatory Visit (INDEPENDENT_AMBULATORY_CARE_PROVIDER_SITE_OTHER): Payer: Managed Care, Other (non HMO) | Admitting: Internal Medicine

## 2021-10-09 DIAGNOSIS — I10 Essential (primary) hypertension: Secondary | ICD-10-CM

## 2021-10-09 DIAGNOSIS — F5102 Adjustment insomnia: Secondary | ICD-10-CM | POA: Diagnosis not present

## 2021-10-09 DIAGNOSIS — K1379 Other lesions of oral mucosa: Secondary | ICD-10-CM

## 2021-10-09 DIAGNOSIS — N529 Male erectile dysfunction, unspecified: Secondary | ICD-10-CM | POA: Diagnosis not present

## 2021-10-09 DIAGNOSIS — K5080 Crohn's disease of both small and large intestine without complications: Secondary | ICD-10-CM

## 2021-10-09 MED ORDER — METRONIDAZOLE 500 MG PO TABS: 500.0000 mg | ORAL_TABLET | Freq: Three times a day (TID) | ORAL | 1 refills | Status: DC

## 2021-10-09 MED ORDER — ZOLPIDEM TARTRATE 10 MG PO TABS
ORAL_TABLET | ORAL | 1 refills | Status: DC
Start: 2021-10-09 — End: 2022-07-01

## 2021-10-09 MED ORDER — STENDRA 100 MG PO TABS: ORAL_TABLET | ORAL | 5 refills | Status: DC

## 2021-10-09 NOTE — Assessment & Plan Note (Signed)
Cont on Zolpidem prn - rare  Potential benefits of a long term benzodiazepines  use as well as potential risks  and complications were explained to the patient and were aknowledged. CPAP - not using

## 2021-10-09 NOTE — Telephone Encounter (Signed)
PA started...  Key: [id number]

## 2021-10-09 NOTE — Telephone Encounter (Signed)
The pt has stated "Dr. Alain Marion I forgot to ask you about testosterone meds. In the past you gave me the injection.  Can we start the injections again or whatever that's best for my situation?"

## 2021-10-09 NOTE — Progress Notes (Signed)
Subjective:  Patient ID: Casey Kelly, male    DOB: 1976/01/11  Age: 46 y.o. MRN: [id number]  CC: No chief complaint on file.   HPI Casey Kelly presents for insomnia, colitis, ED  Mid hard palate white lesion <1 cm lesion  Outpatient Medications Prior to Visit  Medication Sig Dispense Refill   atorvastatin (LIPITOR) 20 MG tablet Take 20 mg by mouth at bedtime. Take 1 by mouth at bedtime     Cholecalciferol (VITAMIN D3) 50 MCG (2000 UT) capsule Take 1 capsule (2,000 Units total) by mouth daily. 100 capsule 3   colesevelam (WELCHOL) 625 MG tablet Take 2 tablets (1,250 mg total) by mouth 2 (two) times daily with a meal. 120 tablet 11   diphenoxylate-atropine (LOMOTIL) 2.5-0.025 MG tablet Take 1-2 tablets by mouth 4 (four) times daily as needed for diarrhea or loose stools. TAKE 1 TABLET BY MOUTH FOUR TIMES DAILY AS NEEDED FOR DIARRHEA OR LOOSE STOOLS 120 tablet 1   losartan-hydrochlorothiazide (HYZAAR) 100-25 MG tablet Take 1 tablet by mouth daily.     metoprolol succinate (TOPROL-XL) 25 MG 24 hr tablet TAKE 1 TABLET BY MOUTH EVERY DAY. ANNUAL APPT DUE IN JULY MUST SEE PROVIDER FOR FUTURE REFILLS 90 tablet 3   ondansetron (ZOFRAN-ODT) 8 MG disintegrating tablet Take 1 tablet (8 mg total) by mouth every 8 (eight) hours as needed for nausea or vomiting. 10 tablet 0   Vedolizumab (ENTYVIO IV) Inject into the vein.     venlafaxine XR (EFFEXOR XR) 150 MG 24 hr capsule Take 1 capsule (150 mg total) by mouth daily with breakfast. 90 capsule 1   Avanafil (STENDRA) 100 MG TABS TAKE 1 TO 2 TABLETS EVERY DAY AS NEEDED 8 tablet 5   oxyCODONE-acetaminophen (PERCOCET) 5-325 MG tablet Take 1 tablet by mouth every 6 (six) hours as needed for severe pain or moderate pain (after lithotripsy). 20 tablet 0   senna-docusate (SENOKOT-S) 8.6-50 MG tablet Take 1 tablet by mouth 2 (two) times daily. While taking strong pai meds to prevent constipation. 10 tablet 0   spironolactone (ALDACTONE) 25 MG tablet Take  25 mg by mouth daily. Take 1 by mouth every day     tamsulosin (FLOMAX) 0.4 MG CAPS capsule Take 1 capsule (0.4 mg total) by mouth daily. To help pass stone fragments after lithotripsy 14 capsule 0   zolpidem (AMBIEN) 10 MG tablet TAKE 1/2 TO 1 TABLET(5 TO 10 MG) BY MOUTH AT BEDTIME AS NEEDED FOR SLEEP 30 tablet 3   No facility-administered medications prior to visit.    ROS: Review of Systems  Constitutional:  Negative for appetite change, fatigue and unexpected weight change.  HENT:  Negative for congestion, nosebleeds, sneezing, sore throat and trouble swallowing.   Eyes:  Negative for itching and visual disturbance.  Respiratory:  Negative for cough.   Cardiovascular:  Negative for chest pain, palpitations and leg swelling.  Gastrointestinal:  Positive for diarrhea. Negative for abdominal distention, blood in stool and nausea.  Genitourinary:  Negative for frequency and hematuria.  Musculoskeletal:  Negative for back pain, gait problem, joint swelling and neck pain.  Skin:  Negative for rash.  Neurological:  Negative for dizziness, tremors, speech difficulty and weakness.  Psychiatric/Behavioral:  Positive for sleep disturbance. Negative for agitation, dysphoric mood and suicidal ideas. The patient is nervous/anxious.     Objective:  BP 130/86 (BP Location: Left Arm, Patient Position: Sitting, Cuff Size: Large)   Pulse 79   Temp 98.9 F (37.2 C) (Oral)  Ht 6' 3"  (1.905 m)   Wt (!) 304 lb (137.9 kg)   SpO2 96%   BMI 38.00 kg/m   BP Readings from Last 3 Encounters:  10/09/21 130/86  07/02/21 134/88  06/18/21 (!) 148/88    Wt Readings from Last 3 Encounters:  10/09/21 (!) 304 lb (137.9 kg)  07/02/21 297 lb (134.7 kg)  06/18/21 (!) 302 lb (137 kg)    Physical Exam Constitutional:      General: He is not in acute distress.    Appearance: He is well-developed. He is obese.     Comments: NAD  Eyes:     Conjunctiva/sclera: Conjunctivae normal.     Pupils: Pupils are  equal, round, and reactive to light.  Neck:     Thyroid: No thyromegaly.     Vascular: No JVD.  Cardiovascular:     Rate and Rhythm: Normal rate and regular rhythm.     Heart sounds: Normal heart sounds. No murmur heard.    No friction rub. No gallop.  Pulmonary:     Effort: Pulmonary effort is normal. No respiratory distress.     Breath sounds: Normal breath sounds. No wheezing or rales.  Chest:     Chest wall: No tenderness.  Abdominal:     General: Bowel sounds are normal. There is no distension.     Palpations: Abdomen is soft. There is no mass.     Tenderness: There is no abdominal tenderness. There is no guarding or rebound.  Musculoskeletal:        General: No tenderness. Normal range of motion.     Cervical back: Normal range of motion.  Lymphadenopathy:     Cervical: No cervical adenopathy.  Skin:    General: Skin is warm and dry.     Findings: No rash.  Neurological:     Mental Status: He is alert and oriented to person, place, and time.     Cranial Nerves: No cranial nerve deficit.     Motor: No abnormal muscle tone.     Coordination: Coordination normal.     Gait: Gait normal.     Deep Tendon Reflexes: Reflexes are normal and symmetric.  Psychiatric:        Behavior: Behavior normal.        Thought Content: Thought content normal.        Judgment: Judgment normal.   Mid palate w/a <1 cm lesion  Lab Results  Component Value Date   WBC 6.6 07/02/2021   HGB 13.5 07/02/2021   HCT 40.1 07/02/2021   PLT 414.0 (H) 07/02/2021   GLUCOSE 98 07/02/2021   CHOL 146 07/02/2021   TRIG 82.0 07/02/2021   HDL 35.60 (L) 07/02/2021   LDLDIRECT 196.0 12/08/2007   LDLCALC 94 07/02/2021   ALT 21 07/02/2021   AST 18 07/02/2021   NA 141 07/02/2021   K 4.5 07/02/2021   CL 102 07/02/2021   CREATININE 1.45 07/02/2021   BUN 19 07/02/2021   CO2 29 07/02/2021   TSH 2.38 07/02/2021   PSA 0.24 07/02/2021   INR 0.9 10/10/2010   HGBA1C 5.8 06/28/2020    DG Abd 1  View  Result Date: 06/18/2021 CLINICAL DATA:  Nephrolithiasis EXAM: ABDOMEN - 1 VIEW COMPARISON:  05/07/2021 FINDINGS: The bowel gas pattern is normal. Calcification projecting over the right renal shadow measuring 1.9 x 0.9 cm. No acute bony findings. IMPRESSION: 1.9 x 0.9 cm calcification projecting over the right renal shadow. Electronically Signed   By: Hart Carwin  Plundo D.O.   On: 06/18/2021 08:01    Assessment & Plan:   Problem List Items Addressed This Visit     Crohn disease ([place])    Continue with Entyvio infusions, Lomotil as needed       Erectile dysfunction    Cont on prn Stendra      Essential hypertension    Cont on Metoprolol, Losartan Spironolactone      Relevant Medications   Avanafil (STENDRA) 100 MG TABS   Insomnia    Cont on Zolpidem prn - rare  Potential benefits of a long term benzodiazepines  use as well as potential risks  and complications were explained to the patient and were aknowledged. CPAP - not using      Palatal lesion    Mid hard palate white lesion <1 cm lesion ?etiology ENT consult for ?bx      Relevant Orders   Ambulatory referral to ENT      Meds ordered this encounter  Medications   Avanafil (STENDRA) 100 MG TABS    Sig: TAKE 1 TO 2 TABLETS EVERY DAY AS NEEDED    Dispense:  25 tablet    Refill:  5    NEED MORE REFILLS   zolpidem (AMBIEN) 10 MG tablet    Sig: TAKE 1/2 TO 1 TABLET(5 TO 10 MG) BY MOUTH AT BEDTIME AS NEEDED FOR SLEEP    Dispense:  90 tablet    Refill:  1   metroNIDAZOLE (FLAGYL) 500 MG tablet    Sig: Take 1 tablet (500 mg total) by mouth 3 (three) times daily.    Dispense:  30 tablet    Refill:  1      Follow-up: Return in about 6 months (around 04/10/2022) for a follow-up visit.  Walker Kehr, MD

## 2021-10-09 NOTE — Assessment & Plan Note (Addendum)
Cont on Metoprolol, Losartan Spironolactone

## 2021-10-09 NOTE — Telephone Encounter (Signed)
This request has been approved Coverage Start Date:09/09/2021 Coverage End Date:10/09/2022

## 2021-10-09 NOTE — Assessment & Plan Note (Signed)
Mid hard palate white lesion <1 cm lesion ?etiology ENT consult for ?bx

## 2021-10-09 NOTE — Assessment & Plan Note (Signed)
Cont on prn Casey Kelly

## 2021-10-09 NOTE — Assessment & Plan Note (Signed)
Continue with Entyvio infusions, Lomotil as needed

## 2021-10-10 ENCOUNTER — Other Ambulatory Visit: Payer: Self-pay | Admitting: Internal Medicine

## 2022-02-12 ENCOUNTER — Telehealth: Payer: Self-pay

## 2022-02-12 NOTE — Telephone Encounter (Signed)
Patient is calling in wanting to know if it is safe for him to take a supplement called Nitric Oxide along with all of his other medications, especially with Avanafil (STENDRA) 100 MG TABS.

## 2022-02-13 NOTE — Telephone Encounter (Signed)
I am not familiar with the supplement. I would not take the supplement on the days when he takes [place] as needed. Thank you

## 2022-02-13 NOTE — Telephone Encounter (Signed)
Notified pt w/ MD response. Pt states the [place] doesn't work can he have rx for viagra.Marland KitchenJohny Kelly

## 2022-02-14 ENCOUNTER — Telehealth: Payer: Self-pay | Admitting: *Deleted

## 2022-02-14 MED ORDER — SILDENAFIL CITRATE 100 MG PO TABS: 100.0000 mg | ORAL_TABLET | Freq: Every day | ORAL | 5 refills | Status: DC | PRN

## 2022-02-14 NOTE — Addendum Note (Signed)
Addended by: Cassandria Anger on: 02/14/2022 06:36 AM   Modules accepted: Orders

## 2022-02-14 NOTE — Telephone Encounter (Signed)
Rec determination med was APPROVED. Effective 01/15/22 through 02/14/2023. Faxing approval to pof.Marland KitchenJohny Chess

## 2022-02-14 NOTE — Telephone Encounter (Signed)
Notified pt w/MD response.../lmb 

## 2022-02-14 NOTE — Telephone Encounter (Signed)
Okay. Thank you.

## 2022-02-14 NOTE — Telephone Encounter (Signed)
Pt was on cover-my-meds need PA on Viagra. Submitted w/(Key: [id number]) PA sent to [facility].Marland KitchenJohny Chess

## 2022-03-09 IMAGING — CT CT CARDIAC CORONARY ARTERY CALCIUM SCORE
3 series · 14 of 20 positions shown, 15 images · non-contrast
Comparison: None.
COMPARISON: None.

Addendum:
EXAM:
OVER-READ INTERPRETATION  CT CHEST

The following report is an over-read performed by radiologist Dr.
Shamase Kanono [REDACTED] on 12/05/2020. This
over-read does not include interpretation of cardiac or coronary
anatomy or pathology. The coronary calcium score interpretation by
the cardiologist is attached.
CLINICAL DATA: Cardiovascular Disease Risk stratification
Coronary Calcium Score
TECHNIQUE: A gated, non-contrast computed tomography scan of the heart was
performed using 3mm slice thickness. Axial images were analyzed on a
dedicated workstation. Calcium scoring of the coronary arteries was
performed using the Agatston method.

[Series 2: casc 3.0 bv41 2 bestsyst 296 ms · axial · 0.44mm/px · z∈[-257,-167]mm · 4 of 52 slices shown, 5 images]
[im 11/52  vessel]
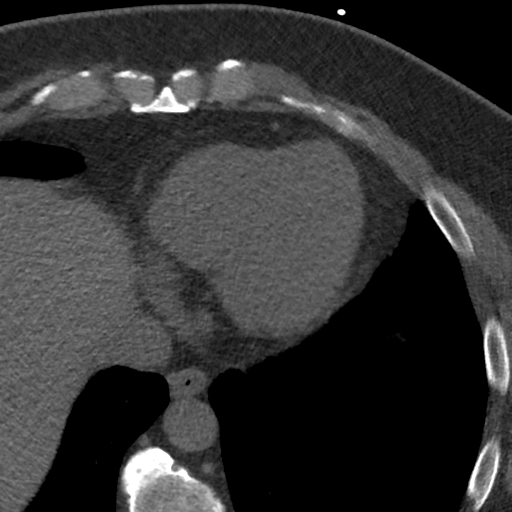
[im 11/52  lung]
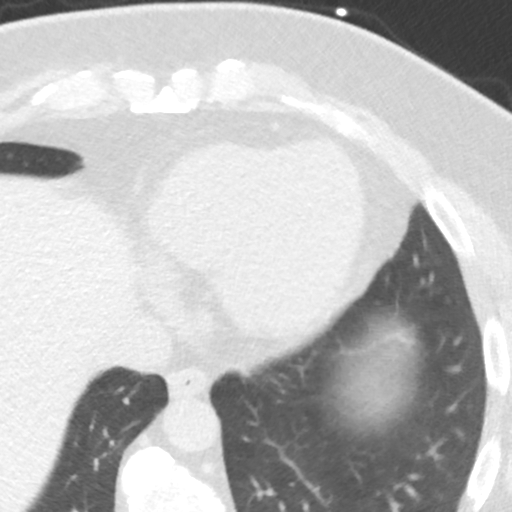
[im 21/52  vessel]
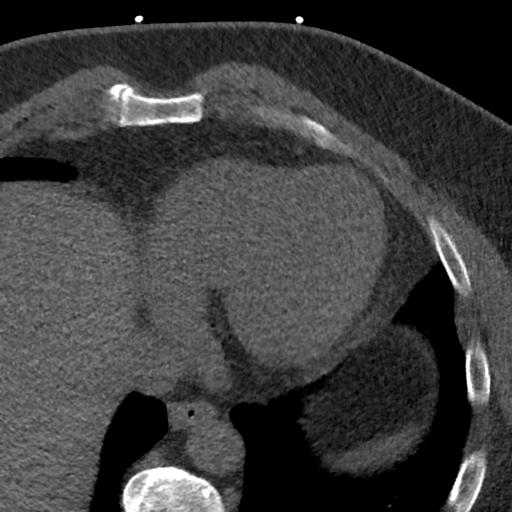
[im 31/52  vessel]
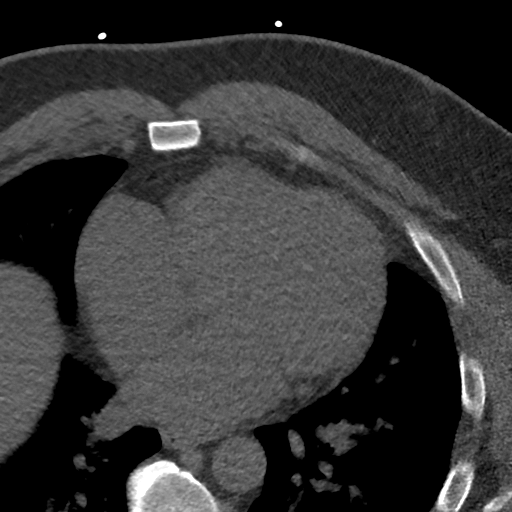
[im 41/52  vessel]
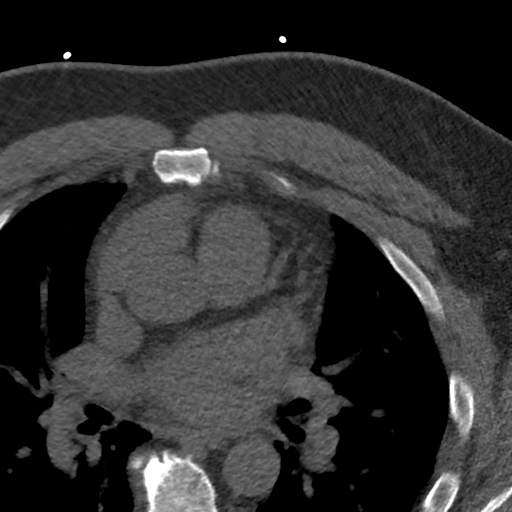

[Series 3: lung 296 ms · axial · 0.74mm/px · z∈[-263,-161]mm · 5 of 52 slices shown]
[im 9/52  lung]
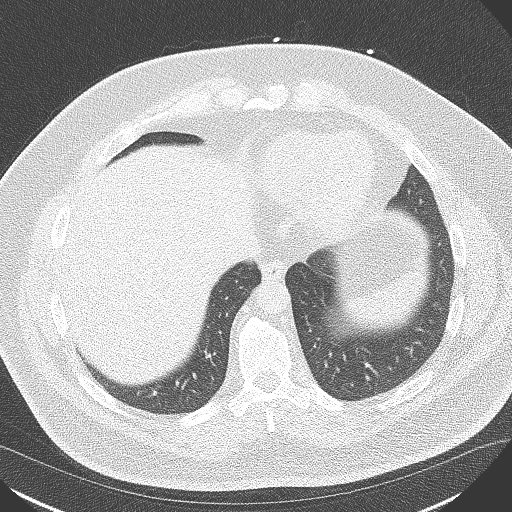
[im 18/52  lung]
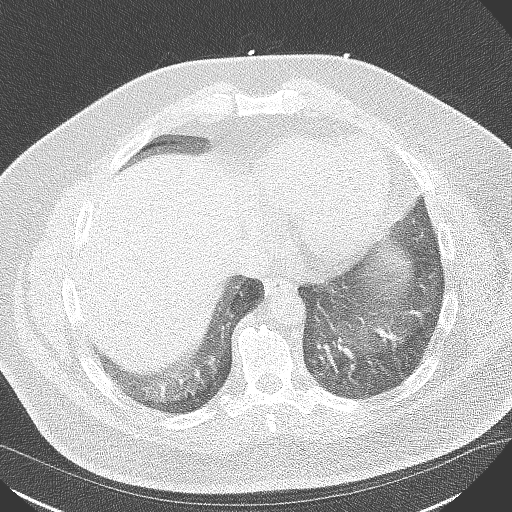
[im 26/52  lung]
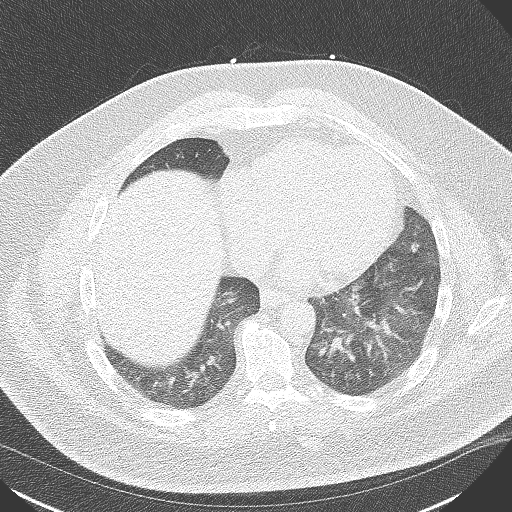
[im 35/52  lung]
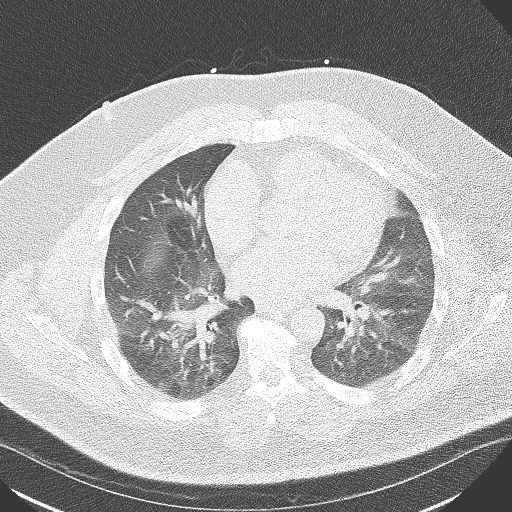
[im 43/52  lung]
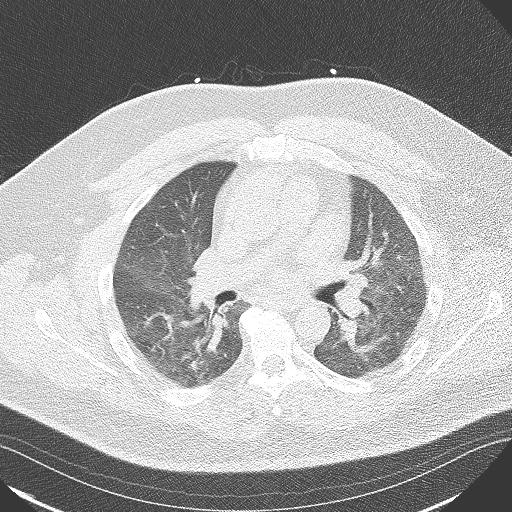

[Series 4: lung st 296 ms · axial · 0.74mm/px · z∈[-263,-161]mm · 5 of 52 slices shown]
[im 9/52  lung]
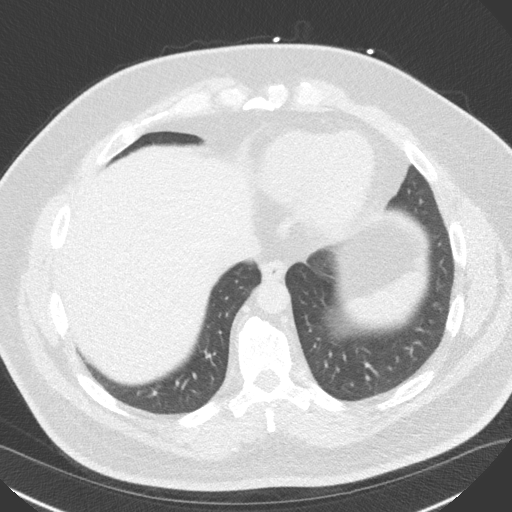
[im 18/52  lung]
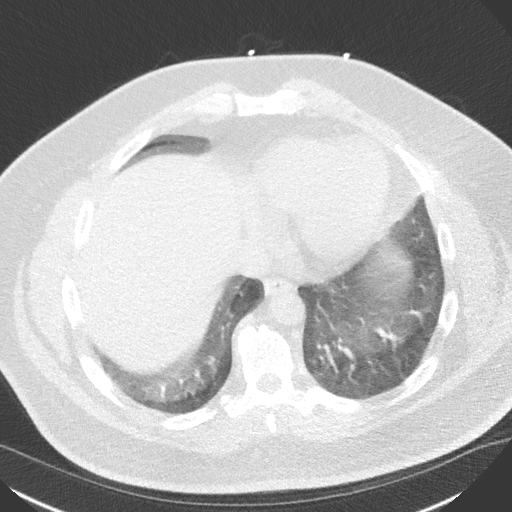
[im 26/52  lung]
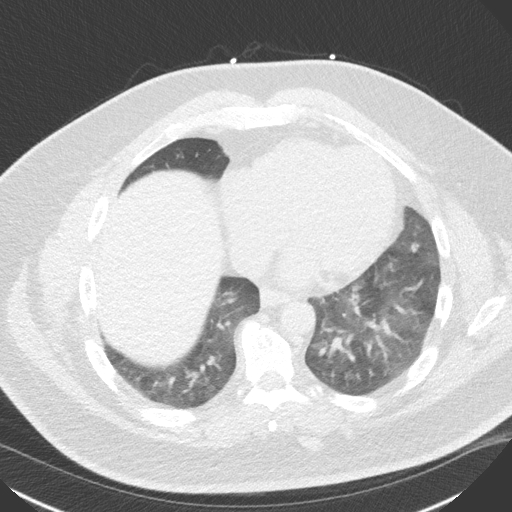
[im 35/52  lung]
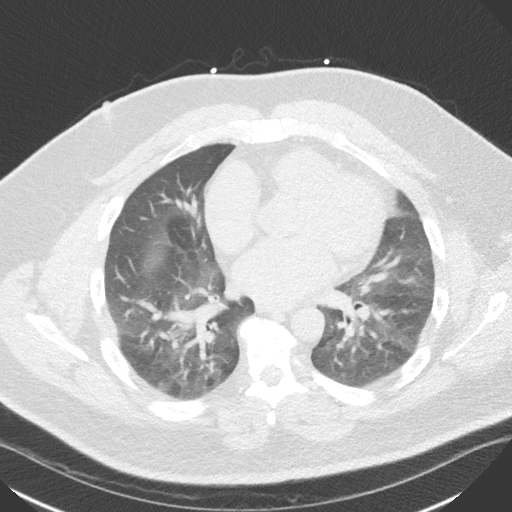
[im 43/52  lung]
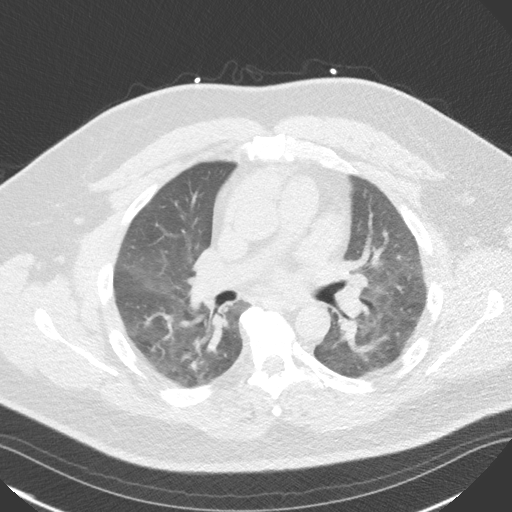

[14 of 20 positions shown; findings below may reference images not displayed]

FINDINGS: Within the visualized portions of the thorax there are no suspicious
appearing pulmonary nodules or masses, there is no acute
consolidative airspace disease, no pleural effusions, no
pneumothorax and no lymphadenopathy. Visualized portions of the
upper abdomen are unremarkable. There are no aggressive appearing
lytic or blastic lesions noted in the visualized portions of the
skeleton.
IMPRESSION: 1. No significant incidental noncardiac findings are noted.
FINDINGS: Coronary arteries: Normal origins.

Coronary Calcium Score:

Left main: 0

Left anterior descending artery: 3

Left circumflex artery: 1

Right coronary artery: 0

Total: 4

Percentile: 81

Pericardium: Normal.

Ascending Aorta: Normal caliber.

Non-cardiac: See separate report from [REDACTED].
IMPRESSION: Coronary calcium score of 4. This was 81st percentile for age-,
race-, and sex-matched controls.



If CAC=0, it is reasonable to withhold statin therapy and reassess
in 5 to 10 years, as long as higher risk conditions are absent
(diabetes mellitus, family history of premature CHD in first degree
relatives (males <55 years; females <65 years), cigarette smoking,
or LDL >=190 mg/dL).

If CAC is 1 to 99, it is reasonable to initiate statin therapy for
patients >=55 years of age.

If CAC is >=100 or >=75th percentile, it is reasonable to initiate
statin therapy at any age.

Cardiology referral should be considered for patients with CAC
scores >=400 or >=75th percentile.

*3004 AHA/ACC/AACVPR/AAPA/ABC/RIVERSON/TIGER/GYONGYIKE/Tiger/KETIA/VIET/KEISLER
Guideline on the Management of Blood Cholesterol: A Report of the
American College of Cardiology/American Heart Association Task Force
on Clinical Practice Guidelines. J Am Coll Cardiol.
2715;73(24):7675-7384.

*** End of Addendum ***
EXAM:
OVER-READ INTERPRETATION  CT CHEST

The following report is an over-read performed by radiologist Dr.
Shamase Kanono [REDACTED] on 12/05/2020. This
over-read does not include interpretation of cardiac or coronary
anatomy or pathology. The coronary calcium score interpretation by
the cardiologist is attached.
FINDINGS: Within the visualized portions of the thorax there are no suspicious
appearing pulmonary nodules or masses, there is no acute
consolidative airspace disease, no pleural effusions, no
pneumothorax and no lymphadenopathy. Visualized portions of the
upper abdomen are unremarkable. There are no aggressive appearing
lytic or blastic lesions noted in the visualized portions of the
skeleton.
IMPRESSION: 1. No significant incidental noncardiac findings are noted.

## 2022-03-11 ENCOUNTER — Encounter: Payer: Self-pay | Admitting: Internal Medicine

## 2022-03-11 ENCOUNTER — Ambulatory Visit: Payer: Managed Care, Other (non HMO) | Admitting: Internal Medicine

## 2022-03-11 VITALS — BP 128/72 | HR 90 | Temp 97.7°F | Ht 75.0 in | Wt 301.0 lb

## 2022-03-11 DIAGNOSIS — K5080 Crohn's disease of both small and large intestine without complications: Secondary | ICD-10-CM

## 2022-03-11 DIAGNOSIS — E669 Obesity, unspecified: Secondary | ICD-10-CM

## 2022-03-11 DIAGNOSIS — R944 Abnormal results of kidney function studies: Secondary | ICD-10-CM | POA: Diagnosis not present

## 2022-03-11 MED ORDER — LOSARTAN POTASSIUM-HCTZ 100-25 MG PO TABS: 0.5000 | ORAL_TABLET | Freq: Every day | ORAL | 1 refills | Status: AC

## 2022-03-11 NOTE — Assessment & Plan Note (Addendum)
GFR 58-78 in the past 2-3 years Will ref to see Dr Posey Pronto Decreasing GFR, likely multifactorial (meds, periodic diarrhea due to Crohn's) - the pt is very worried. Reduce Hyzaar to 0.5 tab Monitor labs Hydrate well

## 2022-03-11 NOTE — Progress Notes (Signed)
Subjective:  Patient ID: Casey Kelly, male    DOB: 11/16/1975  Age: 46 y.o. MRN: [id number]  CC: referral (To [place] [facility])   HPI JAIDAN PREVETTE presents for h/o decreased GFR Can I use Zepbound? BP - low when getting infusions 105/60 He is being treated for HTN  Outpatient Medications Prior to Visit  Medication Sig Dispense Refill   atorvastatin (LIPITOR) 20 MG tablet Take 20 mg by mouth at bedtime. Take 1 by mouth at bedtime     Cholecalciferol (VITAMIN D3) 50 MCG (2000 UT) capsule Take 1 capsule (2,000 Units total) by mouth daily. 100 capsule 3   dicyclomine (BENTYL) 10 MG capsule Take 10 mg by mouth 4 (four) times daily -  before meals and at bedtime.     diphenoxylate-atropine (LOMOTIL) 2.5-0.025 MG tablet Take 1-2 tablets by mouth 4 (four) times daily as needed for diarrhea or loose stools. TAKE 1 TABLET BY MOUTH FOUR TIMES DAILY AS NEEDED FOR DIARRHEA OR LOOSE STOOLS 120 tablet 1   metoprolol succinate (TOPROL-XL) 25 MG 24 hr tablet TAKE 1 TABLET BY MOUTH EVERY DAY. ANNUAL APPT DUE IN JULY MUST SEE PROVIDER FOR FUTURE REFILLS 90 tablet 3   metroNIDAZOLE (FLAGYL) 500 MG tablet Take 1 tablet (500 mg total) by mouth 3 (three) times daily. 30 tablet 1   ondansetron (ZOFRAN-ODT) 8 MG disintegrating tablet Take 1 tablet (8 mg total) by mouth every 8 (eight) hours as needed for nausea or vomiting. 10 tablet 0   sildenafil (VIAGRA) 100 MG tablet Take 1 tablet (100 mg total) by mouth daily as needed for erectile dysfunction. 12 tablet 5   spironolactone (ALDACTONE) 25 MG tablet Take 25 mg by mouth daily.     Vedolizumab (ENTYVIO IV) Inject into the vein.     venlafaxine XR (EFFEXOR XR) 150 MG 24 hr capsule Take 1 capsule (150 mg total) by mouth daily with breakfast. 90 capsule 1   zolpidem (AMBIEN) 10 MG tablet TAKE 1/2 TO 1 TABLET(5 TO 10 MG) BY MOUTH AT BEDTIME AS NEEDED FOR SLEEP 90 tablet 1   losartan (COZAAR) 25 MG tablet Take 1 tablet by mouth daily.      losartan-hydrochlorothiazide (HYZAAR) 100-25 MG tablet Take 1 tablet by mouth daily.     amLODipine (NORVASC) 5 MG tablet Take 5 mg by mouth daily. (Patient not taking: Reported on 03/11/2022)     colesevelam (WELCHOL) 625 MG tablet Take 2 tablets (1,250 mg total) by mouth 2 (two) times daily with a meal. 120 tablet 11   No facility-administered medications prior to visit.    ROS: Review of Systems  Constitutional:  Negative for appetite change, fatigue and unexpected weight change.  HENT:  Negative for congestion, nosebleeds, sneezing, sore throat and trouble swallowing.   Eyes:  Negative for itching and visual disturbance.  Respiratory:  Negative for cough.   Cardiovascular:  Negative for chest pain, palpitations and leg swelling.  Gastrointestinal:  Positive for diarrhea. Negative for abdominal distention, blood in stool and nausea.  Genitourinary:  Negative for frequency and hematuria.  Musculoskeletal:  Negative for back pain, gait problem, joint swelling and neck pain.  Skin:  Negative for rash and wound.  Neurological:  Negative for dizziness, tremors, speech difficulty and weakness.  Hematological:  Does not bruise/bleed easily.  Psychiatric/Behavioral:  Negative for agitation, dysphoric mood, sleep disturbance and suicidal ideas. The patient is not nervous/anxious.     Objective:  BP 128/72 (BP Location: Left Arm, Patient Position: Sitting, Cuff  Size: Normal)   Pulse 90   Temp 97.7 F (36.5 C) (Oral)   Ht 6' 3"  (1.905 m)   Wt (!) 301 lb (136.5 kg)   SpO2 98%   BMI 37.62 kg/m   BP Readings from Last 3 Encounters:  03/11/22 128/72  10/09/21 130/86  07/02/21 134/88    Wt Readings from Last 3 Encounters:  03/11/22 (!) 301 lb (136.5 kg)  10/09/21 (!) 304 lb (137.9 kg)  07/02/21 297 lb (134.7 kg)    Physical Exam Constitutional:      General: He is not in acute distress.    Appearance: He is well-developed. He is obese.     Comments: NAD  Eyes:      Conjunctiva/sclera: Conjunctivae normal.     Pupils: Pupils are equal, round, and reactive to light.  Neck:     Thyroid: No thyromegaly.     Vascular: No JVD.  Cardiovascular:     Rate and Rhythm: Normal rate and regular rhythm.     Heart sounds: Normal heart sounds. No murmur heard.    No friction rub. No gallop.  Pulmonary:     Effort: Pulmonary effort is normal. No respiratory distress.     Breath sounds: Normal breath sounds. No wheezing or rales.  Chest:     Chest wall: No tenderness.  Abdominal:     General: Bowel sounds are normal. There is no distension.     Palpations: Abdomen is soft. There is no mass.     Tenderness: There is no abdominal tenderness. There is no guarding or rebound.  Musculoskeletal:        General: No tenderness. Normal range of motion.     Cervical back: Normal range of motion.  Lymphadenopathy:     Cervical: No cervical adenopathy.  Skin:    General: Skin is warm and dry.     Findings: No rash.  Neurological:     Mental Status: He is alert and oriented to person, place, and time.     Cranial Nerves: No cranial nerve deficit.     Motor: No abnormal muscle tone.     Coordination: Coordination normal.     Gait: Gait normal.     Deep Tendon Reflexes: Reflexes are normal and symmetric.  Psychiatric:        Behavior: Behavior normal.        Thought Content: Thought content normal.        Judgment: Judgment normal.     Lab Results  Component Value Date   WBC 6.6 07/02/2021   HGB 13.5 07/02/2021   HCT 40.1 07/02/2021   PLT 414.0 (H) 07/02/2021   GLUCOSE 98 07/02/2021   CHOL 146 07/02/2021   TRIG 82.0 07/02/2021   HDL 35.60 (L) 07/02/2021   LDLDIRECT 196.0 12/08/2007   LDLCALC 94 07/02/2021   ALT 21 07/02/2021   AST 18 07/02/2021   NA 141 07/02/2021   K 4.5 07/02/2021   CL 102 07/02/2021   CREATININE 1.45 07/02/2021   BUN 19 07/02/2021   CO2 29 07/02/2021   TSH 2.38 07/02/2021   PSA 0.24 07/02/2021   INR 0.9 10/10/2010   HGBA1C 5.8  06/28/2020    DG Abd 1 View  Result Date: 06/18/2021 CLINICAL DATA:  Nephrolithiasis EXAM: ABDOMEN - 1 VIEW COMPARISON:  05/07/2021 FINDINGS: The bowel gas pattern is normal. Calcification projecting over the right renal shadow measuring 1.9 x 0.9 cm. No acute bony findings. IMPRESSION: 1.9 x 0.9 cm calcification projecting over the  right renal shadow. Electronically Signed   By: Davina Poke D.O.   On: 06/18/2021 08:01    Assessment & Plan:   Problem List Items Addressed This Visit     Crohn disease ([place])    Can I use Zepbound or Wegovy? - Check w/GI if he can Continue with Entyvio infusions, Lomotil as needed Hydrate well      Relevant Orders   Comprehensive metabolic panel   Lipid panel   Urinalysis   Decreased GFR - Primary    GFR 58-78 in the past 2-3 years Will ref to see Dr Posey Pronto Decreasing GFR, likely multifactorial (meds, periodic diarrhea due to Crohn's) - the pt is very worried. Reduce Hyzaar to 0.5 tab Monitor labs Hydrate well      Relevant Orders   Ambulatory referral to Nephrology   Comprehensive metabolic panel   Lipid panel   Urinalysis   US RENAL (Completed)   Obesity (BMI 35.0-39.9 without comorbidity)    Can I use Zepbound or Wegovy? - Check w/GI if he can Ask about coverage         Meds ordered this encounter  Medications   losartan-hydrochlorothiazide (HYZAAR) 100-25 MG tablet    Sig: Take 0.5 tablets by mouth daily.    Dispense:  90 tablet    Refill:  1      Follow-up: Return in about 6 weeks (around 04/22/2022) for a follow-up visit.  Walker Kehr, MD

## 2022-03-11 NOTE — Assessment & Plan Note (Addendum)
Can I use Zepbound or Wegovy? - Check w/GI if he can Ask about coverage

## 2022-03-11 NOTE — Assessment & Plan Note (Addendum)
Can I use Zepbound or Wegovy? - Check w/GI if he can Continue with Entyvio infusions, Lomotil as needed Hydrate well

## 2022-03-12 ENCOUNTER — Encounter: Payer: Self-pay | Admitting: Internal Medicine

## 2022-03-12 NOTE — Telephone Encounter (Signed)
Ozempic not on med list or have been rx.Marland KitchenJohny Kelly

## 2022-03-13 ENCOUNTER — Encounter: Payer: Self-pay | Admitting: Internal Medicine

## 2022-03-19 ENCOUNTER — Other Ambulatory Visit: Payer: Self-pay | Admitting: Internal Medicine

## 2022-03-19 MED ORDER — WEGOVY 0.25 MG/0.5ML ~~LOC~~ SOAJ: 0.2500 mg | SUBCUTANEOUS | 2 refills | Status: DC

## 2022-03-28 ENCOUNTER — Ambulatory Visit
Admission: RE | Admit: 2022-03-28 | Discharge: 2022-03-28 | Disposition: A | Discharge status: Home / Self Care | Payer: Managed Care, Other (non HMO) | Source: Ambulatory Visit | Attending: Internal Medicine | Admitting: Internal Medicine

## 2022-03-28 DIAGNOSIS — R944 Abnormal results of kidney function studies: Secondary | ICD-10-CM

## 2022-03-29 ENCOUNTER — Telehealth: Payer: Self-pay | Admitting: Internal Medicine

## 2022-03-29 MED ORDER — WEGOVY 0.25 MG/0.5ML ~~LOC~~ SOAJ: 0.2500 mg | SUBCUTANEOUS | 2 refills | Status: DC

## 2022-03-29 NOTE — Telephone Encounter (Signed)
Resent rx to pof.Marland KitchenJohny Chess

## 2022-03-29 NOTE — Telephone Encounter (Signed)
Patient is trying to pick his rx of Wegovy up at the pharmacy - they have not received the rx. - Please send to [facility] on [address], [address]

## 2022-04-01 NOTE — Telephone Encounter (Signed)
Submitted PA w/  (Key: [id number]) PA sent to [facility]../l,mb

## 2022-04-01 NOTE — Telephone Encounter (Signed)
Rec'd determination ned was Approved. Coverage Start Date:03/02/2022; Coverage End Date:10/28/2022. Notified pof w/ approval../lmb

## 2022-04-01 NOTE — Telephone Encounter (Signed)
Pt was unable to pick up [place] because it was not covered by his INS Christella Scheuermann)   [employer] told the pt that we have not filled out the PA, but no PA has come through the fax queue so far.   If possible please start a PA for this medication.

## 2022-04-16 ENCOUNTER — Telehealth: Payer: Self-pay | Admitting: *Deleted

## 2022-04-16 NOTE — Telephone Encounter (Signed)
MD received fax referral back from [facility] Dr. Harrie Jeans stating that the patient does not need a Nephrology. Pass labs was good. MD wanted to inform pt of her statement. Pt states he don't understand why she said that when his kidney functions at one point was bad. Inform pt we can only give him the office number there and he can call to see why they refuse referral. Gave him [phone] since referral was placed..Princella Pellegrini

## 2022-04-17 NOTE — Telephone Encounter (Signed)
I told the patient myself that his kidney function was good.  Kidney function may deteriorate temporarily John show abnormal results when we are dehydrated from diarrhea, nausea and vomiting, etc. Thanks

## 2022-04-18 NOTE — Telephone Encounter (Signed)
Patient called asking if he can have a referral sent elsewhere for this so he can be seen

## 2022-04-18 NOTE — Telephone Encounter (Signed)
Nephrology doctors will not see him for normal kidneys.  He can find a nephrologist that will see him; then I can make a referral retrospectively.  I am sorry. Thanks

## 2022-05-07 ENCOUNTER — Telehealth: Payer: Self-pay | Admitting: Internal Medicine

## 2022-05-07 NOTE — Telephone Encounter (Signed)
Patient is requesting to be switched from [facility] to [facility]. Requests that be sent to [id number] - [place], [address] AT [address]   Requests call back at [phone]

## 2022-05-07 NOTE — Telephone Encounter (Signed)
Patient called regarding referral. I relayed message from Dr. Alain Marion - patient will work on finding a nephrology office that will see him and will call back with that information.

## 2022-05-08 MED ORDER — OZEMPIC (0.25 OR 0.5 MG/DOSE) 2 MG/3ML ~~LOC~~ SOPN: 0.2500 mg | PEN_INJECTOR | SUBCUTANEOUS | 2 refills | Status: DC

## 2022-05-08 NOTE — Telephone Encounter (Signed)
Notified pt rx sent to [facility].Marland KitchenJohny Chess

## 2022-05-08 NOTE — Telephone Encounter (Signed)
Ozempic is covered for diabetics only.  Thanks

## 2022-05-22 ENCOUNTER — Telehealth: Payer: Self-pay | Admitting: Internal Medicine

## 2022-05-22 NOTE — Telephone Encounter (Signed)
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 He is wondering if he should get tested for HIV since he got the person's blood on him.  Please advise.  Patient's number:  [phone]

## 2022-05-22 NOTE — Telephone Encounter (Signed)
Called pt inform him MD did not have anything today. He didn't want to see Vickie tomorrow mor ing at 11:00. He states he will go to urgent care instead and will f/u w/ Plot if need too. Inform him he recently had his Tdap 2020.Marland KitchenJohny Kelly

## 2022-05-23 ENCOUNTER — Telehealth: Payer: Self-pay

## 2022-05-23 ENCOUNTER — Other Ambulatory Visit (HOSPITAL_COMMUNITY): Payer: Self-pay

## 2022-05-23 NOTE — Telephone Encounter (Signed)
Pharmacy Patient Advocate Encounter   Received notification from [facility] that prior authorization for Ozempic '2mg'$ /63m is required/requested.  Per Test Claim: Prior auth required   PA submitted on 05/23/22 to (ins) [employer] [facility]eds [facility]

## 2022-05-28 NOTE — Telephone Encounter (Signed)
Check on pending PA receive msg " Please wait for [employer] 2017 to return a determination"...Casey Kelly

## 2022-05-30 NOTE — Telephone Encounter (Signed)
Received a fax regarding Prior Authorization from [place] for [place].   Authorization has been DENIED because coverage is provided for the diagnosis diabetes mellitus type 2.  Phone# [phone]

## 2022-06-03 MED ORDER — WEGOVY 0.25 MG/0.5ML ~~LOC~~ SOAJ: 0.2500 mg | SUBCUTANEOUS | 2 refills | Status: DC

## 2022-06-03 NOTE — Addendum Note (Signed)
Addended by: Cassandria Anger on: 06/03/2022 11:33 PM   Modules accepted: Orders

## 2022-06-03 NOTE — Telephone Encounter (Signed)
I emailed [id number] prescription.  I do know if it is covered.  Thanks

## 2022-06-12 ENCOUNTER — Telehealth: Payer: Self-pay | Admitting: Internal Medicine

## 2022-06-12 NOTE — Telephone Encounter (Signed)
You will have to rx zepbound.Marland KitchenJohny Kelly

## 2022-06-12 NOTE — Telephone Encounter (Signed)
Patient called and stated that he has been out of his [place] for two months. Patient stated that he his insurance will cover the medication Zepbound. It will just need a Prior Authorization from his insurance. Patient is requesting this medication Zepbound to be called to his Pharmacy on file. Best callback number is [phone].

## 2022-06-12 NOTE — Telephone Encounter (Signed)
Duplicate msg.. see previous msg../lmb 

## 2022-06-16 MED ORDER — ZEPBOUND 2.5 MG/0.5ML ~~LOC~~ SOAJ: 2.5000 mg | SUBCUTANEOUS | 3 refills | Status: DC

## 2022-06-16 NOTE — Addendum Note (Signed)
Addended by: Cassandria Anger on: 06/16/2022 11:26 PM   Modules accepted: Orders

## 2022-06-16 NOTE — Telephone Encounter (Signed)
Okay.  Thanks.

## 2022-06-25 ENCOUNTER — Telehealth: Payer: Self-pay

## 2022-06-25 ENCOUNTER — Other Ambulatory Visit: Payer: Self-pay | Admitting: Internal Medicine

## 2022-06-25 NOTE — Telephone Encounter (Signed)
Pts insurance called stating pt is needing a PA sent in for the medication Zepbound 2.'5MG'$ .

## 2022-07-01 ENCOUNTER — Other Ambulatory Visit (HOSPITAL_COMMUNITY): Payer: Self-pay

## 2022-07-04 NOTE — Telephone Encounter (Signed)
Patient called wanting an update on the PA for his Zepbound. Patient would like a callback at [phone].

## 2022-07-04 NOTE — Telephone Encounter (Signed)
Went to cover-my-meds did not see PA for pt. Submitted PA w/ (Key: [id number]) Your information has been sent to [employer]. Notified pt w/ status.Marland KitchenJohny Chess

## 2022-07-08 NOTE — Telephone Encounter (Signed)
Check status on PA which is still pending...Johny Chess

## 2022-07-09 NOTE — Telephone Encounter (Signed)
Check status on PA which is still pending.../l;mb

## 2022-07-09 NOTE — Telephone Encounter (Signed)
Rec'd determination med was APPROVED. [id number]; Status:Approved; Coverage Start 06/04/2022 , End 03/06/2023. Inform pt of status...Casey Kelly

## 2022-07-17 NOTE — Telephone Encounter (Signed)
[employer] called and said there was a limit for the 2.5mg  dosage and the patient has met that limit. They said if the dosage is increased, another prior authorization will not be needed. If PCP would like for patient to stay on same dosage, another prior authorization will be needed Patient would like a call back with either decision at [phone].

## 2022-07-20 NOTE — Telephone Encounter (Signed)
The patient needs a follow-up visit.  Thank you

## 2022-07-22 NOTE — Telephone Encounter (Signed)
Notified pt w/MD response. Made appt for 07/24/22 @ 3:00...Casey Kelly

## 2022-07-23 DIAGNOSIS — A630 Anogenital (venereal) warts: Secondary | ICD-10-CM | POA: Insufficient documentation

## 2022-07-24 ENCOUNTER — Ambulatory Visit: Payer: Managed Care, Other (non HMO) | Admitting: Internal Medicine

## 2022-07-24 ENCOUNTER — Encounter: Payer: Self-pay | Admitting: Internal Medicine

## 2022-07-24 VITALS — BP 118/78 | HR 82 | Temp 98.6°F | Ht 75.0 in | Wt 289.0 lb

## 2022-07-24 DIAGNOSIS — E669 Obesity, unspecified: Secondary | ICD-10-CM

## 2022-07-24 DIAGNOSIS — N529 Male erectile dysfunction, unspecified: Secondary | ICD-10-CM | POA: Diagnosis not present

## 2022-07-24 DIAGNOSIS — R739 Hyperglycemia, unspecified: Secondary | ICD-10-CM | POA: Diagnosis not present

## 2022-07-24 DIAGNOSIS — K5080 Crohn's disease of both small and large intestine without complications: Secondary | ICD-10-CM

## 2022-07-24 MED ORDER — TIRZEPATIDE 5 MG/0.5ML ~~LOC~~ SOAJ: 5.0000 mg | SUBCUTANEOUS | 3 refills | Status: DC

## 2022-07-24 MED ORDER — ZEPBOUND 5 MG/0.5ML ~~LOC~~ SOAJ: 5.0000 mg | SUBCUTANEOUS | 3 refills | Status: DC

## 2022-07-24 NOTE — Assessment & Plan Note (Signed)
Pt took ZepBound x 1 mo - lost wt 10 lbs Cont w/same dose for now

## 2022-07-24 NOTE — Assessment & Plan Note (Signed)
Cont w/wt loss 

## 2022-07-24 NOTE — Assessment & Plan Note (Signed)
OK to d/c Spironolactone - BP is nl

## 2022-07-24 NOTE — Progress Notes (Signed)
Subjective:  Patient ID: Casey Kelly, male    DOB: [birth year]  Age: 47 y.o. MRN: [id number]  CC: Medication Problem (Zepbound)   HPI Casey Kelly presents for obesity Pt took ZepBound x 1 mo - lost wt 10 lbs Insurance will approve 5 mg/week F/u on HTN, ED   Outpatient Medications Prior to Visit  Medication Sig Dispense Refill   atorvastatin (LIPITOR) 20 MG tablet Take 20 mg by mouth at bedtime. Take 1 by mouth at bedtime     Cholecalciferol (VITAMIN D3) 50 MCG (2000 UT) capsule Take 1 capsule (2,000 Units total) by mouth daily. 100 capsule 3   dicyclomine (BENTYL) 10 MG capsule Take 10 mg by mouth 4 (four) times daily -  before meals and at bedtime.     diphenoxylate-atropine (LOMOTIL) 2.5-0.025 MG tablet Take 1-2 tablets by mouth 4 (four) times daily as needed for diarrhea or loose stools. TAKE 1 TABLET BY MOUTH FOUR TIMES DAILY AS NEEDED FOR DIARRHEA OR LOOSE STOOLS 120 tablet 1   losartan-hydrochlorothiazide (HYZAAR) 100-25 MG tablet Take 0.5 tablets by mouth daily. 90 tablet 1   metoprolol succinate (TOPROL-XL) 25 MG 24 hr tablet TAKE 1 TABLET BY MOUTH EVERY DAY. ANNUAL APPT DUE IN JULY MUST SEE PROVIDER FOR FUTURE REFILLS 90 tablet 3   metroNIDAZOLE (FLAGYL) 500 MG tablet Take 1 tablet (500 mg total) by mouth 3 (three) times daily. 30 tablet 1   ondansetron (ZOFRAN-ODT) 8 MG disintegrating tablet Take 1 tablet (8 mg total) by mouth every 8 (eight) hours as needed for nausea or vomiting. 10 tablet 0   sildenafil (VIAGRA) 100 MG tablet Take 1 tablet (100 mg total) by mouth daily as needed for erectile dysfunction. 12 tablet 5   spironolactone (ALDACTONE) 25 MG tablet Take 25 mg by mouth daily.     venlafaxine XR (EFFEXOR XR) 150 MG 24 hr capsule Take 1 capsule (150 mg total) by mouth daily with breakfast. 90 capsule 1   zolpidem (AMBIEN) 10 MG tablet TAKE 1/2 TO 1 TABLET(5 TO 10 MG) BY MOUTH AT BEDTIME AS NEEDED FOR SLEEP 90 tablet 1   Semaglutide-Weight Management (WEGOVY) 0.25  MG/0.5ML SOAJ Inject 0.25 mg into the skin once a week. 2 mL 2   tirzepatide (ZEPBOUND) 2.5 MG/0.5ML Pen Inject 2.5 mg into the skin once a week. 2 mL 3   amLODipine (NORVASC) 5 MG tablet Take 5 mg by mouth daily. (Patient not taking: Reported on 03/11/2022)     colesevelam (WELCHOL) 625 MG tablet Take 2 tablets (1,250 mg total) by mouth 2 (two) times daily with a meal. 120 tablet 11   Vedolizumab (ENTYVIO IV) Inject into the vein. (Patient not taking: Reported on 07/24/2022)     No facility-administered medications prior to visit.    ROS: Review of Systems  Constitutional:  Negative for appetite change, fatigue and unexpected weight change.  HENT:  Negative for congestion, nosebleeds, sneezing, sore throat and trouble swallowing.   Eyes:  Negative for itching and visual disturbance.  Respiratory:  Negative for cough.   Cardiovascular:  Negative for chest pain, palpitations and leg swelling.  Gastrointestinal:  Negative for abdominal distention, blood in stool, diarrhea and nausea.  Genitourinary:  Negative for frequency and hematuria.  Musculoskeletal:  Negative for back pain, gait problem, joint swelling and neck pain.  Skin:  Negative for rash.  Neurological:  Negative for dizziness, tremors, speech difficulty and weakness.  Psychiatric/Behavioral:  Negative for agitation, dysphoric mood and sleep disturbance. The patient  is not nervous/anxious.     Objective:  BP 118/78 (BP Location: Left Arm, Patient Position: Sitting, Cuff Size: Large)   Pulse 82   Temp 98.6 F (37 C) (Oral)   Ht 6\' 3"  (1.905 m)   Wt 289 lb (131.1 kg)   SpO2 98%   BMI 36.12 kg/m   BP Readings from Last 3 Encounters:  07/24/22 118/78  03/11/22 128/72  10/09/21 130/86    Wt Readings from Last 3 Encounters:  07/24/22 289 lb (131.1 kg)  03/11/22 (!) 301 lb (136.5 kg)  10/09/21 (!) 304 lb (137.9 kg)    Physical Exam Constitutional:      General: He is not in acute distress.    Appearance: He is  well-developed.     Comments: NAD  Eyes:     Conjunctiva/sclera: Conjunctivae normal.     Pupils: Pupils are equal, round, and reactive to light.  Neck:     Thyroid: No thyromegaly.     Vascular: No JVD.  Cardiovascular:     Rate and Rhythm: Normal rate and regular rhythm.     Heart sounds: Normal heart sounds. No murmur heard.    No friction rub. No gallop.  Pulmonary:     Effort: Pulmonary effort is normal. No respiratory distress.     Breath sounds: Normal breath sounds. No wheezing or rales.  Chest:     Chest wall: No tenderness.  Abdominal:     General: Bowel sounds are normal. There is no distension.     Palpations: Abdomen is soft. There is no mass.     Tenderness: There is no abdominal tenderness. There is no guarding or rebound.  Musculoskeletal:        General: No tenderness. Normal range of motion.     Cervical back: Normal range of motion.  Lymphadenopathy:     Cervical: No cervical adenopathy.  Skin:    General: Skin is warm and dry.     Findings: No rash.  Neurological:     Mental Status: He is alert and oriented to person, place, and time.     Cranial Nerves: No cranial nerve deficit.     Motor: No abnormal muscle tone.     Coordination: Coordination normal.     Gait: Gait normal.     Deep Tendon Reflexes: Reflexes are normal and symmetric.  Psychiatric:        Behavior: Behavior normal.        Thought Content: Thought content normal.        Judgment: Judgment normal.     Lab Results  Component Value Date   WBC 6.6 07/02/2021   HGB 13.5 07/02/2021   HCT 40.1 07/02/2021   PLT 414.0 (H) 07/02/2021   GLUCOSE 98 07/02/2021   CHOL 146 07/02/2021   TRIG 82.0 07/02/2021   HDL 35.60 (L) 07/02/2021   LDLDIRECT 196.0 12/08/2007   LDLCALC 94 07/02/2021   ALT 21 07/02/2021   AST 18 07/02/2021   NA 141 07/02/2021   K 4.5 07/02/2021   CL 102 07/02/2021   CREATININE 1.45 07/02/2021   BUN 19 07/02/2021   CO2 29 07/02/2021   TSH 2.38 07/02/2021   PSA 0.24  07/02/2021   INR 0.9 10/10/2010   HGBA1C 5.8 06/28/2020    US RENAL  Result Date: 03/28/2022 CLINICAL DATA:  Abnormal GFR EXAM: RENAL / URINARY TRACT ULTRASOUND COMPLETE COMPARISON:  None Available. FINDINGS: Right Kidney: Renal measurements: 11.9 x 4.8 x 5.6 cm = volume: 164 mL.  Echogenicity within normal limits. No mass or hydronephrosis visualized. Left Kidney: Renal measurements: 12.7 x 7.7 x 6.4 cm = volume: 338 mL. Echogenicity within normal limits. No mass or hydronephrosis visualized. Bladder: Appears normal for degree of bladder distention. Other: None. IMPRESSION: Normal study. No cause for the patient's symptoms identified. Electronically Signed   By: Gerome Sam III M.D.   On: 03/28/2022 12:24    Assessment & Plan:   Problem List Items Addressed This Visit       Other   Crohn disease    Careful w/Zepbound - discussed      Erectile dysfunction    OK to d/c Spironolactone - BP is nl      Hyperglycemia    Cont w/wt loss      Obesity (BMI 35.0-39.9 without comorbidity) - Primary    Pt took ZepBound x 1 mo - lost wt 10 lbs Cont w/same dose for now      Relevant Medications   tirzepatide (ZEPBOUND) 5 MG/0.5ML Pen      Meds ordered this encounter  Medications   DISCONTD: tirzepatide (MOUNJARO) 5 MG/0.5ML Pen    Sig: Inject 5 mg into the skin once a week.    Dispense:  6 mL    Refill:  3   tirzepatide (ZEPBOUND) 5 MG/0.5ML Pen    Sig: Inject 5 mg into the skin once a week.    Dispense:  2 mL    Refill:  3      Follow-up: Return in about 2 months (around 09/23/2022) for a follow-up visit.  Sonda Primes, MD

## 2022-07-24 NOTE — Assessment & Plan Note (Signed)
Careful w/Zepbound - discussed

## 2022-08-21 DIAGNOSIS — K50111 Crohn's disease of large intestine with rectal bleeding: Secondary | ICD-10-CM | POA: Diagnosis not present

## 2022-09-02 ENCOUNTER — Telehealth: Payer: Self-pay | Admitting: *Deleted

## 2022-09-02 NOTE — Telephone Encounter (Signed)
Rec'd fax for drug change request. Have rx for Stendra 100 mg med is not covered by plan. The preferred alternative are Sildenafil Citrate, Tadalafil, and Vardenafil HCL.Marland KitchenRaechel Chute

## 2022-09-03 MED ORDER — SILDENAFIL CITRATE 100 MG PO TABS: 100.0000 mg | ORAL_TABLET | Freq: Every day | ORAL | 5 refills | Status: DC | PRN

## 2022-09-03 NOTE — Telephone Encounter (Signed)
Sildenafil was renewed.  Thanks

## 2022-10-03 ENCOUNTER — Telehealth: Payer: Self-pay | Admitting: Internal Medicine

## 2022-10-03 NOTE — Telephone Encounter (Signed)
Pt called stating pharmacy need PA for tirzepatide (ZEPBOUND) 5 MG/0.5ML Pen

## 2022-10-03 NOTE — Telephone Encounter (Signed)
Forwarding to Rx Prior Auth...Raechel Chute

## 2022-10-04 ENCOUNTER — Other Ambulatory Visit (HOSPITAL_COMMUNITY): Payer: Self-pay

## 2022-10-04 ENCOUNTER — Telehealth: Payer: Self-pay

## 2022-10-04 NOTE — Telephone Encounter (Signed)
PA being submitted, see additional encounter

## 2022-10-04 NOTE — Telephone Encounter (Signed)
Pharmacy Patient Advocate Encounter   Received notification that prior authorization for Zepbound 5mg /0.28ml is required/requested.   PA submitted to [facility] via CoverMyMeds Key  # [id number] Status is awaiting clinical questions.

## 2022-10-07 NOTE — Telephone Encounter (Signed)
Pharmacy Patient Advocate Encounter   Received notification that prior authorization for Zepbound 5mg /0.52ml is required/requested.   PA submitted to [facility] via CoverMyMeds Key # [id number] Status is pending

## 2022-10-08 NOTE — Telephone Encounter (Signed)
Pharmacy Patient Advocate Encounter  Received notification from [facility] that the request for prior authorization for Zepbound has been denied due to this health benefit plan does not cover the following services, supplies, drugs or charges: any treatment or regimen, medical or surgical, for the purpose of reducing or controlling the weight of the member, or for the treatment of obesity, except for surgical treatment of morbid obesity or as specifically covered by this health benefit plan.    Denial letter indexed to chart.

## 2022-10-22 DIAGNOSIS — N200 Calculus of kidney: Secondary | ICD-10-CM | POA: Diagnosis not present

## 2022-11-27 ENCOUNTER — Other Ambulatory Visit: Payer: Self-pay | Admitting: Internal Medicine

## 2022-12-03 ENCOUNTER — Ambulatory Visit: Payer: BC Managed Care – PPO | Admitting: Internal Medicine

## 2022-12-03 ENCOUNTER — Encounter: Payer: Self-pay | Admitting: Internal Medicine

## 2022-12-03 VITALS — BP 130/80 | HR 82 | Temp 98.6°F | Ht 75.0 in | Wt 292.0 lb

## 2022-12-03 DIAGNOSIS — M545 Low back pain, unspecified: Secondary | ICD-10-CM

## 2022-12-03 DIAGNOSIS — E785 Hyperlipidemia, unspecified: Secondary | ICD-10-CM | POA: Diagnosis not present

## 2022-12-03 MED ORDER — METHOCARBAMOL 750 MG PO TABS: 750.0000 mg | ORAL_TABLET | Freq: Four times a day (QID) | ORAL | 1 refills | Status: DC | PRN

## 2022-12-03 MED ORDER — HYDROCODONE-ACETAMINOPHEN 10-325 MG PO TABS: 1.0000 | ORAL_TABLET | Freq: Four times a day (QID) | ORAL | 0 refills | Status: DC | PRN

## 2022-12-03 MED ORDER — METHYLPREDNISOLONE 4 MG PO TBPK: ORAL_TABLET | ORAL | 0 refills | Status: DC

## 2022-12-03 NOTE — Assessment & Plan Note (Signed)
MSK - severe Norco prn Robaxin prn Medrol pack Stretching and heat/ice

## 2022-12-03 NOTE — Progress Notes (Signed)
Subjective:  Patient ID: Casey Kelly, male    DOB: 11-Aug-1975  Age: 47 y.o. MRN: [id number]  CC: Back Pain (Back pain x4 days)   HPI LERON LOSIER presents for severe LBP since Fri. He painted 5 rooms prior. C/o spasms. Pain is worse with moving, walking. Sitting helps... Pt took Advil and Flexeril - it did not help.  Outpatient Medications Prior to Visit  Medication Sig Dispense Refill   atorvastatin (LIPITOR) 20 MG tablet Take 20 mg by mouth at bedtime. Take 1 by mouth at bedtime     Cholecalciferol (VITAMIN D3) 50 MCG (2000 UT) capsule Take 1 capsule (2,000 Units total) by mouth daily. 100 capsule 3   dicyclomine (BENTYL) 10 MG capsule Take 10 mg by mouth 4 (four) times daily -  before meals and at bedtime.     diphenoxylate-atropine (LOMOTIL) 2.5-0.025 MG tablet Take 1-2 tablets by mouth 4 (four) times daily as needed for diarrhea or loose stools. TAKE 1 TABLET BY MOUTH FOUR TIMES DAILY AS NEEDED FOR DIARRHEA OR LOOSE STOOLS 120 tablet 1   losartan-hydrochlorothiazide (HYZAAR) 100-25 MG tablet Take 0.5 tablets by mouth daily. 90 tablet 1   metoprolol succinate (TOPROL-XL) 25 MG 24 hr tablet TAKE 1 TABLET BY MOUTH EVERY DAY. ANNUAL APPT DUE IN JULY MUST SEE PROVIDER FOR FUTURE REFILLS 90 tablet 3   metroNIDAZOLE (FLAGYL) 500 MG tablet Take 1 tablet (500 mg total) by mouth 3 (three) times daily. 30 tablet 1   ondansetron (ZOFRAN-ODT) 8 MG disintegrating tablet Take 1 tablet (8 mg total) by mouth every 8 (eight) hours as needed for nausea or vomiting. 10 tablet 0   sildenafil (VIAGRA) 100 MG tablet Take 1 tablet (100 mg total) by mouth daily as needed for erectile dysfunction. 12 tablet 5   spironolactone (ALDACTONE) 25 MG tablet Take 25 mg by mouth daily.     tirzepatide (ZEPBOUND) 5 MG/0.5ML Pen Inject 5 mg into the skin once a week. 2 mL 3   venlafaxine XR (EFFEXOR XR) 150 MG 24 hr capsule Take 1 capsule (150 mg total) by mouth daily with breakfast. 90 capsule 1   zolpidem (AMBIEN)  10 MG tablet TAKE 1/2 TO 1 TABLET(5 TO 10 MG) BY MOUTH AT BEDTIME AS NEEDED FOR SLEEP 90 tablet 1   amLODipine (NORVASC) 5 MG tablet Take 5 mg by mouth daily. (Patient not taking: Reported on 03/11/2022)     colesevelam (WELCHOL) 625 MG tablet Take 2 tablets (1,250 mg total) by mouth 2 (two) times daily with a meal. 120 tablet 11   Vedolizumab (ENTYVIO IV) Inject into the vein. (Patient not taking: Reported on 07/24/2022)     No facility-administered medications prior to visit.    ROS: Review of Systems  Constitutional:  Negative for appetite change, fatigue and unexpected weight change.  HENT:  Negative for congestion, nosebleeds, sneezing, sore throat and trouble swallowing.   Eyes:  Negative for itching and visual disturbance.  Respiratory:  Negative for cough.   Cardiovascular:  Negative for chest pain, palpitations and leg swelling.  Gastrointestinal:  Negative for abdominal distention, blood in stool, diarrhea and nausea.  Genitourinary:  Negative for frequency and hematuria.  Musculoskeletal:  Positive for back pain and gait problem. Negative for joint swelling and neck pain.  Skin:  Negative for rash.  Neurological:  Negative for dizziness, tremors, speech difficulty and weakness.  Psychiatric/Behavioral:  Positive for sleep disturbance. Negative for agitation and dysphoric mood. The patient is not nervous/anxious.  Objective:  BP 130/80 (BP Location: Right Arm, Patient Position: Sitting, Cuff Size: Large)   Pulse 82   Temp 98.6 F (37 C) (Oral)   Ht 6\' 3"  (1.905 m)   Wt 292 lb (132.5 kg)   SpO2 98%   BMI 36.50 kg/m   BP Readings from Last 3 Encounters:  12/03/22 130/80  07/24/22 118/78  03/11/22 128/72    Wt Readings from Last 3 Encounters:  12/03/22 292 lb (132.5 kg)  07/24/22 289 lb (131.1 kg)  03/11/22 (!) 301 lb (136.5 kg)    Physical Exam Constitutional:      General: He is not in acute distress.    Appearance: He is well-developed. He is obese.      Comments: NAD  Eyes:     Conjunctiva/sclera: Conjunctivae normal.     Pupils: Pupils are equal, round, and reactive to light.  Neck:     Thyroid: No thyromegaly.     Vascular: No JVD.  Cardiovascular:     Rate and Rhythm: Normal rate and regular rhythm.     Heart sounds: Normal heart sounds. No murmur heard.    No friction rub. No gallop.  Pulmonary:     Effort: Pulmonary effort is normal. No respiratory distress.     Breath sounds: Normal breath sounds. No wheezing or rales.  Chest:     Chest wall: No tenderness.  Abdominal:     General: Bowel sounds are normal. There is no distension.     Palpations: Abdomen is soft. There is no mass.     Tenderness: There is no abdominal tenderness. There is no guarding or rebound.  Musculoskeletal:        General: Tenderness present. Normal range of motion.     Cervical back: Normal range of motion.  Lymphadenopathy:     Cervical: No cervical adenopathy.  Skin:    General: Skin is warm and dry.     Findings: No rash.  Neurological:     Mental Status: He is alert and oriented to person, place, and time.     Cranial Nerves: No cranial nerve deficit.     Motor: No abnormal muscle tone.     Coordination: Coordination normal.     Gait: Gait normal.     Deep Tendon Reflexes: Reflexes are normal and symmetric.  Psychiatric:        Behavior: Behavior normal.        Thought Content: Thought content normal.        Judgment: Judgment normal.   Standing is better Very painful back w/any ROM Str leg elev (-) B  Lab Results  Component Value Date   WBC 6.6 07/02/2021   HGB 13.5 07/02/2021   HCT 40.1 07/02/2021   PLT 414.0 (H) 07/02/2021   GLUCOSE 98 07/02/2021   CHOL 146 07/02/2021   TRIG 82.0 07/02/2021   HDL 35.60 (L) 07/02/2021   LDLDIRECT 196.0 12/08/2007   LDLCALC 94 07/02/2021   ALT 21 07/02/2021   AST 18 07/02/2021   NA 141 07/02/2021   K 4.5 07/02/2021   CL 102 07/02/2021   CREATININE 1.45 07/02/2021   BUN 19 07/02/2021    CO2 29 07/02/2021   TSH 2.38 07/02/2021   PSA 0.24 07/02/2021   INR 0.9 10/10/2010   HGBA1C 5.8 06/28/2020    US RENAL  Result Date: 03/28/2022 CLINICAL DATA:  Abnormal GFR EXAM: RENAL / URINARY TRACT ULTRASOUND COMPLETE COMPARISON:  None Available. FINDINGS: Right Kidney: Renal measurements: 11.9 x 4.8  x 5.6 cm = volume: 164 mL. Echogenicity within normal limits. No mass or hydronephrosis visualized. Left Kidney: Renal measurements: 12.7 x 7.7 x 6.4 cm = volume: 338 mL. Echogenicity within normal limits. No mass or hydronephrosis visualized. Bladder: Appears normal for degree of bladder distention. Other: None. IMPRESSION: Normal study. No cause for the patient's symptoms identified. Electronically Signed   By: Gerome Sam III M.D.   On: 03/28/2022 12:24    Assessment & Plan:   Problem List Items Addressed This Visit     Low back pain - Primary    MSK - severe Norco prn Robaxin prn Medrol pack Stretching and heat/ice      Relevant Medications   HYDROcodone-acetaminophen (NORCO) 10-325 MG tablet   methocarbamol (ROBAXIN-750) 750 MG tablet   methylPREDNISolone (MEDROL DOSEPAK) 4 MG TBPK tablet   Dyslipidemia    Hold Lipitor x 1 week         Meds ordered this encounter  Medications   HYDROcodone-acetaminophen (NORCO) 10-325 MG tablet    Sig: Take 1 tablet by mouth every 6 (six) hours as needed.    Dispense:  20 tablet    Refill:  0   methocarbamol (ROBAXIN-750) 750 MG tablet    Sig: Take 1 tablet (750 mg total) by mouth every 6 (six) hours as needed for muscle spasms.    Dispense:  40 tablet    Refill:  1   methylPREDNISolone (MEDROL DOSEPAK) 4 MG TBPK tablet    Sig: As directed    Dispense:  21 tablet    Refill:  0      Follow-up: Return in about 2 weeks (around 12/17/2022) for a follow-up visit.  Sonda Primes, MD

## 2022-12-03 NOTE — Patient Instructions (Signed)
  Medrol pack if worse Stretching and heat/ice

## 2022-12-03 NOTE — Assessment & Plan Note (Signed)
Hold Lipitor x 1 week

## 2022-12-31 ENCOUNTER — Other Ambulatory Visit: Payer: Self-pay | Admitting: Internal Medicine

## 2023-01-04 DIAGNOSIS — J018 Other acute sinusitis: Secondary | ICD-10-CM | POA: Diagnosis not present

## 2023-01-15 ENCOUNTER — Ambulatory Visit: Payer: BC Managed Care – PPO | Admitting: Internal Medicine

## 2023-01-22 DIAGNOSIS — K50818 Crohn's disease of both small and large intestine with other complication: Secondary | ICD-10-CM | POA: Diagnosis not present

## 2023-02-03 ENCOUNTER — Ambulatory Visit: Payer: BC Managed Care – PPO | Admitting: Internal Medicine

## 2023-02-03 ENCOUNTER — Ambulatory Visit (INDEPENDENT_AMBULATORY_CARE_PROVIDER_SITE_OTHER): Payer: BC Managed Care – PPO

## 2023-02-03 ENCOUNTER — Encounter: Payer: Self-pay | Admitting: Internal Medicine

## 2023-02-03 VITALS — BP 124/90 | HR 75 | Temp 98.9°F | Ht 75.0 in | Wt 288.2 lb

## 2023-02-03 DIAGNOSIS — R052 Subacute cough: Secondary | ICD-10-CM

## 2023-02-03 DIAGNOSIS — E669 Obesity, unspecified: Secondary | ICD-10-CM | POA: Diagnosis not present

## 2023-02-03 DIAGNOSIS — Z6836 Body mass index (BMI) 36.0-36.9, adult: Secondary | ICD-10-CM

## 2023-02-03 DIAGNOSIS — R059 Cough, unspecified: Secondary | ICD-10-CM | POA: Diagnosis not present

## 2023-02-03 DIAGNOSIS — K50111 Crohn's disease of large intestine with rectal bleeding: Secondary | ICD-10-CM

## 2023-02-03 DIAGNOSIS — E559 Vitamin D deficiency, unspecified: Secondary | ICD-10-CM

## 2023-02-03 DIAGNOSIS — E291 Testicular hypofunction: Secondary | ICD-10-CM | POA: Diagnosis not present

## 2023-02-03 MED ORDER — AZITHROMYCIN 250 MG PO TABS: ORAL_TABLET | ORAL | 0 refills | Status: DC

## 2023-02-03 NOTE — Assessment & Plan Note (Signed)
On Entevyo

## 2023-02-03 NOTE — Assessment & Plan Note (Signed)
Goes to [facility] for testosterone, Phentermine, labs Wt Readings from Last 3 Encounters:  02/03/23 288 lb 3.2 oz (130.7 kg)  12/03/22 292 lb (132.5 kg)  07/24/22 289 lb (131.1 kg)

## 2023-02-03 NOTE — Progress Notes (Signed)
Subjective:  Patient ID: Casey Kelly, male    DOB: [birth year]  Age: 47 y.o. MRN: [id number]  CC: Cough (1 month. Patient states that its getting better )   HPI Casey Kelly presents for LBP f/u - much better C/o cough after a URI - yellow x 1 month Goes to [facility] for testosterone, Phentermine, labs  Outpatient Medications Prior to Visit  Medication Sig Dispense Refill   Cholecalciferol (VITAMIN D3) 50 MCG (2000 UT) capsule Take 1 capsule (2,000 Units total) by mouth daily. 100 capsule 3   diphenoxylate-atropine (LOMOTIL) 2.5-0.025 MG tablet Take 1-2 tablets by mouth 4 (four) times daily as needed for diarrhea or loose stools. TAKE 1 TABLET BY MOUTH FOUR TIMES DAILY AS NEEDED FOR DIARRHEA OR LOOSE STOOLS 120 tablet 1   losartan-hydrochlorothiazide (HYZAAR) 100-25 MG tablet Take 0.5 tablets by mouth daily. 90 tablet 1   metoprolol succinate (TOPROL-XL) 100 MG 24 hr tablet Take 100 mg by mouth daily.     phentermine (ADIPEX-P) 37.5 MG tablet Take 37.5 mg by mouth daily.     sildenafil (VIAGRA) 100 MG tablet Take 1 tablet (100 mg total) by mouth daily as needed for erectile dysfunction. 12 tablet 5   Vedolizumab (ENTYVIO IV) Inject into the vein.     zolpidem (AMBIEN) 10 MG tablet TAKE 1/2 TO 1 TABLET(5 TO 10 MG) BY MOUTH AT BEDTIME AS NEEDED FOR SLEEP 90 tablet 1   HYDROcodone-acetaminophen (NORCO) 10-325 MG tablet Take 1 tablet by mouth every 6 (six) hours as needed. 20 tablet 0   amLODipine (NORVASC) 5 MG tablet Take 5 mg by mouth daily. (Patient not taking: Reported on 03/11/2022)     atorvastatin (LIPITOR) 20 MG tablet Take 20 mg by mouth at bedtime. Take 1 by mouth at bedtime     colesevelam (WELCHOL) 625 MG tablet Take 2 tablets (1,250 mg total) by mouth 2 (two) times daily with a meal. (Patient not taking: Reported on 02/03/2023) 120 tablet 11   dicyclomine (BENTYL) 10 MG capsule Take 10 mg by mouth 4 (four) times daily -  before meals and at bedtime.     methocarbamol  (ROBAXIN-750) 750 MG tablet Take 1 tablet (750 mg total) by mouth every 6 (six) hours as needed for muscle spasms. 40 tablet 1   methylPREDNISolone (MEDROL DOSEPAK) 4 MG TBPK tablet As directed 21 tablet 0   metoprolol succinate (TOPROL-XL) 25 MG 24 hr tablet TAKE 1 TABLET BY MOUTH EVERY DAY. ANNUAL APPT DUE IN JULY MUST SEE PROVIDER FOR FUTURE REFILLS 90 tablet 3   metroNIDAZOLE (FLAGYL) 500 MG tablet Take 1 tablet (500 mg total) by mouth 3 (three) times daily. 30 tablet 1   ondansetron (ZOFRAN-ODT) 8 MG disintegrating tablet Take 1 tablet (8 mg total) by mouth every 8 (eight) hours as needed for nausea or vomiting. 10 tablet 0   spironolactone (ALDACTONE) 25 MG tablet Take 25 mg by mouth daily.     tirzepatide (ZEPBOUND) 5 MG/0.5ML Pen Inject 5 mg into the skin once a week. 2 mL 3   venlafaxine XR (EFFEXOR-XR) 150 MG 24 hr capsule TAKE 1 CAPSULE(150 MG) BY MOUTH DAILY WITH BREAKFAST 30 capsule 0   No facility-administered medications prior to visit.    ROS: Review of Systems  Constitutional:  Negative for appetite change, fatigue and unexpected weight change.  HENT:  Negative for congestion, nosebleeds, sneezing, sore throat and trouble swallowing.   Eyes:  Negative for itching and visual disturbance.  Respiratory:  Positive for cough.   Cardiovascular:  Negative for chest pain, palpitations and leg swelling.  Gastrointestinal:  Negative for abdominal distention, blood in stool, diarrhea and nausea.  Genitourinary:  Negative for frequency and hematuria.  Musculoskeletal:  Negative for back pain, gait problem, joint swelling and neck pain.  Skin:  Negative for rash.  Neurological:  Negative for dizziness, tremors, speech difficulty and weakness.  Psychiatric/Behavioral:  Negative for agitation, dysphoric mood and sleep disturbance. The patient is not nervous/anxious.     Objective:  BP (!) 136/90 (BP Location: Left Arm, Patient Position: Sitting, Cuff Size: Large)   Pulse 75   Temp  98.9 F (37.2 C) (Oral)   Ht 6\' 3"  (1.905 m)   Wt 288 lb 3.2 oz (130.7 kg)   SpO2 96%   BMI 36.02 kg/m   BP Readings from Last 3 Encounters:  02/03/23 (!) 136/90  12/03/22 130/80  07/24/22 118/78    Wt Readings from Last 3 Encounters:  02/03/23 288 lb 3.2 oz (130.7 kg)  12/03/22 292 lb (132.5 kg)  07/24/22 289 lb (131.1 kg)    Physical Exam Constitutional:      General: He is not in acute distress.    Appearance: He is well-developed. He is obese.     Comments: NAD  Eyes:     Conjunctiva/sclera: Conjunctivae normal.     Pupils: Pupils are equal, round, and reactive to light.  Neck:     Thyroid: No thyromegaly.     Vascular: No JVD.  Cardiovascular:     Rate and Rhythm: Normal rate and regular rhythm.     Heart sounds: Normal heart sounds. No murmur heard.    No friction rub. No gallop.  Pulmonary:     Effort: Pulmonary effort is normal. No respiratory distress.     Breath sounds: Normal breath sounds. No wheezing or rales.  Chest:     Chest wall: No tenderness.  Abdominal:     General: Bowel sounds are normal. There is no distension.     Palpations: Abdomen is soft. There is no mass.     Tenderness: There is no abdominal tenderness. There is no guarding or rebound.  Musculoskeletal:        General: No tenderness. Normal range of motion.     Cervical back: Normal range of motion.  Lymphadenopathy:     Cervical: No cervical adenopathy.  Skin:    General: Skin is warm and dry.     Findings: No rash.  Neurological:     Mental Status: He is alert and oriented to person, place, and time.     Cranial Nerves: No cranial nerve deficit.     Motor: No abnormal muscle tone.     Coordination: Coordination normal.     Gait: Gait normal.     Deep Tendon Reflexes: Reflexes are normal and symmetric.  Psychiatric:        Behavior: Behavior normal.        Thought Content: Thought content normal.        Judgment: Judgment normal.     Lab Results  Component Value Date    WBC 6.6 07/02/2021   HGB 13.5 07/02/2021   HCT 40.1 07/02/2021   PLT 414.0 (H) 07/02/2021   GLUCOSE 98 07/02/2021   CHOL 146 07/02/2021   TRIG 82.0 07/02/2021   HDL 35.60 (L) 07/02/2021   LDLDIRECT 196.0 12/08/2007   LDLCALC 94 07/02/2021   ALT 21 07/02/2021   AST 18 07/02/2021   NA  141 07/02/2021   K 4.5 07/02/2021   CL 102 07/02/2021   CREATININE 1.45 07/02/2021   BUN 19 07/02/2021   CO2 29 07/02/2021   TSH 2.38 07/02/2021   PSA 0.24 07/02/2021   INR 0.9 10/10/2010   HGBA1C 5.8 06/28/2020    US RENAL  Result Date: 03/28/2022 CLINICAL DATA:  Abnormal GFR EXAM: RENAL / URINARY TRACT ULTRASOUND COMPLETE COMPARISON:  None Available. FINDINGS: Right Kidney: Renal measurements: 11.9 x 4.8 x 5.6 cm = volume: 164 mL. Echogenicity within normal limits. No mass or hydronephrosis visualized. Left Kidney: Renal measurements: 12.7 x 7.7 x 6.4 cm = volume: 338 mL. Echogenicity within normal limits. No mass or hydronephrosis visualized. Bladder: Appears normal for degree of bladder distention. Other: None. IMPRESSION: Normal study. No cause for the patient's symptoms identified. Electronically Signed   By: Gerome Sam III M.D.   On: 03/28/2022 12:24    Assessment & Plan:   Problem List Items Addressed This Visit     Hypogonadism in male    Goes to [facility] for testosterone, Phentermine, labs      Vitamin D deficiency    On Vit D      Obesity (BMI 35.0-39.9 without comorbidity)    Goes to [facility] for testosterone, Phentermine, labs Wt Readings from Last 3 Encounters:  02/03/23 288 lb 3.2 oz (130.7 kg)  12/03/22 292 lb (132.5 kg)  07/24/22 289 lb (131.1 kg)         Relevant Medications   phentermine (ADIPEX-P) 37.5 MG tablet   Crohn's disease of colon with rectal bleeding (HCC)    On Entevyo      Subacute cough - Primary    CXR Zpac      Relevant Orders   DG Chest 2 View      Meds ordered this encounter  Medications   azithromycin (ZITHROMAX Z-PAK)  250 MG tablet    Sig: As directed    Dispense:  6 tablet    Refill:  0      Follow-up: Return in about 6 weeks (around 03/17/2023) for a follow-up visit.  Sonda Primes, MD

## 2023-02-03 NOTE — Assessment & Plan Note (Signed)
CXR Zpac 

## 2023-02-03 NOTE — Assessment & Plan Note (Signed)
Goes to [facility] for testosterone, Phentermine, labs

## 2023-02-03 NOTE — Assessment & Plan Note (Signed)
On Vit D 

## 2023-02-09 ENCOUNTER — Other Ambulatory Visit: Payer: Self-pay | Admitting: Internal Medicine

## 2023-02-25 ENCOUNTER — Other Ambulatory Visit (HOSPITAL_COMMUNITY): Payer: Self-pay

## 2023-05-08 DIAGNOSIS — K50818 Crohn's disease of both small and large intestine with other complication: Secondary | ICD-10-CM | POA: Diagnosis not present

## 2023-05-12 ENCOUNTER — Other Ambulatory Visit: Payer: Self-pay | Admitting: Internal Medicine

## 2023-05-27 DIAGNOSIS — K50818 Crohn's disease of both small and large intestine with other complication: Secondary | ICD-10-CM | POA: Diagnosis not present

## 2023-06-17 ENCOUNTER — Ambulatory Visit: Payer: BC Managed Care – PPO | Admitting: Internal Medicine

## 2023-06-20 ENCOUNTER — Ambulatory Visit: Payer: BC Managed Care – PPO | Admitting: Internal Medicine

## 2023-07-03 ENCOUNTER — Telehealth: Payer: Self-pay | Admitting: Gastroenterology

## 2023-07-03 NOTE — Telephone Encounter (Signed)
 Good afternoon Dr. Garey Ham MD PM  The following patient is calling to transfer care to [place]. He has been a patient of [facility] but he no longer wants to continue care  with them because he now lives in [place] and has no transportation to go to [place]. Records are available in care everywhere in Epic. Please review and advise of scheduling. Thank you

## 2023-07-07 ENCOUNTER — Encounter (HOSPITAL_COMMUNITY): Payer: Self-pay

## 2023-07-07 ENCOUNTER — Emergency Department (HOSPITAL_COMMUNITY)
Admission: EM | Admit: 2023-07-07 | Discharge: 2023-07-07 | Disposition: A | Discharge status: Home / Self Care | Attending: Emergency Medicine | Admitting: Emergency Medicine

## 2023-07-07 ENCOUNTER — Emergency Department (HOSPITAL_COMMUNITY)
Admission: EM | Admit: 2023-07-07 | Discharge: 2023-07-08 | Disposition: A | Discharge status: Home / Self Care | Source: Home / Self Care | Attending: Emergency Medicine | Admitting: Emergency Medicine

## 2023-07-07 DIAGNOSIS — K921 Melena: Secondary | ICD-10-CM | POA: Insufficient documentation

## 2023-07-07 DIAGNOSIS — Z5321 Procedure and treatment not carried out due to patient leaving prior to being seen by health care provider: Secondary | ICD-10-CM | POA: Insufficient documentation

## 2023-07-07 DIAGNOSIS — K50911 Crohn's disease, unspecified, with rectal bleeding: Secondary | ICD-10-CM | POA: Insufficient documentation

## 2023-07-07 DIAGNOSIS — K922 Gastrointestinal hemorrhage, unspecified: Secondary | ICD-10-CM | POA: Diagnosis not present

## 2023-07-07 DIAGNOSIS — I10 Essential (primary) hypertension: Secondary | ICD-10-CM | POA: Diagnosis not present

## 2023-07-07 DIAGNOSIS — K5090 Crohn's disease, unspecified, without complications: Secondary | ICD-10-CM | POA: Diagnosis not present

## 2023-07-07 DIAGNOSIS — K625 Hemorrhage of anus and rectum: Secondary | ICD-10-CM | POA: Diagnosis not present

## 2023-07-07 LAB — CBC
HCT: 52.2 % — ABNORMAL HIGH (ref 39.0–52.0)
Hemoglobin: 16.2 g/dL (ref 13.0–17.0)
MCH: 25.4 pg — ABNORMAL LOW (ref 26.0–34.0)
MCHC: 31 g/dL (ref 30.0–36.0)
MCV: 81.9 fL (ref 80.0–100.0)
Platelets: 359 10*3/uL (ref 150–400)
RBC: 6.37 MIL/uL — ABNORMAL HIGH (ref 4.22–5.81)
RDW: 16.4 % — ABNORMAL HIGH (ref 11.5–15.5)
WBC: 9.8 10*3/uL (ref 4.0–10.5)
nRBC: 0 % (ref 0.0–0.2)

## 2023-07-07 LAB — COMPREHENSIVE METABOLIC PANEL
ALT: 20 U/L (ref 0–44)
AST: 24 U/L (ref 15–41)
Albumin: 3.7 g/dL (ref 3.5–5.0)
Alkaline Phosphatase: 69 U/L (ref 38–126)
Anion gap: 10 (ref 5–15)
BUN: 14 mg/dL (ref 6–20)
CO2: 24 mmol/L (ref 22–32)
Calcium: 9.2 mg/dL (ref 8.9–10.3)
Chloride: 104 mmol/L (ref 98–111)
Creatinine, Ser: 1.14 mg/dL (ref 0.61–1.24)
GFR, Estimated: >60 mL/min (ref >60)
Glucose, Bld: 99 mg/dL (ref 70–99)
Potassium: 3.6 mmol/L (ref 3.5–5.1)
Sodium: 138 mmol/L (ref 135–145)
Total Bilirubin: 0.6 mg/dL (ref 0.0–1.2)
Total Protein: 8 g/dL (ref 6.5–8.1)

## 2023-07-07 LAB — TYPE AND SCREEN
ABO/RH(D): O POS
Antibody Screen: NEGATIVE

## 2023-07-07 NOTE — ED Notes (Signed)
Pt called for room x3 with no answer. 

## 2023-07-07 NOTE — ED Triage Notes (Signed)
 Pt states that he has been having blood in his stool for the past month, c/o of both bright and dark red stools, was supposed to have colonoscopy but had problems with insurance. Pt c/o of weakness and SOB

## 2023-07-07 NOTE — Telephone Encounter (Signed)
 Reviewed  Pt is well established with Dr Steele Sizer and had extensive [facility] stay with Dr Steele Sizer RG

## 2023-07-07 NOTE — ED Provider Triage Note (Signed)
 Emergency Medicine Provider Triage Evaluation Note  Casey Kelly , a 48 y.o. male  was evaluated in triage.  Pt complains of retal bleeding.  Pt has a history of colitis.    Review of Systems  Positive: Rectal bleeding Negative: No abdominal pain  Physical Exam  BP (!) 178/109   Pulse (!) 101   Temp 98 F (36.7 C) (Oral)   Resp 18   SpO2 100%  Gen:   Awake, no distress   Resp:  Normal effort  MSK:   Moves extremities without difficulty  Other:    Medical Decision Making  Medically screening exam initiated at 8:19 PM.  Appropriate orders placed.  Casey Kelly was informed that the remainder of the evaluation will be completed by another provider, this initial triage assessment does not replace that evaluation, and the importance of remaining in the ED until their evaluation is complete.     Casey Kelly, [place] 07/07/23 2019

## 2023-07-08 DIAGNOSIS — K5090 Crohn's disease, unspecified, without complications: Secondary | ICD-10-CM | POA: Diagnosis not present

## 2023-07-08 DIAGNOSIS — I10 Essential (primary) hypertension: Secondary | ICD-10-CM | POA: Diagnosis not present

## 2023-07-08 DIAGNOSIS — N181 Chronic kidney disease, stage 1: Secondary | ICD-10-CM | POA: Diagnosis not present

## 2023-07-08 DIAGNOSIS — E782 Mixed hyperlipidemia: Secondary | ICD-10-CM | POA: Diagnosis not present

## 2023-07-08 MED ORDER — PREDNISONE 20 MG PO TABS: 40.0000 mg | ORAL_TABLET | Freq: Every day | ORAL | 0 refills | Status: DC

## 2023-07-08 MED ORDER — PREDNISONE 20 MG PO TABS
60.0000 mg | ORAL_TABLET | Freq: Once | ORAL | Status: AC
  Administered 2023-07-08: 60 mg via ORAL
  Filled 2023-07-08: qty 3

## 2023-07-08 NOTE — ED Notes (Signed)
 ED Provider at bedside.

## 2023-07-08 NOTE — ED Provider Notes (Signed)
 [facility] Emergency Department Provider Note MRN:  [id number]  Arrival date & time: 07/08/23     Chief Complaint   GI Bleeding   History of Present Illness   Casey Kelly is a 48 y.o. year-old male presents to the ED with chief complaint of rectal bleeding.  He has history of Crohn's.  States that he has been trying to get in with his GI doctor, but they are not able to see him until August.  He states that he has been trying to establish care in [place], but has been thus far unsuccessful.  He states that he had some leftover prednisone and was able to take 2 tablets which significantly helped his bleeding.  He reports that he has felt fatigued and short of breath.  History provided by patient.   Review of Systems  Pertinent positive and negative review of systems noted in HPI.    Physical Exam   Vitals:   07/07/23 2003 07/08/23 0041  BP: (!) 178/109 (!) 179/116  Pulse: (!) 101 83  Resp: 18 16  Temp: 98 F (36.7 C) 98.9 F (37.2 C)  SpO2: 100% 98%    CONSTITUTIONAL:  non toxic-appearing, NAD NEURO:  Alert and oriented x 3, CN 3-12 grossly intact EYES:  eyes equal and reactive ENT/NECK:  Supple, no stridor  CARDIO:  normal rate, regular rhythm, appears well-perfused  PULM:  No respiratory distress, CTAB GI/GU:  non-distended, mild abdominal discomfort, no focal tenderness MSK/SPINE:  No gross deformities, no edema, moves all extremities  SKIN:  no rash, atraumatic   *Additional and/or pertinent findings included in MDM below  Diagnostic and Interventional Summary    EKG Interpretation Date/Time:    Ventricular Rate:    PR Interval:    QRS Duration:    QT Interval:    QTC Calculation:   R Axis:      Text Interpretation:         Labs Reviewed  CBC - Abnormal; Notable for the following components:      Result Value   RBC 6.37 (*)    HCT 52.2 (*)    MCH 25.4 (*)    RDW 16.4 (*)    All other components within normal limits   COMPREHENSIVE METABOLIC PANEL  POC OCCULT BLOOD, ED  TYPE AND SCREEN  ABO/RH    No orders to display    Medications  predniSONE (DELTASONE) tablet 60 mg (has no administration in time range)     Procedures  /  Critical Care Procedures  ED Course and Medical Decision Making  I have reviewed the triage vital signs, the nursing notes, and pertinent available records from the EMR.  Social Determinants Affecting Complexity of Care: Patient has no clinically significant social determinants affecting this chief complaint..   ED Course:    Medical Decision Making Patient here with rectal bleeding.  Has history of Crohn's.  Sounds like he has had difficulty in getting follow-up with his doctor in [place] and has been trying to establish care here, but has been met with some resistance due to having a GI in [place].  Patient states that he cannot travel to [place], and this is why he wants to establish care in [place].  He is seeking to have a colonoscopy.  I discussed with the patient that while I am not able to offer that tonight from the emergency department, I do think that he would benefit from a prednisone taper.  He has been taking  Entyvio.  Labs are reassuring.  No significant electrolyte derangement.  His hemoglobin is baseline.  Vitals are stable.  I do not think that he needs further emergent workup or admission.  He asks if I can give him a referral to Dr. Loreta Ave (who he has heard good things about).  I told him that I can provide contact information, but can't make anyone accept him as a patient.  Amount and/or Complexity of Data Reviewed Labs: ordered.  Risk Prescription drug management.         Consultants: No consultations were needed in caring for this patient.   Treatment and Plan: I considered admission due to patient's initial presentation, but after considering the examination and diagnostic results, patient will not require admission and can  be discharged with outpatient follow-up.    Final Clinical Impressions(s) / ED Diagnoses     ICD-10-CM   1. Exacerbation of Crohn's disease with rectal bleeding [facility])  K50.911       ED Discharge Orders          Ordered    predniSONE (DELTASONE) 20 MG tablet  Daily        07/08/23 0202              Discharge Instructions Discussed with and Provided to Patient:   Discharge Instructions   None      Roxy Horseman, PA-C 07/08/23 1610    Long, Arlyss Repress, MD 07/08/23 [phone]

## 2023-07-08 NOTE — Discharge Instructions (Addendum)
 Take medications as directed.  Please try and follow-up with your GI doctor while you work on establishing care in [place].

## 2023-07-16 ENCOUNTER — Ambulatory Visit: Admitting: Internal Medicine

## 2023-07-16 ENCOUNTER — Encounter: Payer: Self-pay | Admitting: Internal Medicine

## 2023-07-16 VITALS — BP 130/80 | HR 72 | Temp 98.2°F | Ht 75.0 in | Wt 281.0 lb

## 2023-07-16 DIAGNOSIS — R197 Diarrhea, unspecified: Secondary | ICD-10-CM

## 2023-07-16 DIAGNOSIS — I10 Essential (primary) hypertension: Secondary | ICD-10-CM | POA: Diagnosis not present

## 2023-07-16 DIAGNOSIS — K921 Melena: Secondary | ICD-10-CM | POA: Diagnosis not present

## 2023-07-16 DIAGNOSIS — K5080 Crohn's disease of both small and large intestine without complications: Secondary | ICD-10-CM

## 2023-07-16 DIAGNOSIS — K50818 Crohn's disease of both small and large intestine with other complication: Secondary | ICD-10-CM | POA: Diagnosis not present

## 2023-07-16 MED ORDER — BUDESONIDE 3 MG PO CPEP: 9.0000 mg | ORAL_CAPSULE | Freq: Every day | ORAL | 2 refills | Status: DC

## 2023-07-16 NOTE — Assessment & Plan Note (Signed)
  Given Budesonide ER 9 mg/d po for now  Potential benefits of a long term steroid  use as well as potential risks  and complications were explained to the patient and were aknowledged.

## 2023-07-16 NOTE — Assessment & Plan Note (Addendum)
 Current rectal bleeding, colitis sx's - stool x 10/d before prednisone Rx Pt wants to switch from Dr Chesley Mires (his office is 50 min away) He went to ER - given Prednisone (finished) He is on Entyvio ( x2 years; last on 07/12/2023) - it is not working per pt Will ref to [facility] Given Budesonide po for now  Potential benefits of a long term steroid  use as well as potential risks  and complications were explained to the patient and were aknowledged.

## 2023-07-16 NOTE — Progress Notes (Signed)
 Subjective:  Patient ID: Casey Kelly, male    DOB: Feb 17, 1976  Age: 48 y.o. MRN: [id number]  CC: Hospitalization Follow-up (Discuss recent health concerns)   HPI Casey Kelly presents for rectal bleeding, colitis Pt wants to switch from Dr Chesley Mires (his office is 50 min away) He went to ER - given Prednisone He is on Entyvio ( x2 years; last on 07/12/2023) - not working  Outpatient Medications Prior to Visit  Medication Sig Dispense Refill   Avanafil (STENDRA) 100 MG TABS TAKE 1 TO 2 TABLETS BY MOUTH EVERY DAY AS NEEDED 25 tablet 5   Cholecalciferol (VITAMIN D3) 50 MCG (2000 UT) capsule Take 1 capsule (2,000 Units total) by mouth daily. 100 capsule 3   diphenoxylate-atropine (LOMOTIL) 2.5-0.025 MG tablet Take 1-2 tablets by mouth 4 (four) times daily as needed for diarrhea or loose stools. TAKE 1 TABLET BY MOUTH FOUR TIMES DAILY AS NEEDED FOR DIARRHEA OR LOOSE STOOLS 120 tablet 1   losartan-hydrochlorothiazide (HYZAAR) 100-25 MG tablet Take 0.5 tablets by mouth daily. 90 tablet 1   metoprolol succinate (TOPROL-XL) 100 MG 24 hr tablet Take 100 mg by mouth daily.     phentermine (ADIPEX-P) 37.5 MG tablet Take 37.5 mg by mouth daily.     sildenafil (VIAGRA) 100 MG tablet Take 1 tablet (100 mg total) by mouth daily as needed for erectile dysfunction. 12 tablet 5   Vedolizumab (ENTYVIO IV) Inject into the vein.     zolpidem (AMBIEN) 10 MG tablet TAKE 1/2 TO 1 TABLET(5 TO 10 MG) BY MOUTH AT BEDTIME AS NEEDED FOR SLEEP 90 tablet 0   azithromycin (ZITHROMAX Z-PAK) 250 MG tablet As directed 6 tablet 0   predniSONE (DELTASONE) 20 MG tablet Take 2 tablets (40 mg total) by mouth daily. Take 40 mg by mouth daily for 3 days, then 20mg  by mouth daily for 3 days, then 10mg  daily for 3 days 12 tablet 0   testosterone cypionate (DEPOTESTOSTERONE CYPIONATE) 200 MG/ML injection Inject into the muscle.     No facility-administered medications prior to visit.    ROS: Review of Systems   Constitutional:  Positive for fatigue. Negative for appetite change and unexpected weight change.  HENT:  Negative for congestion, nosebleeds, sneezing, sore throat and trouble swallowing.   Eyes:  Negative for itching and visual disturbance.  Respiratory:  Negative for cough.   Cardiovascular:  Negative for chest pain, palpitations and leg swelling.  Gastrointestinal:  Positive for blood in stool and diarrhea. Negative for abdominal distention, nausea and vomiting.  Genitourinary:  Negative for frequency and hematuria.  Musculoskeletal:  Negative for back pain, gait problem, joint swelling and neck pain.  Skin:  Negative for rash.  Neurological:  Negative for dizziness, tremors, speech difficulty and weakness.  Psychiatric/Behavioral:  Negative for agitation, dysphoric mood and sleep disturbance. The patient is not nervous/anxious.     Objective:  BP 130/80   Pulse 72   Temp 98.2 F (36.8 C) (Oral)   Ht 6\' 3"  (1.905 m)   Wt 281 lb (127.5 kg)   SpO2 96%   BMI 35.12 kg/m   BP Readings from Last 3 Encounters:  07/16/23 130/80  07/08/23 (!) 165/89  02/03/23 (!) 124/90    Wt Readings from Last 3 Encounters:  07/16/23 281 lb (127.5 kg)  07/08/23 286 lb 9.6 oz (130 kg)  02/03/23 288 lb 3.2 oz (130.7 kg)    Physical Exam Constitutional:      General: He is not in acute  distress.    Appearance: He is well-developed. He is obese.     Comments: NAD  Eyes:     Conjunctiva/sclera: Conjunctivae normal.     Pupils: Pupils are equal, round, and reactive to light.  Neck:     Thyroid: No thyromegaly.     Vascular: No JVD.  Cardiovascular:     Rate and Rhythm: Normal rate and regular rhythm.     Heart sounds: Normal heart sounds. No murmur heard.    No friction rub. No gallop.  Pulmonary:     Effort: Pulmonary effort is normal. No respiratory distress.     Breath sounds: Normal breath sounds. No wheezing or rales.  Chest:     Chest wall: No tenderness.  Abdominal:      General: Bowel sounds are normal. There is no distension.     Palpations: Abdomen is soft. There is no mass.     Tenderness: There is no abdominal tenderness. There is no guarding or rebound.  Musculoskeletal:        General: No tenderness. Normal range of motion.     Cervical back: Normal range of motion.  Lymphadenopathy:     Cervical: No cervical adenopathy.  Skin:    General: Skin is warm and dry.     Findings: No rash.  Neurological:     Mental Status: He is alert and oriented to person, place, and time.     Cranial Nerves: No cranial nerve deficit.     Motor: No abnormal muscle tone.     Coordination: Coordination normal.     Gait: Gait normal.     Deep Tendon Reflexes: Reflexes are normal and symmetric.  Psychiatric:        Behavior: Behavior normal.        Thought Content: Thought content normal.        Judgment: Judgment normal.   Sensitive abd  Lab Results  Component Value Date   WBC 9.8 07/07/2023   HGB 16.2 07/07/2023   HCT 52.2 (H) 07/07/2023   PLT 359 07/07/2023   GLUCOSE 99 07/07/2023   CHOL 146 07/02/2021   TRIG 82.0 07/02/2021   HDL 35.60 (L) 07/02/2021   LDLDIRECT 196.0 12/08/2007   LDLCALC 94 07/02/2021   ALT 20 07/07/2023   AST 24 07/07/2023   NA 138 07/07/2023   K 3.6 07/07/2023   CL 104 07/07/2023   CREATININE 1.14 07/07/2023   BUN 14 07/07/2023   CO2 24 07/07/2023   TSH 2.38 07/02/2021   PSA 0.24 07/02/2021   INR 0.9 10/10/2010   HGBA1C 5.8 06/28/2020    No results found.  Assessment & Plan:   Problem List Items Addressed This Visit     Essential hypertension   NAS diet Meds reviewed      Bloody stools    Given Budesonide ER 9 mg/d po for now  Potential benefits of a long term steroid  use as well as potential risks  and complications were explained to the patient and were aknowledged.      Crohn disease (HCC) - Primary   Current rectal bleeding, colitis sx's - stool x 10/d before prednisone Rx Pt wants to switch from Dr  Chesley Mires (his office is 50 min away) He went to ER - given Prednisone (finished) He is on Entyvio ( x2 years; last on 07/12/2023) - it is not working per pt Will ref to [facility] Given Budesonide po for now  Potential benefits of a long term steroid  use as well as potential risks  and complications were explained to the patient and were aknowledged.       Relevant Orders   Ambulatory referral to Gastroenterology   Diarrhea   Current rectal bleeding, colitis sx's Pt wants to switch from Dr Chesley Mires (his office is 50 min away) He went to ER - given Prednisone (finished) He is on Entyvio ( x2 years; last on 07/12/2023) - it is not working per pt Will ref to [facility] Given Budesonide po for now  Potential benefits of a long term steroid  use as well as potential risks  and complications were explained to the patient and were aknowledged.      Relevant Orders   Ambulatory referral to Gastroenterology      Meds ordered this encounter  Medications   budesonide (ENTOCORT EC) 3 MG 24 hr capsule    Sig: Take 3 capsules (9 mg total) by mouth daily.    Dispense:  90 capsule    Refill:  2      Follow-up: Return in about 3 months (around 10/16/2023) for a follow-up visit.  Sonda Primes, MD

## 2023-07-16 NOTE — Assessment & Plan Note (Signed)
NAS diet Meds reviewed

## 2023-07-16 NOTE — Assessment & Plan Note (Signed)
 Current rectal bleeding, colitis sx's Pt wants to switch from Dr Chesley Mires (his office is 50 min away) He went to ER - given Prednisone (finished) He is on Entyvio ( x2 years; last on 07/12/2023) - it is not working per pt Will ref to [facility] Given Budesonide po for now  Potential benefits of a long term steroid  use as well as potential risks  and complications were explained to the patient and were aknowledged.

## 2023-07-18 ENCOUNTER — Telehealth: Payer: Self-pay | Admitting: Internal Medicine

## 2023-07-18 NOTE — Telephone Encounter (Signed)
 Copied from CRM [id number]. Topic: Clinical - Prescription Issue >> Jul 18, 2023  8:53 AM Fredrich Romans wrote: Reason for CRM: Patient called in stating that pharmacy said that they do not have his prescription for budesonide (ENTOCORT EC) 3 MG 24 hr capsule that was sent in by provider on 07/16/2023.  ---  Please re-send RX

## 2023-07-21 ENCOUNTER — Other Ambulatory Visit: Payer: Self-pay

## 2023-07-21 ENCOUNTER — Telehealth: Payer: Self-pay | Admitting: Internal Medicine

## 2023-07-21 NOTE — Telephone Encounter (Signed)
 Copied from CRM [id number]. Topic: Clinical - Prescription Issue >> Jul 21, 2023 10:06 AM Eunice Blase wrote: Reason for CRM: Pt called stated that insurance will not cover Avanafil (STENDRA) 100 MG TABS. Pt would like a call back to discuss other options. Please call pt at [phone].

## 2023-07-22 DIAGNOSIS — K5190 Ulcerative colitis, unspecified, without complications: Secondary | ICD-10-CM | POA: Diagnosis not present

## 2023-07-24 NOTE — Telephone Encounter (Signed)
 She can use goodrx.com to look for a less expensive cash price.  Other alternatives are Viagra, Levitra and Cialis in a generic form. Thanks

## 2023-07-24 NOTE — Telephone Encounter (Signed)
 OK. Thx

## 2023-07-25 ENCOUNTER — Other Ambulatory Visit: Payer: Self-pay

## 2023-07-25 DIAGNOSIS — K5080 Crohn's disease of both small and large intestine without complications: Secondary | ICD-10-CM

## 2023-07-25 MED ORDER — BUDESONIDE 3 MG PO CPEP
9.0000 mg | ORAL_CAPSULE | Freq: Every day | ORAL | 2 refills | Status: AC
Start: 2023-07-25 — End: ?

## 2023-07-25 NOTE — Telephone Encounter (Signed)
 Resent the prescription 07/25/23 at 07:59 am

## 2023-07-31 NOTE — Telephone Encounter (Signed)
 Patient will weigh out options and reach back out to office when they decide how to proceed.

## 2023-08-20 DIAGNOSIS — K6389 Other specified diseases of intestine: Secondary | ICD-10-CM | POA: Diagnosis not present

## 2023-08-20 DIAGNOSIS — K633 Ulcer of intestine: Secondary | ICD-10-CM | POA: Diagnosis not present

## 2023-08-20 DIAGNOSIS — K5289 Other specified noninfective gastroenteritis and colitis: Secondary | ICD-10-CM | POA: Diagnosis not present

## 2023-08-20 DIAGNOSIS — K921 Melena: Secondary | ICD-10-CM | POA: Diagnosis not present

## 2023-09-03 DIAGNOSIS — K5190 Ulcerative colitis, unspecified, without complications: Secondary | ICD-10-CM | POA: Diagnosis not present

## 2023-09-08 ENCOUNTER — Telehealth: Payer: Self-pay

## 2023-09-08 NOTE — Telephone Encounter (Signed)
 Copied from CRM [id number]. Topic: Clinical - Medication Question >> Sep 08, 2023 12:45 PM Casey Kelly wrote: Reason for CRM: Patient is wanting to know if dr can prescribe him the 10 mg of the sildenafil  (VIAGRA )

## 2023-09-10 NOTE — Telephone Encounter (Signed)
 Is it for cost saving?  Regular Viagra  dose is 100 mg.  Where do I send it to and how many?  Thanks

## 2023-09-10 NOTE — Telephone Encounter (Signed)
 Attempted to reach out to the pt in reagards to the recent medication requested by the pt. Provider has stated the following "Is it for cost saving?  Regular Viagra  dose is 100 mg.  Where do I send it to and how many?  Thanks"  Please obtain the above information from pt upon his return call to the clinic.

## 2023-09-24 DIAGNOSIS — K51919 Ulcerative colitis, unspecified with unspecified complications: Secondary | ICD-10-CM | POA: Diagnosis not present

## 2023-10-03 ENCOUNTER — Telehealth: Payer: Self-pay | Admitting: Internal Medicine

## 2023-10-03 NOTE — Telephone Encounter (Signed)
 Copied from CRM [id number]. Topic: Clinical - Medication Question >> Oct 03, 2023  9:55 AM Alyse July wrote: Reason for CRM: Patient would like to know if he can be prescribed eye drop. Patient states he was previous prescribed these drops. [phone]

## 2023-10-07 NOTE — Telephone Encounter (Signed)
 Copied from CRM [id number]. Topic: Clinical - Medication Question >> Oct 03, 2023  9:55 AM Alyse July wrote: Reason for CRM: Patient would like to know if he can be prescribed eye drop. Patient states he was previous prescribed these drops. [phone] >> Oct 07, 2023 12:02 PM Corin V wrote: Patient called back about these eye drops. He could not remember the name or when he was last prescribed them. He stated they were an antibiotic eye drop but did not want tot give additional details during the call. He requested a call back today to speak to Dr. Tami Falcon nurse since he had been expecting a call back on Friday.

## 2023-10-07 NOTE — Telephone Encounter (Signed)
 Copied from CRM [id number]. Topic: Clinical - Medication Question >> Oct 07, 2023 11:59 AM Corin V wrote: Patient called in regaring not seeing this call. HE stated that the 100mg  don't work for him, but the 10mg  once every 2 days works well for him. The 100mg  make him drowsy and tired. Please send a 3 month supply [facility] [phone] - [place], [place] - [address]

## 2023-10-08 NOTE — Telephone Encounter (Signed)
 I do not see any eyedrops on file.  Please schedule office visit with one of us  or with ophthalmology.  Thanks

## 2023-10-09 MED ORDER — SILDENAFIL CITRATE 20 MG PO TABS: ORAL_TABLET | ORAL | 3 refills | Status: AC

## 2023-10-09 NOTE — Telephone Encounter (Signed)
 Okay.  Done.  Thanks

## 2023-10-09 NOTE — Telephone Encounter (Signed)
 Okay sildenafil  10 mg every other day as needed Thanks

## 2023-10-09 NOTE — Addendum Note (Signed)
 Addended by: Selisa Tensley V on: 10/09/2023 07:41 AM   Modules accepted: Orders

## 2023-10-20 DIAGNOSIS — K5190 Ulcerative colitis, unspecified, without complications: Secondary | ICD-10-CM | POA: Diagnosis not present

## 2023-10-22 DIAGNOSIS — K5190 Ulcerative colitis, unspecified, without complications: Secondary | ICD-10-CM | POA: Diagnosis not present

## 2023-10-27 DIAGNOSIS — H1711 Central corneal opacity, right eye: Secondary | ICD-10-CM | POA: Diagnosis not present

## 2023-10-27 DIAGNOSIS — H25011 Cortical age-related cataract, right eye: Secondary | ICD-10-CM | POA: Diagnosis not present

## 2023-11-19 DIAGNOSIS — E782 Mixed hyperlipidemia: Secondary | ICD-10-CM | POA: Diagnosis not present

## 2023-11-19 DIAGNOSIS — N182 Chronic kidney disease, stage 2 (mild): Secondary | ICD-10-CM | POA: Diagnosis not present

## 2023-11-19 DIAGNOSIS — K5090 Crohn's disease, unspecified, without complications: Secondary | ICD-10-CM | POA: Diagnosis not present

## 2023-11-19 DIAGNOSIS — K5190 Ulcerative colitis, unspecified, without complications: Secondary | ICD-10-CM | POA: Diagnosis not present

## 2023-11-19 DIAGNOSIS — I10 Essential (primary) hypertension: Secondary | ICD-10-CM | POA: Diagnosis not present

## 2023-12-22 ENCOUNTER — Ambulatory Visit: Payer: Self-pay

## 2023-12-22 NOTE — Telephone Encounter (Signed)
 FYI Only or Action Required?: Action required by provider: clinical question for provider and update on patient condition. Patient would like direct admission to hospital for GI work up.    Patient was last seen in primary care on 07/16/2023 by Plotnikov, Karlynn GAILS, MD.  Called Nurse Triage reporting Rectal Bleeding.  Symptoms began several months ago.  Interventions attempted: Prescription medications: mesalamine , tremfiya.  Symptoms are: gradually worsening.  Triage Disposition: Go to ED Now (Notify PCP)  Patient/caregiver understands and will follow disposition?: Unsure   Copied from CRM [id number]. Topic: Clinical - Red Word Triage >> Dec 22, 2023  3:33 PM Frederich PARAS wrote: [facility] that prompted transfer to Nurse Triage: Bleeding n stool   Pt wants a pt with dr Garald fry he wanted labs done Reason for Disposition  SEVERE rectal bleeding (e.g., large blood clots; constant or on and off bleeding)  Answer Assessment - Initial Assessment Questions Patient can be reached at [phone] Underlying UC and Chrons, being followed by GI, symptoms worsening   1. APPEARANCE of BLOOD: What color is it? Is it passed separately, on the surface of the stool, or mixed in with the stool?      Mixed into stool, mucous and blood 2. AMOUNT: How much blood was passed?      Large amount 3. FREQUENCY: How many times has blood been passed with the stools?      Of and on for the past year 4. ONSET: When was the blood first seen in the stools? (Days or weeks)      Months ago 5. DIARRHEA: Is there also some diarrhea? If Yes, ask: How many diarrhea stools in the past 24 hours?      Eight, average 7-8 times per day 6. CONSTIPATION: Do you have constipation? If Yes, ask: How bad is it?     denies 7. RECURRENT SYMPTOMS: Have you had blood in your stools before? If Yes, ask: When was the last time? and What happened that time?       8. BLOOD THINNERS: Do you take any blood  thinners? (e.g., aspirin, clopidogrel / Plavix, coumadin, heparin). Notes: Other strong blood thinners include: Arixtra (fondaparinux), Eliquis (apixaban), Pradaxa (dabigatran), and Xarelto (rivaroxaban).     denies 9. OTHER SYMPTOMS: Do you have any other symptoms?  (e.g., abdomen pain, vomiting, dizziness, fever)     Passed out at work three days ago, Friday Admits to dizziness all the time 10. PREGNANCY: Is there any chance you are pregnant? When was your last menstrual period?       N/A  Protocols used: Rectal Bleeding-A-AH

## 2023-12-24 NOTE — Telephone Encounter (Signed)
 Please go to ER if severe rectal bleeding.  If bleeding stopped, please call GI to make an appointment.  Thanks

## 2023-12-25 NOTE — Telephone Encounter (Signed)
 Pts PCP has stated Please go to ER if severe rectal bleeding.  If bleeding stopped, please call GI to make an appointment.  Thanks  Pt did not answer when I tried to call to inform him of the above per PCP advise.

## 2024-01-01 DIAGNOSIS — K921 Melena: Secondary | ICD-10-CM | POA: Diagnosis not present

## 2024-01-05 DIAGNOSIS — I10 Essential (primary) hypertension: Secondary | ICD-10-CM | POA: Diagnosis not present

## 2024-01-05 DIAGNOSIS — E782 Mixed hyperlipidemia: Secondary | ICD-10-CM | POA: Diagnosis not present

## 2024-01-05 DIAGNOSIS — R7303 Prediabetes: Secondary | ICD-10-CM | POA: Diagnosis not present

## 2024-01-05 DIAGNOSIS — E559 Vitamin D deficiency, unspecified: Secondary | ICD-10-CM | POA: Diagnosis not present

## 2024-01-15 DIAGNOSIS — K5080 Crohn's disease of both small and large intestine without complications: Secondary | ICD-10-CM | POA: Diagnosis not present

## 2024-01-15 DIAGNOSIS — K50811 Crohn's disease of both small and large intestine with rectal bleeding: Secondary | ICD-10-CM | POA: Diagnosis not present

## 2024-01-17 DIAGNOSIS — K921 Melena: Secondary | ICD-10-CM | POA: Diagnosis not present

## 2024-01-17 DIAGNOSIS — Z0389 Encounter for observation for other suspected diseases and conditions ruled out: Secondary | ICD-10-CM | POA: Diagnosis not present

## 2024-01-17 DIAGNOSIS — Z59811 Housing instability, housed, with risk of homelessness: Secondary | ICD-10-CM | POA: Diagnosis not present

## 2024-01-17 DIAGNOSIS — I10 Essential (primary) hypertension: Secondary | ICD-10-CM | POA: Diagnosis not present

## 2024-01-17 DIAGNOSIS — R195 Other fecal abnormalities: Secondary | ICD-10-CM | POA: Diagnosis not present

## 2024-01-17 DIAGNOSIS — K625 Hemorrhage of anus and rectum: Secondary | ICD-10-CM | POA: Diagnosis not present

## 2024-01-17 DIAGNOSIS — Z5986 Financial insecurity: Secondary | ICD-10-CM | POA: Diagnosis not present

## 2024-01-18 DIAGNOSIS — Z5986 Financial insecurity: Secondary | ICD-10-CM | POA: Diagnosis not present

## 2024-01-18 DIAGNOSIS — Z59811 Housing instability, housed, with risk of homelessness: Secondary | ICD-10-CM | POA: Diagnosis not present

## 2024-02-02 ENCOUNTER — Encounter (HOSPITAL_COMMUNITY): Payer: Self-pay

## 2024-02-02 ENCOUNTER — Other Ambulatory Visit: Payer: Self-pay

## 2024-02-02 ENCOUNTER — Other Ambulatory Visit (HOSPITAL_COMMUNITY): Payer: Self-pay

## 2024-02-02 MED ORDER — TREMFYA PEN 200 MG/2ML ~~LOC~~ SOAJ
SUBCUTANEOUS | 3 refills | Status: AC
  Filled 2024-02-03: qty 2, 28d supply, fill #0
  Filled 2024-02-26: qty 2, 28d supply, fill #1
  Filled 2024-03-23: qty 2, 28d supply, fill #2
  Filled 2024-04-21: qty 2, 28d supply, fill #3

## 2024-02-02 NOTE — Progress Notes (Signed)
 Pharmacy Patient Advocate Encounter  Insurance verification completed.   The patient is insured through [facility]   Ran test claim for Tremfya . Co-pay is $0.  This test claim was processed through [facility]- copay amounts may vary at other pharmacies due to pharmacy/plan contracts, or as the patient moves through the different stages of their insurance plan.

## 2024-02-03 ENCOUNTER — Encounter (HOSPITAL_COMMUNITY): Payer: Self-pay

## 2024-02-03 ENCOUNTER — Other Ambulatory Visit: Payer: Self-pay

## 2024-02-03 ENCOUNTER — Other Ambulatory Visit (HOSPITAL_COMMUNITY): Payer: Self-pay

## 2024-02-03 NOTE — Progress Notes (Signed)
 Specialty Pharmacy Initial Fill Coordination Note  Casey Kelly is a 48 y.o. male contacted today regarding initial fill of specialty medication(s) Guselkumab  (Tremfya  Pen)   Patient requested [name] at [facility] at [place] date: 02/04/24   Medication will be filled on 02/04/24.   Patient is aware of $0 copayment.

## 2024-02-03 NOTE — Progress Notes (Signed)
 Specialty Pharmacy Initiation Note   Casey Kelly is a 48 y.o. male who will be followed by the specialty pharmacy service for RxSp Crohn's Disease    Review of administration, indication, effectiveness, safety, potential side effects, storage/disposable, and missed dose instructions occurred today for patient's specialty medication(s) Guselkumab  (Tremfya  Pen)     Patient/Caregiver did not have any additional questions or concerns.   Patient's therapy is appropriate to: Initiate (Patient has had induction dose via infusion so far.)    Goals Addressed             This Visit's Progress    Stabilization of disease       Patient is initiating therapy. Patient will maintain adherence and adhere to provider and/or lab appointments         [name] Specialty Pharmacist

## 2024-02-04 ENCOUNTER — Other Ambulatory Visit: Payer: Self-pay

## 2024-02-04 ENCOUNTER — Other Ambulatory Visit (HOSPITAL_COMMUNITY): Payer: Self-pay

## 2024-02-09 ENCOUNTER — Other Ambulatory Visit: Payer: Self-pay

## 2024-02-09 DIAGNOSIS — K50811 Crohn's disease of both small and large intestine with rectal bleeding: Secondary | ICD-10-CM | POA: Diagnosis not present

## 2024-02-26 ENCOUNTER — Other Ambulatory Visit: Payer: Self-pay

## 2024-02-26 ENCOUNTER — Encounter (INDEPENDENT_AMBULATORY_CARE_PROVIDER_SITE_OTHER): Payer: Self-pay

## 2024-02-26 NOTE — Progress Notes (Signed)
 Clinical Intervention Note  Clinical Intervention Notes: Patient reports starting slippery elm, aloe vera, and glutamine suppliments, no DDIs were identified with his Tremfya .   Clinical Intervention Outcomes: Prevention of an adverse drug event   Casey Kelly Blair Karel Santa

## 2024-02-26 NOTE — Progress Notes (Signed)
 Specialty Pharmacy Refill Coordination Note  Casey Kelly is a 47 y.o. male contacted today regarding refills of specialty medication(s) Guselkumab  (Tremfya  Pen)   Patient requested [name] at [facility] at [place] date: 03/03/24   Medication will be filled on: 03/02/24

## 2024-03-01 ENCOUNTER — Other Ambulatory Visit: Payer: Self-pay

## 2024-03-03 DIAGNOSIS — I10 Essential (primary) hypertension: Secondary | ICD-10-CM | POA: Diagnosis not present

## 2024-03-03 DIAGNOSIS — N182 Chronic kidney disease, stage 2 (mild): Secondary | ICD-10-CM | POA: Diagnosis not present

## 2024-03-03 DIAGNOSIS — E782 Mixed hyperlipidemia: Secondary | ICD-10-CM | POA: Diagnosis not present

## 2024-03-03 DIAGNOSIS — K5090 Crohn's disease, unspecified, without complications: Secondary | ICD-10-CM | POA: Diagnosis not present

## 2024-03-23 ENCOUNTER — Other Ambulatory Visit: Payer: Self-pay

## 2024-03-23 ENCOUNTER — Other Ambulatory Visit (HOSPITAL_COMMUNITY): Payer: Self-pay

## 2024-03-23 NOTE — Progress Notes (Signed)
 Specialty Pharmacy Refill Coordination Note  Casey Kelly is a 48 y.o. male contacted today regarding refills of specialty medication(s) Guselkumab  (Tremfya  Pen)   Patient requested [name] at [facility] at [place] date: 03/27/24   Medication will be filled on: 03/26/24

## 2024-04-06 DIAGNOSIS — K50811 Crohn's disease of both small and large intestine with rectal bleeding: Secondary | ICD-10-CM | POA: Diagnosis not present

## 2024-04-21 ENCOUNTER — Other Ambulatory Visit (HOSPITAL_COMMUNITY): Payer: Self-pay

## 2024-04-26 ENCOUNTER — Other Ambulatory Visit (HOSPITAL_COMMUNITY): Payer: Self-pay

## 2024-04-26 ENCOUNTER — Other Ambulatory Visit: Payer: Self-pay

## 2024-04-26 NOTE — Progress Notes (Signed)
 Specialty Pharmacy Refill Coordination Note  Casey Kelly is a 48 y.o. male contacted today regarding refills of specialty medication(s) Guselkumab  (Tremfya  Pen)   Patient requested [name] at [facility] at [place] date: 04/27/24   Medication will be filled on: 04/26/24

## 2024-05-03 ENCOUNTER — Other Ambulatory Visit (HOSPITAL_COMMUNITY): Payer: Self-pay

## 2024-05-04 ENCOUNTER — Ambulatory Visit
Admission: RE | Admit: 2024-05-04 | Discharge: 2024-05-04 | Disposition: A | Discharge status: Home / Self Care | Attending: Gastroenterology | Admitting: Gastroenterology

## 2024-05-04 ENCOUNTER — Other Ambulatory Visit: Payer: Self-pay | Admitting: Gastroenterology

## 2024-05-04 ENCOUNTER — Ambulatory Visit: Admitting: Anesthesiology

## 2024-05-04 ENCOUNTER — Encounter: Payer: Self-pay | Admitting: Gastroenterology

## 2024-05-04 ENCOUNTER — Other Ambulatory Visit: Payer: Self-pay

## 2024-05-04 ENCOUNTER — Encounter
Admission: RE | Disposition: A | Discharge status: Home / Self Care | Payer: Self-pay | Source: Home / Self Care | Attending: Gastroenterology

## 2024-05-04 DIAGNOSIS — I10 Essential (primary) hypertension: Secondary | ICD-10-CM | POA: Diagnosis not present

## 2024-05-04 DIAGNOSIS — K5730 Diverticulosis of large intestine without perforation or abscess without bleeding: Secondary | ICD-10-CM | POA: Diagnosis not present

## 2024-05-04 DIAGNOSIS — K6289 Other specified diseases of anus and rectum: Secondary | ICD-10-CM | POA: Diagnosis not present

## 2024-05-04 DIAGNOSIS — K5010 Crohn's disease of large intestine without complications: Secondary | ICD-10-CM | POA: Diagnosis present

## 2024-05-04 HISTORY — PX: FLEXIBLE SIGMOIDOSCOPY: SHX5431

## 2024-05-04 HISTORY — PX: RECTAL BIOPSY: SHX2303

## 2024-05-04 SURGERY — SIGMOIDOSCOPY, FLEXIBLE
Anesthesia: Monitor Anesthesia Care

## 2024-05-04 MED ORDER — SODIUM CHLORIDE 0.9 % IV SOLN: INTRAVENOUS | Status: DC

## 2024-05-04 MED ORDER — PROPOFOL 500 MG/50ML IV EMUL
INTRAVENOUS | Status: DC | PRN
  Administered 2024-05-04: 50 mg via INTRAVENOUS
  Administered 2024-05-04: 125 ug/kg/min via INTRAVENOUS

## 2024-05-04 MED ORDER — LIDOCAINE HCL (CARDIAC) PF 100 MG/5ML IV SOSY
PREFILLED_SYRINGE | INTRAVENOUS | Status: DC | PRN
  Administered 2024-05-04: 50 mg via INTRAVENOUS

## 2024-05-04 NOTE — Op Note (Signed)
 [facility] Patient Name: Casey Kelly Procedure Date: 05/04/2024 9:41 AM MRN: [id number] Account #: [id number] Date of Birth: 05-03-1975 Admit Type: Outpatient Age: 49 Room: [facility] ENDO ROOM 3 Gender: Male Note Status: Finalized Instrument Name: Upper GI Scope [id number] Procedure:             Flexible Sigmoidoscopy Indications:           Disease activity assessment of Crohn's disease of the                         colon, Assess therapeutic response to therapy of                         Crohn's disease of the colon Providers:             Corinn Jess Brooklyn MD, MD Referring MD:          Karlynn GAILS. Plotnikov MD, MD (Referring MD) Medicines:             General Anesthesia Complications:         No immediate complications. Estimated blood loss: None. Procedure:             Pre-Anesthesia Assessment:                        - Prior to the procedure, a History and Physical was                         performed, and patient medications and allergies were                         reviewed. The patient is competent. The risks and                         benefits of the procedure and the sedation options and                         risks were discussed with the patient. All questions                         were answered and informed consent was obtained.                         Patient identification and proposed procedure were                         verified by the physician, the nurse, the                         anesthesiologist, the anesthetist and the technician                         in the pre-procedure area in the procedure room in the                         endoscopy suite. Mental Status Examination: alert and                         oriented. Airway Examination: normal oropharyngeal  airway and neck mobility. Respiratory Examination:                         clear to auscultation. CV Examination: normal.                          Prophylactic Antibiotics: The patient does not require                         prophylactic antibiotics. Prior Anticoagulants: The                         patient has taken no anticoagulant or antiplatelet                         agents. ASA Grade Assessment: II - A patient with mild                         systemic disease. After reviewing the risks and                         benefits, the patient was deemed in satisfactory                         condition to undergo the procedure. The anesthesia                         plan was to use general anesthesia. Immediately prior                         to administration of medications, the patient was                         re-assessed for adequacy to receive sedatives. The                         heart rate, respiratory rate, oxygen saturations,                         blood pressure, adequacy of pulmonary ventilation, and                         response to care were monitored throughout the                         procedure. The physical status of the patient was                         re-assessed after the procedure.                        After obtaining informed consent, the scope was passed                         under direct vision. The Endoscope was introduced                         through the anus and advanced to the the descending  colon. The flexible sigmoidoscopy was accomplished                         without difficulty. The patient tolerated the                         procedure well. The quality of the bowel preparation                         was fair. Findings:      The perianal and digital rectal examinations were normal. Pertinent       negatives include normal sphincter tone and no palpable rectal lesions.      A diffuse area of mildly erythematous mucosa was found in the distal       rectum. Biopsies were taken with a cold forceps for histology.      A few small-mouthed diverticula were found  in the sigmoid colon.      Normal mucosa was found in the sigmoid colon and in the descending       colon. Biopsies were taken with a cold forceps for histology. Impression:            - Preparation of the colon was fair.                        - Erythematous mucosa in the distal rectum. Biopsied.                        - Diverticulosis in the sigmoid colon.                        - Normal mucosa in the sigmoid colon and in the                         descending colon. Biopsied. Recommendation:        - Discharge patient to home (with escort).                        - Resume previous diet today.                        - Continue present medications.                        - Await pathology results. Procedure Code(s):     --- Professional ---                        [phone], Sigmoidoscopy, flexible; with biopsy, single or                         multiple Diagnosis Code(s):     --- Professional ---                        K62.89, Other specified diseases of anus and rectum                        K50.10, Crohn's disease of large intestine without  complications                        K57.30, Diverticulosis of large intestine without                         perforation or abscess without bleeding CPT copyright 2022 American Medical Association. All rights reserved. The codes documented in this report are preliminary and upon coder review may  be revised to meet current compliance requirements. Dr. Corinn Brooklyn Corinn Jess Brooklyn MD, MD 05/04/2024 10:36:19 AM This report has been signed electronically. Number of Addenda: 0 Note Initiated On: 05/04/2024 9:41 AM Total Procedure Duration: 0 hours 6 minutes 33 seconds  Estimated Blood Loss:  Estimated blood loss: none.      [facility]

## 2024-05-04 NOTE — Transfer of Care (Signed)
 Immediate Anesthesia Transfer of Care Note  Patient: Casey Kelly  Procedure(s) Performed: KINGSTON SIDE  Patient Location: PACU  Anesthesia Type:MAC  Level of Consciousness: sedated  Airway & Oxygen Therapy: Patient Spontanous Breathing  Post-op Assessment: Report given to RN and Post -op Vital signs reviewed and stable  Post vital signs: stable  Last Vitals:  Vitals Value Taken Time  BP 127/87 05/04/24 10:33  Temp    Pulse 73 05/04/24 10:34  Resp 29 05/04/24 10:34  SpO2 97 % 05/04/24 10:34  Vitals shown include unfiled device data.  Last Pain:  Vitals:   05/04/24 0953  TempSrc: Tympanic  PainSc: 0-No pain         Complications: No notable events documented.

## 2024-05-04 NOTE — Anesthesia Postprocedure Evaluation (Signed)
"   Anesthesia Post Note  Patient: Casey Kelly  Procedure(s) Performed: KINGSTON SIDE  Patient location during evaluation: PACU Anesthesia Type: MAC Level of consciousness: awake and alert Pain management: pain level controlled Vital Signs Assessment: post-procedure vital signs reviewed and stable Respiratory status: spontaneous breathing, nonlabored ventilation, respiratory function stable and patient connected to nasal cannula oxygen Cardiovascular status: blood pressure returned to baseline and stable Postop Assessment: no apparent nausea or vomiting Anesthetic complications: no   No notable events documented.   Last Vitals:  Vitals:   05/04/24 0953 05/04/24 1033  BP: (!) 153/101 127/87  Pulse: 71 71  Resp: 18 (!) 29  Temp: (!) 35.8 C (!) 36.1 C  SpO2: 100% 96%    Last Pain:  Vitals:   05/04/24 1033  TempSrc: Temporal  PainSc: 0-No pain                 [name]      "

## 2024-05-04 NOTE — Anesthesia Preprocedure Evaluation (Signed)
"                                    Anesthesia Evaluation  Patient identified by MRN, date of birth, ID band Patient awake    Reviewed: Allergy & Precautions, H&P , NPO status , Patient's Chart, lab work & pertinent test results  Airway Mallampati: III  TM Distance: >3 FB Neck ROM: Full    Dental no notable dental hx.    Pulmonary neg pulmonary ROS   Pulmonary exam normal breath sounds clear to auscultation       Cardiovascular hypertension, negative cardio ROS Normal cardiovascular examI Rhythm:Regular Rate:Normal     Neuro/Psych negative neurological ROS  negative psych ROS   GI/Hepatic negative GI ROS, Neg liver ROS,,,  Endo/Other  negative endocrine ROS    Renal/GU negative Renal ROS  negative genitourinary   Musculoskeletal negative musculoskeletal ROS (+)    Abdominal   Peds negative pediatric ROS (+)  Hematology negative hematology ROS (+)   Anesthesia Other Findings   Reproductive/Obstetrics negative OB ROS                              Anesthesia Physical Anesthesia Plan  ASA: 2  Anesthesia Plan: MAC and General   Post-op Pain Management: Minimal or no pain anticipated   Induction: Intravenous  PONV Risk Score and Plan: 0  Airway Management Planned: Mask  Additional Equipment:   Intra-op Plan:   Post-operative Plan:   Informed Consent: I have reviewed the patients History and Physical, chart, labs and discussed the procedure including the risks, benefits and alternatives for the proposed anesthesia with the patient or authorized representative who has indicated his/her understanding and acceptance.     Dental advisory given  Plan Discussed with: CRNA  Anesthesia Plan Comments:         Anesthesia Quick Evaluation  "

## 2024-05-04 NOTE — H&P (Signed)
 "   Corinn JONELLE Brooklyn, MD [facility], DHIP [address]  [place], [place] [address]  Main: [phone] Fax:  [phone] Pager: [phone]   Primary Care Physician:  Garald Karlynn GAILS, MD Primary Gastroenterologist:  Dr. Corinn JONELLE Brooklyn  Pre-Procedure History & Physical: HPI:  Casey Kelly is a 49 y.o. male is here for an flexible sigmoidoscopy.   Past Medical History:  Diagnosis Date   Allergic rhinitis    Bruxism    Crohn's disease (HCC)    no current med.   History of chronic sinusitis    History of kidney stones    History of kidney stones    Hypertension    Hypogonadism male    Left ureteral stone    Mild obstructive sleep apnea    per study 10-05-2009--  INTERMITTANT CPAP USE   Nephrolithiasis    bilateral  non-obstructive per urologist note   Plantar fasciitis 2010   bilateral   Sleep apnea    CPAP use   Wears contact lenses     Past Surgical History:  Procedure Laterality Date   ADENOIDECTOMY  as child   COLONOSCOPY  2012   CYSTOSCOPY/URETEROSCOPY/HOLMIUM LASER/STENT PLACEMENT Bilateral 08/15/2015   Procedure: CYSTOSCOPY/ LEFT URETEROSCOPY, POSSIBLE RIGHT URETEROSCOPY /HOLMIUM LASER/STENT PLACEMENT;  Surgeon: Redell Lynwood Napoleon, MD;  Location: [facility];  Service: Urology;  Laterality: Bilateral;   ETHMOIDECTOMY Bilateral 07/17/2009   anterior endoscopic , polypectomy, and bilateral maxillay antrostomy   EXTRACORPOREAL SHOCK WAVE LITHOTRIPSY Right 06/18/2021   Procedure: EXTRACORPOREAL SHOCK WAVE LITHOTRIPSY (ESWL);  Surgeon: Alvaro Hummer, MD;  Location: [facility];  Service: Urology;  Laterality: Right;   HAND SURGERY Right    fingertip amputation   HOLMIUM LASER APPLICATION Bilateral 08/15/2015   Procedure: HOLMIUM LASER APPLICATION;  Surgeon: Redell Lynwood Napoleon, MD;  Location: [facility];  Service: Urology;  Laterality: Bilateral;   NASAL SINUS SURGERY  2016   at [facility]    TYMPANOSTOMY TUBE PLACEMENT  as child    Prior to Admission medications  Medication Sig Start Date End Date Taking? Authorizing Provider  Avanafil  (STENDRA ) 100 MG TABS TAKE 1 TO 2 TABLETS BY MOUTH EVERY DAY AS NEEDED 02/10/23   Plotnikov, Aleksei V, MD  budesonide  (ENTOCORT EC ) 3 MG 24 hr capsule Take 3 capsules (9 mg total) by mouth daily. 07/25/23   Plotnikov, Aleksei V, MD  Cholecalciferol (VITAMIN D3) 50 MCG (2000 UT) capsule Take 1 capsule (2,000 Units total) by mouth daily. 02/11/19   Plotnikov, Aleksei V, MD  diphenoxylate -atropine  (LOMOTIL ) 2.5-0.025 MG tablet Take 1-2 tablets by mouth 4 (four) times daily as needed for diarrhea or loose stools. TAKE 1 TABLET BY MOUTH FOUR TIMES DAILY AS NEEDED FOR DIARRHEA OR LOOSE STOOLS 07/02/21   Plotnikov, Aleksei V, MD  Guselkumab  (TREMFYA  PEN) 200 MG/2ML SOAJ Inject 2 mLs (200 mg total) subcutaneously every 28 (twenty-eight) days for 30 days 02/02/24     losartan -hydrochlorothiazide  (HYZAAR) 100-25 MG tablet Take 0.5 tablets by mouth daily. 03/11/22   Plotnikov, Aleksei V, MD  metoprolol  succinate (TOPROL -XL) 100 MG 24 hr tablet Take 100 mg by mouth daily.    [provider]  phentermine (ADIPEX-P) 37.5 MG tablet Take 37.5 mg by mouth daily. 01/14/23   [provider]  sildenafil  (REVATIO ) 20 MG tablet Take 10 mg every other day as needed 10/09/23   Plotnikov, Aleksei V, MD  testosterone  cypionate (DEPOTESTOSTERONE CYPIONATE) 200 MG/ML injection Inject into the muscle.  [provider]  Vedolizumab  (ENTYVIO  IV) Inject into the vein.    [provider]  zolpidem  (AMBIEN ) 10 MG tablet TAKE 1/2 TO 1 TABLET(5 TO 10 MG) BY MOUTH AT BEDTIME AS NEEDED FOR SLEEP 05/13/23   Plotnikov, Karlynn GAILS, MD    Allergies as of 04/06/2024 - Review Complete 07/16/2023  Allergen Reaction Noted   Banana extract allergy skin test Other (See Comments) 04/09/2019    Family History  Problem Relation Age of Onset   Allergic rhinitis  Mother    Breast cancer Mother    Lung cancer Father 74   Colon polyps Father    Prostate cancer Father    Allergic rhinitis Daughter    Allergic rhinitis Son    Asthma Son    Colon cancer Neg Hx     Social History   Socioeconomic History   Marital status: Married    Spouse name: Not on file   Number of children: 3   Years of education: Not on file   Highest education level: Not on file  Occupational History   Occupation: Secondary School Teacher: [employer]   Occupation: Gaffer: OTHER  Tobacco Use   Smoking status: Never   Smokeless tobacco: Never  Vaping Use   Vaping status: Never Used  Substance and Sexual Activity   Alcohol use: No   Drug use: No   Sexual activity: Yes  Other Topics Concern   Not on file  Social History Narrative   Regular exercise- No   1 Caffeine drink daily    Social Drivers of Health   Tobacco Use: Low Risk (07/16/2023)   Patient History    Smoking Tobacco Use: Never    Smokeless Tobacco Use: Never    Passive Exposure: Not on file  Financial Resource Strain: High Risk (01/15/2024)   Received from [facility]   Overall Financial Resource Strain (CARDIA)    Difficulty of Paying Living Expenses: Very hard  Food Insecurity: Food Insecurity Present (01/15/2024)   [facility] from [facility]   Epic    Within the past 12 months, you worried that your food would run out before you got the money to buy Kelly.: Often true    Within the past 12 months, the food you bought just didn't last and you didn't have money to get Kelly.: Often true  Transportation Needs: No Transportation Needs (01/15/2024)   Received from [facility] - Transportation    In the past 12 months, has lack of transportation kept you from medical appointments or from getting medications?: No    Lack of Transportation (Non-Medical): No  Physical Activity: Not on file  Stress: Not on file   Social Connections: Not on file  Intimate Partner Violence: Not on file  Depression (PHQ2-9): Low Risk (07/16/2023)   Depression (PHQ2-9)    PHQ-2 Score: 0  Alcohol Screen: Not on file  Housing: High Risk (01/15/2024)   Received from [facility]   Epic    In the last 12 months, was there a time when you were not able to pay the mortgage or rent on time?: Yes    In the past 12 months, how many times have you moved where you were living?: 0    At any time in the past 12 months, were you homeless or living in a shelter (including now)?: No  Utilities: At Risk (01/15/2024)  Received from [facility]   Epic    In the past 12 months has the electric, gas, oil, or water company threatened to shut off services in your home?: Yes  Health Literacy: Not on file    Review of Systems: See HPI, otherwise negative ROS  Physical Exam: BP (!) 134/96   Pulse 66   Temp (!) 97 F (36.1 C) (Temporal)   Resp 14   Ht 6' 3.5 (1.918 m)   Wt 286 lb 6.4 oz (129.9 kg)   SpO2 96%   BMI 35.32 kg/m  General:   Alert,  pleasant and cooperative in NAD Head:  Normocephalic and atraumatic. Neck:  Supple; no masses or thyromegaly. Lungs:  Clear throughout to auscultation.    Heart:  Regular rate and rhythm. Abdomen:  Soft, nontender and nondistended. Normal bowel sounds, without guarding, and without rebound.   Neurologic:  Alert and  oriented x4;  grossly normal neurologically.  Impression/Plan: Casey Kelly is here for an flexible sigmoidoscopy to be performed for crohn's colitis  Risks, benefits, limitations, and alternatives regarding  flexible sigmoidoscopy have been reviewed with the patient.  Questions have been answered.  All parties agreeable.   Corinn Brooklyn, MD  05/04/2024, 10:47 AM "

## 2024-05-05 LAB — SURGICAL PATHOLOGY

## 2024-05-06 ENCOUNTER — Ambulatory Visit: Payer: Self-pay | Admitting: Gastroenterology

## 2024-05-20 ENCOUNTER — Other Ambulatory Visit: Payer: Self-pay

## 2024-05-21 ENCOUNTER — Other Ambulatory Visit: Payer: Self-pay

## 2024-05-21 MED ORDER — TREMFYA PEN 200 MG/2ML ~~LOC~~ SOAJ
SUBCUTANEOUS | 6 refills | Status: AC
  Filled 2024-05-24 – 2024-05-25 (×5): qty 2, 28d supply, fill #0

## 2024-05-24 ENCOUNTER — Other Ambulatory Visit: Payer: Self-pay

## 2024-05-25 ENCOUNTER — Encounter (INDEPENDENT_AMBULATORY_CARE_PROVIDER_SITE_OTHER): Payer: Self-pay

## 2024-05-25 ENCOUNTER — Other Ambulatory Visit: Payer: Self-pay

## 2024-05-26 ENCOUNTER — Other Ambulatory Visit (HOSPITAL_COMMUNITY): Payer: Self-pay

## 2024-05-26 ENCOUNTER — Other Ambulatory Visit: Payer: Self-pay

## 2024-05-26 ENCOUNTER — Other Ambulatory Visit: Payer: Self-pay | Admitting: Pharmacist

## 2024-05-26 NOTE — Progress Notes (Signed)
 Clinical Intervention Note  Clinical Intervention Notes: patient reported starting L-Glutamine powder. No DDI's identified with Tremfya .   Clinical Intervention Outcomes: Prevention of an adverse drug event   Lyle LELON Chalk Specialty Pharmacist

## 2024-05-26 NOTE — Progress Notes (Signed)
 Specialty Pharmacy Refill Coordination Note  Casey Kelly is a 49 y.o. male contacted today regarding refills of specialty medication(s) Guselkumab  (Tremfya  Pen)   Patient requested [name] at [facility] at [place] date: 05/30/24   Medication will be filled on: 05/27/24

## 2024-05-27 ENCOUNTER — Other Ambulatory Visit: Payer: Self-pay
